# Patient Record
Sex: Male | Born: 1955 | Race: White | Hispanic: No | Marital: Married | Smoking: Former smoker
Health system: Southern US, Community
[De-identification: ages and names within clinical notes are randomized; demographics above are authoritative.]

## PROBLEM LIST (undated history)

## (undated) DIAGNOSIS — Z8739 Personal history of other diseases of the musculoskeletal system and connective tissue: Secondary | ICD-10-CM

## (undated) DIAGNOSIS — E7800 Pure hypercholesterolemia, unspecified: Secondary | ICD-10-CM

## (undated) DIAGNOSIS — I10 Essential (primary) hypertension: Secondary | ICD-10-CM

## (undated) DIAGNOSIS — G4730 Sleep apnea, unspecified: Secondary | ICD-10-CM

## (undated) DIAGNOSIS — D751 Secondary polycythemia: Secondary | ICD-10-CM

## (undated) DIAGNOSIS — I499 Cardiac arrhythmia, unspecified: Secondary | ICD-10-CM

## (undated) DIAGNOSIS — T7840XA Allergy, unspecified, initial encounter: Secondary | ICD-10-CM

## (undated) HISTORY — DX: Sleep apnea, unspecified: G47.30

## (undated) HISTORY — DX: Secondary polycythemia: D75.1

## (undated) HISTORY — DX: Personal history of other diseases of the musculoskeletal system and connective tissue: Z87.39

## (undated) HISTORY — DX: Essential (primary) hypertension: I10

## (undated) HISTORY — DX: Allergy, unspecified, initial encounter: T78.40XA

## (undated) HISTORY — DX: Pure hypercholesterolemia, unspecified: E78.00

---
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
## 2003-11-30 HISTORY — PX: MANDIBLE SURGERY: SHX707

## 2004-11-29 HISTORY — PX: COLONOSCOPY: SHX174

## 2006-05-19 DIAGNOSIS — E7800 Pure hypercholesterolemia, unspecified: Secondary | ICD-10-CM | POA: Insufficient documentation

## 2007-04-19 DIAGNOSIS — D751 Secondary polycythemia: Secondary | ICD-10-CM | POA: Insufficient documentation

## 2007-04-19 DIAGNOSIS — I10 Essential (primary) hypertension: Secondary | ICD-10-CM | POA: Insufficient documentation

## 2007-04-21 DIAGNOSIS — N529 Male erectile dysfunction, unspecified: Secondary | ICD-10-CM | POA: Insufficient documentation

## 2007-04-21 DIAGNOSIS — F17200 Nicotine dependence, unspecified, uncomplicated: Secondary | ICD-10-CM | POA: Insufficient documentation

## 2007-04-30 ENCOUNTER — Ambulatory Visit: Payer: Self-pay | Admitting: Internal Medicine

## 2007-05-02 ENCOUNTER — Ambulatory Visit: Payer: Self-pay | Admitting: Internal Medicine

## 2007-05-30 ENCOUNTER — Ambulatory Visit: Payer: Self-pay | Admitting: Internal Medicine

## 2007-06-30 ENCOUNTER — Ambulatory Visit: Payer: Self-pay | Admitting: Internal Medicine

## 2007-07-31 ENCOUNTER — Ambulatory Visit: Payer: Self-pay | Admitting: Internal Medicine

## 2007-08-30 ENCOUNTER — Ambulatory Visit: Payer: Self-pay | Admitting: Internal Medicine

## 2007-09-30 ENCOUNTER — Ambulatory Visit: Payer: Self-pay | Admitting: Internal Medicine

## 2007-10-30 ENCOUNTER — Ambulatory Visit: Payer: Self-pay | Admitting: Internal Medicine

## 2007-11-30 ENCOUNTER — Ambulatory Visit: Payer: Self-pay | Admitting: Internal Medicine

## 2007-12-31 ENCOUNTER — Ambulatory Visit: Payer: Self-pay | Admitting: Internal Medicine

## 2008-01-28 ENCOUNTER — Ambulatory Visit: Payer: Self-pay | Admitting: Internal Medicine

## 2008-02-28 ENCOUNTER — Ambulatory Visit: Payer: Self-pay | Admitting: Internal Medicine

## 2008-03-29 ENCOUNTER — Ambulatory Visit: Payer: Self-pay | Admitting: Internal Medicine

## 2008-04-29 ENCOUNTER — Ambulatory Visit: Payer: Self-pay | Admitting: Internal Medicine

## 2008-05-20 ENCOUNTER — Ambulatory Visit: Payer: Self-pay | Admitting: Internal Medicine

## 2008-05-29 ENCOUNTER — Ambulatory Visit: Payer: Self-pay | Admitting: Internal Medicine

## 2008-06-29 ENCOUNTER — Ambulatory Visit: Payer: Self-pay | Admitting: Internal Medicine

## 2008-07-01 ENCOUNTER — Ambulatory Visit: Payer: Self-pay | Admitting: Internal Medicine

## 2008-07-30 ENCOUNTER — Ambulatory Visit: Payer: Self-pay | Admitting: Internal Medicine

## 2008-08-27 ENCOUNTER — Ambulatory Visit: Payer: Self-pay | Admitting: Internal Medicine

## 2008-08-29 ENCOUNTER — Ambulatory Visit: Payer: Self-pay | Admitting: Nurse Practitioner

## 2008-08-29 ENCOUNTER — Ambulatory Visit: Payer: Self-pay | Admitting: Internal Medicine

## 2008-09-03 IMAGING — US ABDOMEN ULTRASOUND
1 series · 17 of 25 positions shown · non-contrast
Comparison: none

REASON FOR EXAM: polycythemia   cyst of spleen
COMMENTS:

[Series 1: abdomen ultrasound · 17 of 67 slices shown]
[im 1/67]
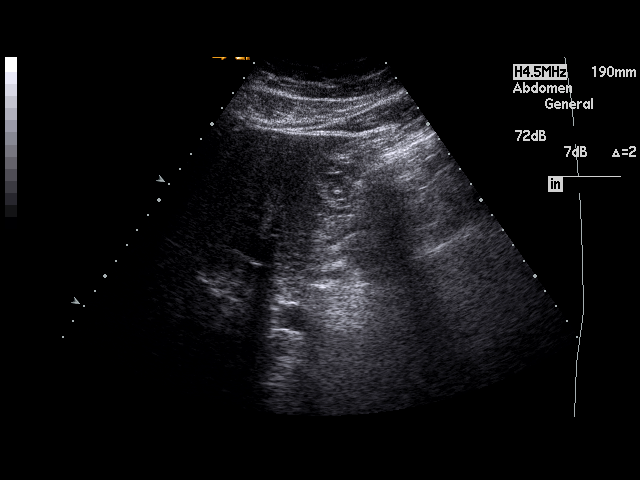
[im 6/67]
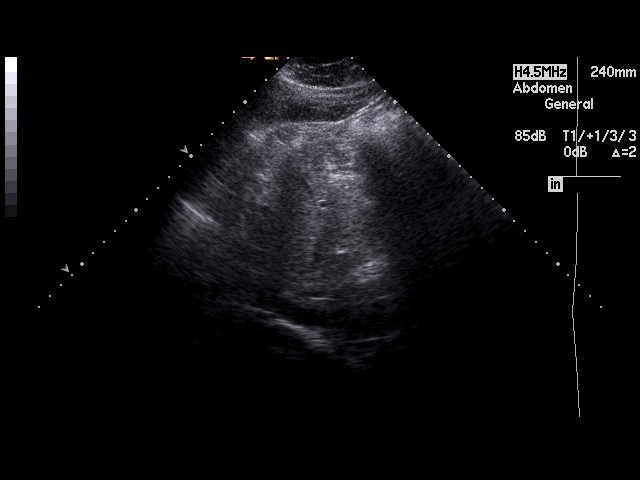
[im 9/67]
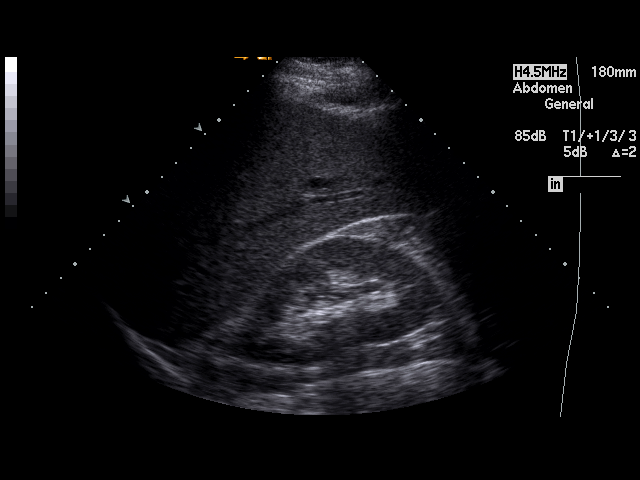
[im 14/67]
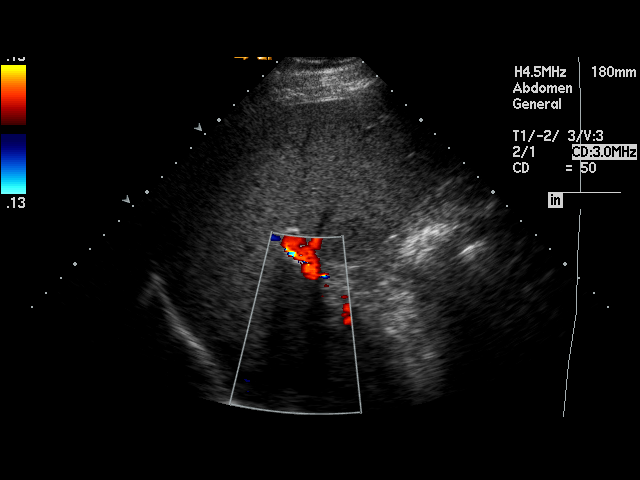
[im 17/67]
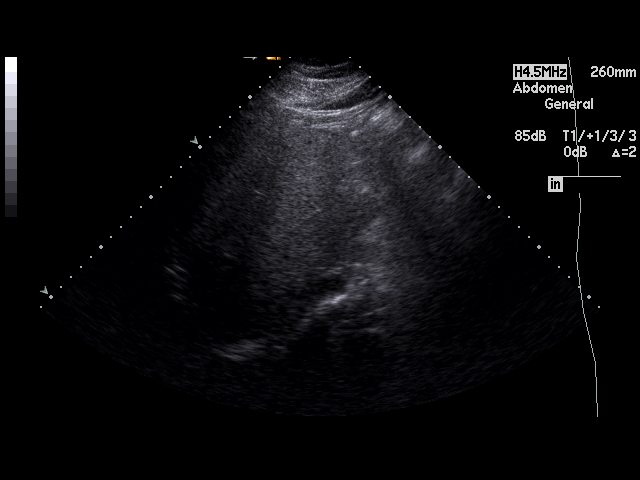
[im 23/67]
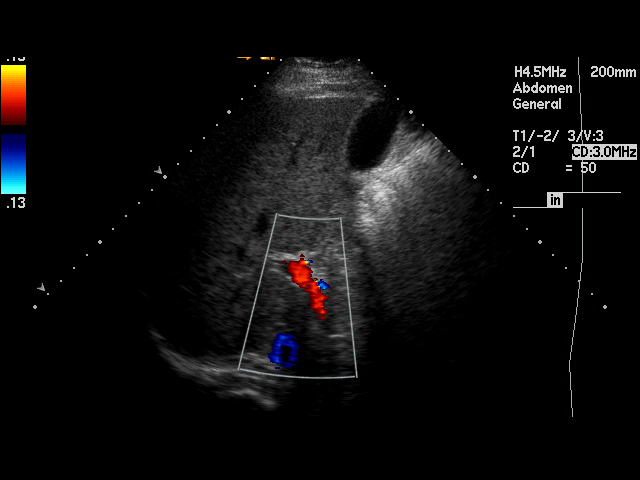
[im 25/67]
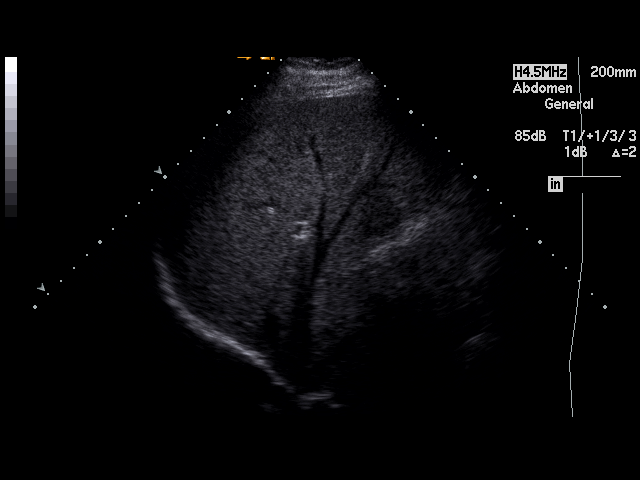
[im 31/67]
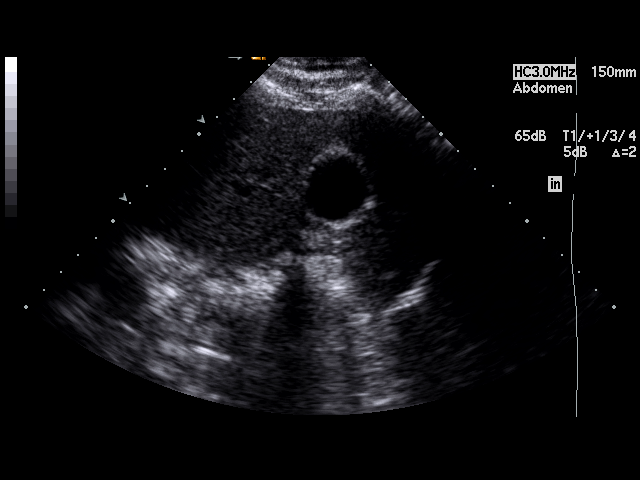
[im 34/67]
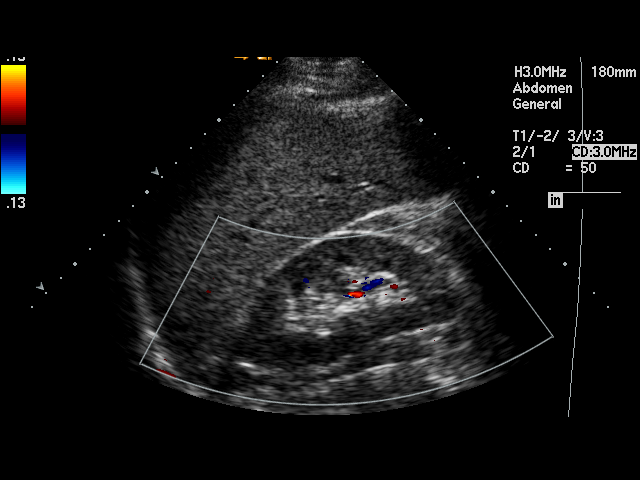
[im 36/67]
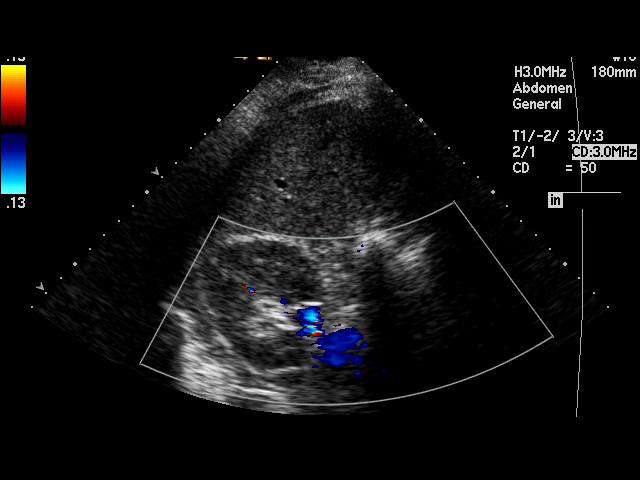
[im 42/67]
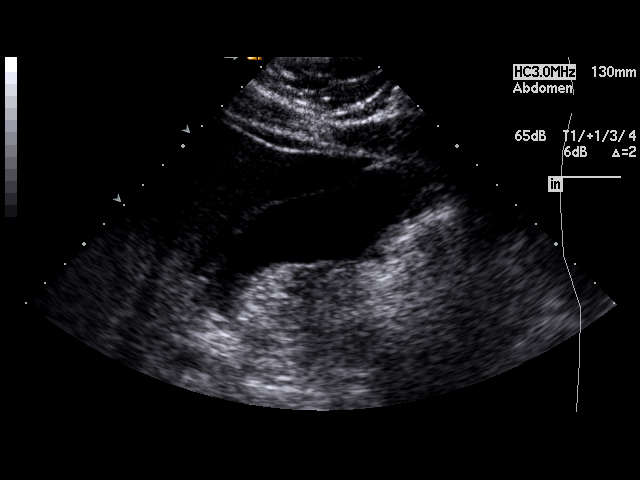
[im 45/67]
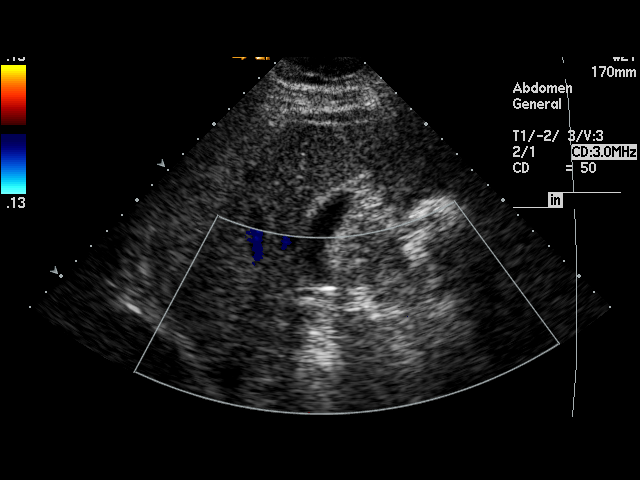
[im 50/67]
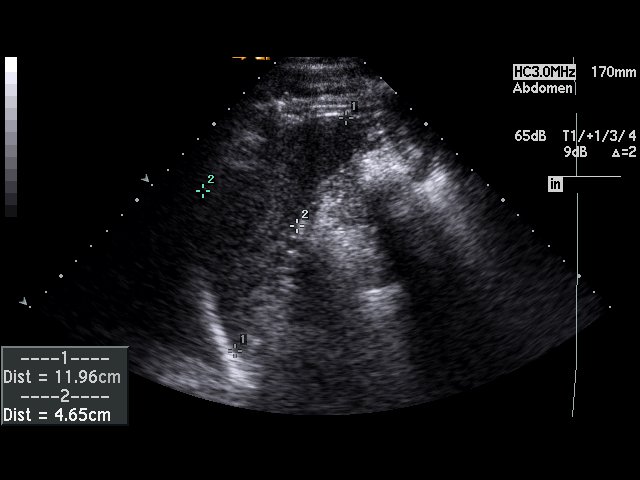
[im 53/67]
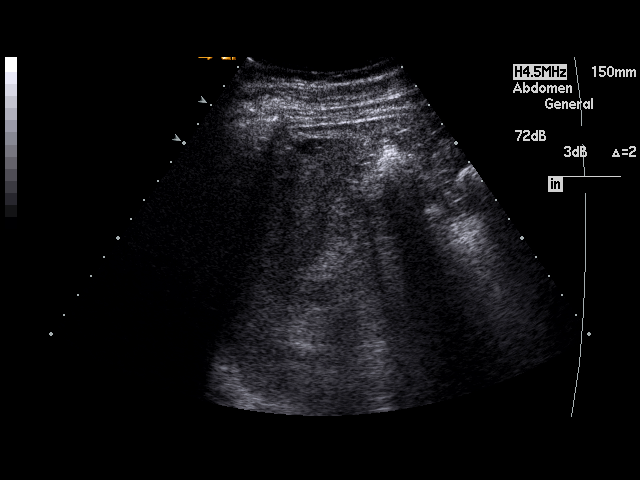
[im 58/67]
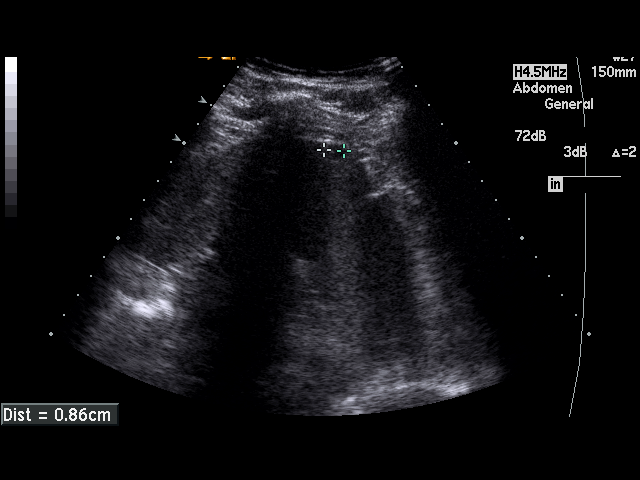
[im 61/67]
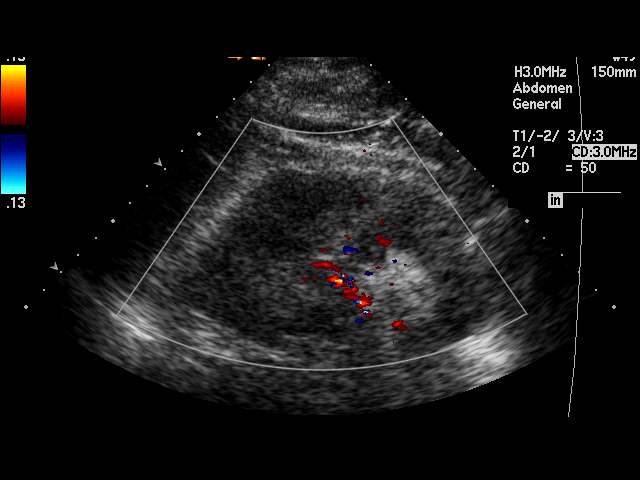
[im 67/67]
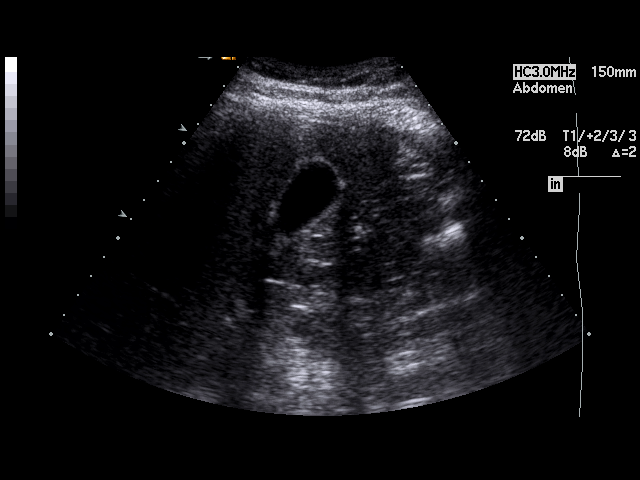

[17 of 25 positions shown; findings below may reference images not displayed]

PROCEDURE:     US  - US ABDOMEN GENERAL SURVEY  - December 21, 2007  [DATE]

RESULT:     The liver, inferior vena cava and abdominal aorta show no
significant abnormalities. The spleen is within normal limits for size. On
this exam the spleen measures 11.96 cm at maximum AP diameter. A 1 cm
splenic cyst is again seen. No gallstones are noted. There is no thickening
of the gallbladder wall. The common bile duct measures 4.2 mm in diameter
which is within normal limits.  The kidneys show no hydronephrosis. There is
no ascites.
IMPRESSION: 1. Normal study except for an incidentally noted stable, 1 cm splenic cyst.
2. The pancreas is not visualized adequately for evaluation on this exam.
3. Spleen size is normal.
4. No gallstones are noted.

## 2008-09-29 ENCOUNTER — Ambulatory Visit: Payer: Self-pay | Admitting: Nurse Practitioner

## 2008-09-29 ENCOUNTER — Ambulatory Visit: Payer: Self-pay | Admitting: Internal Medicine

## 2008-10-29 ENCOUNTER — Ambulatory Visit: Payer: Self-pay | Admitting: Internal Medicine

## 2008-11-29 ENCOUNTER — Ambulatory Visit: Payer: Self-pay | Admitting: Internal Medicine

## 2008-12-30 ENCOUNTER — Ambulatory Visit: Payer: Self-pay | Admitting: Internal Medicine

## 2009-01-27 ENCOUNTER — Ambulatory Visit: Payer: Self-pay | Admitting: Internal Medicine

## 2009-02-17 DIAGNOSIS — N400 Enlarged prostate without lower urinary tract symptoms: Secondary | ICD-10-CM | POA: Insufficient documentation

## 2009-02-17 DIAGNOSIS — J301 Allergic rhinitis due to pollen: Secondary | ICD-10-CM | POA: Insufficient documentation

## 2009-02-27 ENCOUNTER — Ambulatory Visit: Payer: Self-pay | Admitting: Internal Medicine

## 2009-03-29 ENCOUNTER — Ambulatory Visit: Payer: Self-pay | Admitting: Internal Medicine

## 2009-05-29 ENCOUNTER — Ambulatory Visit: Payer: Self-pay | Admitting: Internal Medicine

## 2009-06-12 ENCOUNTER — Ambulatory Visit: Payer: Self-pay | Admitting: Internal Medicine

## 2009-06-29 ENCOUNTER — Ambulatory Visit: Payer: Self-pay | Admitting: Internal Medicine

## 2009-07-30 ENCOUNTER — Ambulatory Visit: Payer: Self-pay | Admitting: Internal Medicine

## 2009-08-29 ENCOUNTER — Ambulatory Visit: Payer: Self-pay | Admitting: Internal Medicine

## 2009-09-04 ENCOUNTER — Ambulatory Visit: Payer: Self-pay | Admitting: Internal Medicine

## 2009-09-29 ENCOUNTER — Ambulatory Visit: Payer: Self-pay | Admitting: Internal Medicine

## 2009-10-29 ENCOUNTER — Ambulatory Visit: Payer: Self-pay | Admitting: Internal Medicine

## 2009-11-29 ENCOUNTER — Ambulatory Visit: Payer: Self-pay | Admitting: Internal Medicine

## 2009-12-15 ENCOUNTER — Ambulatory Visit: Payer: Self-pay | Admitting: Internal Medicine

## 2009-12-30 ENCOUNTER — Ambulatory Visit: Payer: Self-pay | Admitting: Internal Medicine

## 2010-01-30 ENCOUNTER — Ambulatory Visit: Payer: Self-pay | Admitting: Internal Medicine

## 2010-02-27 ENCOUNTER — Ambulatory Visit: Payer: Self-pay | Admitting: Internal Medicine

## 2010-03-29 ENCOUNTER — Ambulatory Visit: Payer: Self-pay | Admitting: Internal Medicine

## 2010-05-19 ENCOUNTER — Ambulatory Visit: Payer: Self-pay | Admitting: Internal Medicine

## 2010-05-29 ENCOUNTER — Ambulatory Visit: Payer: Self-pay | Admitting: Internal Medicine

## 2010-07-14 ENCOUNTER — Ambulatory Visit: Payer: Self-pay | Admitting: Internal Medicine

## 2010-07-30 ENCOUNTER — Ambulatory Visit: Payer: Self-pay | Admitting: Internal Medicine

## 2010-08-29 ENCOUNTER — Ambulatory Visit: Payer: Self-pay | Admitting: Internal Medicine

## 2010-09-29 ENCOUNTER — Ambulatory Visit: Payer: Self-pay | Admitting: Internal Medicine

## 2010-10-29 ENCOUNTER — Ambulatory Visit: Payer: Self-pay | Admitting: Internal Medicine

## 2010-11-29 ENCOUNTER — Ambulatory Visit: Payer: Self-pay | Admitting: Internal Medicine

## 2010-12-30 ENCOUNTER — Ambulatory Visit: Payer: Self-pay | Admitting: Internal Medicine

## 2011-01-28 ENCOUNTER — Ambulatory Visit: Payer: Self-pay | Admitting: Internal Medicine

## 2011-02-28 ENCOUNTER — Ambulatory Visit: Payer: Self-pay | Admitting: Internal Medicine

## 2011-03-29 LAB — PSA: PSA: 0.8

## 2011-03-30 ENCOUNTER — Ambulatory Visit: Payer: Self-pay | Admitting: Internal Medicine

## 2011-04-30 ENCOUNTER — Ambulatory Visit: Payer: Self-pay | Admitting: Internal Medicine

## 2011-05-30 ENCOUNTER — Ambulatory Visit: Payer: Self-pay | Admitting: Internal Medicine

## 2011-06-30 ENCOUNTER — Ambulatory Visit: Payer: Self-pay | Admitting: Internal Medicine

## 2011-07-31 ENCOUNTER — Ambulatory Visit: Payer: Self-pay | Admitting: Internal Medicine

## 2011-08-30 ENCOUNTER — Ambulatory Visit: Payer: Self-pay | Admitting: Internal Medicine

## 2011-09-30 ENCOUNTER — Ambulatory Visit: Payer: Self-pay | Admitting: Internal Medicine

## 2011-10-30 ENCOUNTER — Ambulatory Visit: Payer: Self-pay | Admitting: Internal Medicine

## 2011-11-30 ENCOUNTER — Ambulatory Visit: Payer: Self-pay | Admitting: Internal Medicine

## 2011-12-10 LAB — CANCER CENTER HEMATOCRIT: HCT: 48.9 % (ref 40.0–52.0)

## 2011-12-31 ENCOUNTER — Ambulatory Visit: Payer: Self-pay | Admitting: Internal Medicine

## 2011-12-31 LAB — CBC CANCER CENTER
Eosinophil #: 0.2 x10 3/mm (ref 0.0–0.7)
Eosinophil %: 1.8 %
HGB: 16.5 g/dL (ref 13.0–18.0)
Lymphocyte #: 2.3 x10 3/mm (ref 1.0–3.6)
MCH: 28.6 pg (ref 26.0–34.0)
MCV: 87 fL (ref 80–100)
Monocyte #: 0.6 x10 3/mm (ref 0.0–0.7)
Neutrophil #: 5.9 x10 3/mm (ref 1.4–6.5)
Neutrophil %: 65 %
Platelet: 162 x10 3/mm (ref 150–440)
RBC: 5.78 10*6/uL (ref 4.40–5.90)
RDW: 14.4 % (ref 11.5–14.5)

## 2012-01-28 ENCOUNTER — Ambulatory Visit: Payer: Self-pay | Admitting: Internal Medicine

## 2012-02-28 ENCOUNTER — Ambulatory Visit: Payer: Self-pay | Admitting: Internal Medicine

## 2012-03-17 LAB — CBC CANCER CENTER
Eosinophil #: 0.1 x10 3/mm (ref 0.0–0.7)
Lymphocyte %: 23.1 %
MCH: 26.9 pg (ref 26.0–34.0)
MCHC: 31.4 g/dL — ABNORMAL LOW (ref 32.0–36.0)
MCV: 86 fL (ref 80–100)
Monocyte #: 0.7 x10 3/mm (ref 0.2–1.0)
Neutrophil #: 5.3 x10 3/mm (ref 1.4–6.5)
Platelet: 145 x10 3/mm — ABNORMAL LOW (ref 150–440)
RBC: 5.88 10*6/uL (ref 4.40–5.90)
RDW: 15.6 % — ABNORMAL HIGH (ref 11.5–14.5)
WBC: 8.1 x10 3/mm (ref 3.8–10.6)

## 2012-03-29 ENCOUNTER — Ambulatory Visit: Payer: Self-pay | Admitting: Internal Medicine

## 2012-04-05 LAB — CANCER CENTER HEMATOCRIT: HCT: 47.3 % (ref 40.0–52.0)

## 2012-04-29 ENCOUNTER — Ambulatory Visit: Payer: Self-pay | Admitting: Internal Medicine

## 2012-05-03 LAB — CBC CANCER CENTER
Basophil #: 0.1 x10 3/mm (ref 0.0–0.1)
HGB: 15.7 g/dL (ref 13.0–18.0)
MCH: 27 pg (ref 26.0–34.0)
MCHC: 32 g/dL (ref 32.0–36.0)
MCV: 84 fL (ref 80–100)
Monocyte #: 0.8 x10 3/mm (ref 0.2–1.0)
Neutrophil %: 67.6 %
Platelet: 158 x10 3/mm (ref 150–440)
RBC: 5.83 10*6/uL (ref 4.40–5.90)

## 2012-05-29 ENCOUNTER — Ambulatory Visit: Payer: Self-pay | Admitting: Internal Medicine

## 2012-06-29 ENCOUNTER — Ambulatory Visit: Payer: Self-pay | Admitting: Internal Medicine

## 2012-07-30 ENCOUNTER — Ambulatory Visit: Payer: Self-pay | Admitting: Internal Medicine

## 2012-08-18 ENCOUNTER — Ambulatory Visit: Payer: Self-pay | Admitting: Family Medicine

## 2012-08-18 LAB — COMPREHENSIVE METABOLIC PANEL
Anion Gap: 7 (ref 7–16)
BUN: 11 mg/dL (ref 7–18)
Bilirubin,Total: 0.5 mg/dL (ref 0.2–1.0)
Chloride: 101 mmol/L (ref 98–107)
Co2: 30 mmol/L (ref 21–32)
Creatinine: 0.83 mg/dL (ref 0.60–1.30)
EGFR (African American): >60
EGFR (Non-African Amer.): >60
Osmolality: 275 (ref 275–301)
Potassium: 4.3 mmol/L (ref 3.5–5.1)
SGPT (ALT): 19 U/L (ref 12–78)
Sodium: 138 mmol/L (ref 136–145)

## 2012-08-18 LAB — CBC WITH DIFFERENTIAL/PLATELET
Basophil #: 0 10*3/uL (ref 0.0–0.1)
HCT: 47.1 % (ref 40.0–52.0)
HGB: 15.4 g/dL (ref 13.0–18.0)
Lymphocyte #: 1.7 10*3/uL (ref 1.0–3.6)
Lymphocyte %: 11.5 %
MCV: 84 fL (ref 80–100)
Monocyte %: 7.3 %
Neutrophil %: 79.8 %
Platelet: 180 10*3/uL (ref 150–440)
RDW: 15.8 % — ABNORMAL HIGH (ref 11.5–14.5)
WBC: 14.9 10*3/uL — ABNORMAL HIGH (ref 3.8–10.6)

## 2012-09-01 ENCOUNTER — Ambulatory Visit: Payer: Self-pay | Admitting: Internal Medicine

## 2012-09-01 LAB — CANCER CENTER HEMATOCRIT: HCT: 48.6 % (ref 40.0–52.0)

## 2012-09-20 LAB — CANCER CENTER HEMATOCRIT: HCT: 46.7 % (ref 40.0–52.0)

## 2012-09-29 ENCOUNTER — Ambulatory Visit: Payer: Self-pay

## 2012-09-29 ENCOUNTER — Ambulatory Visit: Payer: Self-pay | Admitting: Internal Medicine

## 2012-10-18 LAB — CBC CANCER CENTER
Eosinophil #: 0.2 x10 3/mm (ref 0.0–0.7)
Eosinophil %: 1.8 %
HCT: 48.1 % (ref 40.0–52.0)
Lymphocyte #: 2 x10 3/mm (ref 1.0–3.6)
Lymphocyte %: 22.1 %
MCH: 26.6 pg (ref 26.0–34.0)
MCHC: 31.1 g/dL — ABNORMAL LOW (ref 32.0–36.0)
MCV: 86 fL (ref 80–100)
Monocyte #: 0.6 x10 3/mm (ref 0.2–1.0)
RBC: 5.62 10*6/uL (ref 4.40–5.90)
RDW: 15.8 % — ABNORMAL HIGH (ref 11.5–14.5)
WBC: 8.8 x10 3/mm (ref 3.8–10.6)

## 2012-10-29 ENCOUNTER — Ambulatory Visit: Payer: Self-pay | Admitting: Internal Medicine

## 2012-11-20 LAB — CBC CANCER CENTER
Basophil #: 0.1 x10 3/mm (ref 0.0–0.1)
HCT: 45.5 % (ref 40.0–52.0)
Lymphocyte %: 30.9 %
Monocyte %: 15 %
Platelet: 133 x10 3/mm — ABNORMAL LOW (ref 150–440)
RBC: 5.51 10*6/uL (ref 4.40–5.90)
RDW: 15.7 % — ABNORMAL HIGH (ref 11.5–14.5)

## 2012-11-29 ENCOUNTER — Ambulatory Visit: Payer: Self-pay | Admitting: Internal Medicine

## 2012-12-20 LAB — CBC CANCER CENTER
Eosinophil #: 0.2 x10 3/mm (ref 0.0–0.7)
HCT: 49.8 % (ref 40.0–52.0)
HGB: 16.3 g/dL (ref 13.0–18.0)
Lymphocyte %: 19.2 %
MCH: 26.8 pg (ref 26.0–34.0)
MCHC: 32.6 g/dL (ref 32.0–36.0)
MCV: 82 fL (ref 80–100)
Neutrophil %: 67.4 %
Platelet: 146 x10 3/mm — ABNORMAL LOW (ref 150–440)
RBC: 6.06 10*6/uL — ABNORMAL HIGH (ref 4.40–5.90)
WBC: 10.7 x10 3/mm — ABNORMAL HIGH (ref 3.8–10.6)

## 2012-12-30 ENCOUNTER — Ambulatory Visit: Payer: Self-pay | Admitting: Internal Medicine

## 2013-01-15 LAB — CBC CANCER CENTER
Basophil #: 0.1 x10 3/mm (ref 0.0–0.1)
Basophil %: 1.1 %
HCT: 48.3 % (ref 40.0–52.0)
HGB: 15.8 g/dL (ref 13.0–18.0)
Lymphocyte #: 1.9 x10 3/mm (ref 1.0–3.6)
Lymphocyte %: 20.5 %
MCHC: 32.7 g/dL (ref 32.0–36.0)
MCV: 83 fL (ref 80–100)
Monocyte #: 0.7 x10 3/mm (ref 0.2–1.0)
Neutrophil #: 6.4 x10 3/mm (ref 1.4–6.5)
Neutrophil %: 69.4 %
RDW: 16.3 % — ABNORMAL HIGH (ref 11.5–14.5)
WBC: 9.1 x10 3/mm (ref 3.8–10.6)

## 2013-01-27 ENCOUNTER — Ambulatory Visit: Payer: Self-pay | Admitting: Internal Medicine

## 2013-02-26 LAB — CANCER CENTER HEMATOCRIT: HCT: 51.4 % (ref 40.0–52.0)

## 2013-02-27 ENCOUNTER — Ambulatory Visit: Payer: Self-pay | Admitting: Internal Medicine

## 2013-02-27 LAB — CANCER CTR PLATELET CT: Platelet: 177 x10 3/mm (ref 150–440)

## 2013-03-29 ENCOUNTER — Ambulatory Visit: Payer: Self-pay | Admitting: Internal Medicine

## 2013-04-26 LAB — CANCER CENTER HEMATOCRIT: HCT: 48.6 % (ref 40.0–52.0)

## 2013-04-29 ENCOUNTER — Ambulatory Visit: Payer: Self-pay | Admitting: Internal Medicine

## 2013-05-29 ENCOUNTER — Ambulatory Visit: Payer: Self-pay | Admitting: Internal Medicine

## 2013-06-11 LAB — CBC CANCER CENTER
Basophil %: 1.1 %
Eosinophil #: 0.2 x10 3/mm (ref 0.0–0.7)
Eosinophil %: 1.6 %
HCT: 48.2 % (ref 40.0–52.0)
HGB: 15.9 g/dL (ref 13.0–18.0)
Lymphocyte #: 2.1 x10 3/mm (ref 1.0–3.6)
MCH: 27.3 pg (ref 26.0–34.0)
MCHC: 33 g/dL (ref 32.0–36.0)
Monocyte %: 9.3 %
Neutrophil %: 66.4 %
Platelet: 180 x10 3/mm (ref 150–440)
RBC: 5.83 10*6/uL (ref 4.40–5.90)

## 2013-06-29 ENCOUNTER — Ambulatory Visit: Payer: Self-pay | Admitting: Internal Medicine

## 2013-07-11 LAB — CANCER CENTER HEMATOCRIT: HCT: 47.4 % (ref 40.0–52.0)

## 2013-07-30 ENCOUNTER — Ambulatory Visit: Payer: Self-pay | Admitting: Internal Medicine

## 2013-08-22 LAB — CANCER CENTER HEMATOCRIT: HCT: 46.5 % (ref 40.0–52.0)

## 2013-08-29 ENCOUNTER — Ambulatory Visit: Payer: Self-pay | Admitting: Internal Medicine

## 2013-09-19 LAB — CANCER CENTER HEMATOCRIT: HCT: 48.7 % (ref 40.0–52.0)

## 2013-09-29 ENCOUNTER — Ambulatory Visit: Payer: Self-pay | Admitting: Internal Medicine

## 2013-10-31 ENCOUNTER — Ambulatory Visit: Payer: Self-pay | Admitting: Internal Medicine

## 2013-10-31 LAB — CANCER CENTER HEMATOCRIT: HCT: 48.3 % (ref 40.0–52.0)

## 2013-11-29 ENCOUNTER — Ambulatory Visit: Payer: Self-pay | Admitting: Internal Medicine

## 2013-11-29 HISTORY — PX: CATARACT EXTRACTION: SUR2

## 2013-12-05 LAB — CBC CANCER CENTER
BASOS PCT: 1.2 %
Basophil #: 0.1 x10 3/mm (ref 0.0–0.1)
Eosinophil #: 0.2 x10 3/mm (ref 0.0–0.7)
Eosinophil %: 2.2 %
HCT: 48 % (ref 40.0–52.0)
HGB: 15.3 g/dL (ref 13.0–18.0)
Lymphocyte #: 2 x10 3/mm (ref 1.0–3.6)
Lymphocyte %: 23.7 %
MCH: 26.4 pg (ref 26.0–34.0)
MCHC: 31.9 g/dL — AB (ref 32.0–36.0)
MCV: 83 fL (ref 80–100)
MONOS PCT: 10 %
Monocyte #: 0.8 x10 3/mm (ref 0.2–1.0)
NEUTROS PCT: 62.9 %
Neutrophil #: 5.2 x10 3/mm (ref 1.4–6.5)
PLATELETS: 153 x10 3/mm (ref 150–440)
RBC: 5.8 10*6/uL (ref 4.40–5.90)
RDW: 16.1 % — ABNORMAL HIGH (ref 11.5–14.5)
WBC: 8.3 x10 3/mm (ref 3.8–10.6)

## 2013-12-30 ENCOUNTER — Ambulatory Visit: Payer: Self-pay | Admitting: Internal Medicine

## 2014-01-03 LAB — CANCER CENTER HEMATOCRIT: HCT: 50.8 % (ref 40.0–52.0)

## 2014-01-27 ENCOUNTER — Ambulatory Visit: Payer: Self-pay | Admitting: Internal Medicine

## 2014-02-08 LAB — CBC CANCER CENTER
BASOS ABS: 0.1 x10 3/mm (ref 0.0–0.1)
BASOS PCT: 0.9 %
EOS ABS: 0.2 x10 3/mm (ref 0.0–0.7)
Eosinophil %: 2.3 %
HCT: 48 % (ref 40.0–52.0)
HGB: 15.2 g/dL (ref 13.0–18.0)
LYMPHS ABS: 1.9 x10 3/mm (ref 1.0–3.6)
Lymphocyte %: 20.8 %
MCH: 26.5 pg (ref 26.0–34.0)
MCHC: 31.6 g/dL — ABNORMAL LOW (ref 32.0–36.0)
MCV: 84 fL (ref 80–100)
MONO ABS: 0.6 x10 3/mm (ref 0.2–1.0)
MONOS PCT: 6.8 %
NEUTROS PCT: 69.2 %
Neutrophil #: 6.2 x10 3/mm (ref 1.4–6.5)
PLATELETS: 141 x10 3/mm — AB (ref 150–440)
RBC: 5.72 10*6/uL (ref 4.40–5.90)
RDW: 16 % — ABNORMAL HIGH (ref 11.5–14.5)
WBC: 8.9 x10 3/mm (ref 3.8–10.6)

## 2014-02-27 ENCOUNTER — Ambulatory Visit: Payer: Self-pay | Admitting: Internal Medicine

## 2014-03-08 LAB — CANCER CTR PLATELET CT: Platelet: 130 x10 3/mm — ABNORMAL LOW (ref 150–440)

## 2014-03-08 LAB — CANCER CENTER HEMATOCRIT: HCT: 46.8 % (ref 40.0–52.0)

## 2014-03-29 ENCOUNTER — Ambulatory Visit: Payer: Self-pay | Admitting: Internal Medicine

## 2014-04-18 LAB — CANCER CENTER HEMATOCRIT: HCT: 46.9 % (ref 40.0–52.0)

## 2014-04-29 ENCOUNTER — Ambulatory Visit: Payer: Self-pay | Admitting: Internal Medicine

## 2014-06-03 ENCOUNTER — Ambulatory Visit: Payer: Self-pay | Admitting: Internal Medicine

## 2014-06-04 LAB — CBC CANCER CENTER
Basophil #: 0.1 x10 3/mm (ref 0.0–0.1)
Basophil %: 1 %
EOS PCT: 1.9 %
Eosinophil #: 0.2 x10 3/mm (ref 0.0–0.7)
HCT: 50.4 % (ref 40.0–52.0)
HGB: 16.3 g/dL (ref 13.0–18.0)
Lymphocyte #: 2.2 x10 3/mm (ref 1.0–3.6)
Lymphocyte %: 25.2 %
MCH: 27.9 pg (ref 26.0–34.0)
MCHC: 32.4 g/dL (ref 32.0–36.0)
MCV: 86 fL (ref 80–100)
Monocyte #: 0.8 x10 3/mm (ref 0.2–1.0)
Monocyte %: 8.8 %
NEUTROS ABS: 5.6 x10 3/mm (ref 1.4–6.5)
NEUTROS PCT: 63.1 %
Platelet: 136 x10 3/mm — ABNORMAL LOW (ref 150–440)
RBC: 5.86 10*6/uL (ref 4.40–5.90)
RDW: 18 % — AB (ref 11.5–14.5)
WBC: 8.9 x10 3/mm (ref 3.8–10.6)

## 2014-06-29 ENCOUNTER — Ambulatory Visit: Payer: Self-pay | Admitting: Internal Medicine

## 2014-07-04 LAB — CANCER CENTER HEMATOCRIT: HCT: 49.9 % (ref 40.0–52.0)

## 2014-07-18 ENCOUNTER — Encounter: Payer: Self-pay | Admitting: General Surgery

## 2014-07-22 ENCOUNTER — Ambulatory Visit: Payer: Self-pay | Admitting: General Surgery

## 2014-07-26 ENCOUNTER — Encounter: Payer: Self-pay | Admitting: General Surgery

## 2014-07-30 ENCOUNTER — Ambulatory Visit: Payer: Self-pay | Admitting: Internal Medicine

## 2014-07-30 LAB — CANCER CENTER HEMATOCRIT: HCT: 50.8 % (ref 40.0–52.0)

## 2014-08-22 LAB — CBC CANCER CENTER
BASOS ABS: 0.1 x10 3/mm (ref 0.0–0.1)
Basophil %: 1 %
EOS PCT: 2.2 %
Eosinophil #: 0.2 x10 3/mm (ref 0.0–0.7)
HCT: 49.9 % (ref 40.0–52.0)
HGB: 15.9 g/dL (ref 13.0–18.0)
LYMPHS ABS: 1.9 x10 3/mm (ref 1.0–3.6)
LYMPHS PCT: 22.7 %
MCH: 27.8 pg (ref 26.0–34.0)
MCHC: 31.9 g/dL — ABNORMAL LOW (ref 32.0–36.0)
MCV: 87 fL (ref 80–100)
Monocyte #: 0.8 x10 3/mm (ref 0.2–1.0)
Monocyte %: 9.4 %
NEUTROS PCT: 64.7 %
Neutrophil #: 5.4 x10 3/mm (ref 1.4–6.5)
PLATELETS: 149 x10 3/mm — AB (ref 150–440)
RBC: 5.73 10*6/uL (ref 4.40–5.90)
RDW: 14.9 % — ABNORMAL HIGH (ref 11.5–14.5)
WBC: 8.4 x10 3/mm (ref 3.8–10.6)

## 2014-08-29 ENCOUNTER — Ambulatory Visit: Payer: Self-pay | Admitting: Internal Medicine

## 2014-09-25 ENCOUNTER — Encounter: Payer: Self-pay | Admitting: *Deleted

## 2014-10-04 ENCOUNTER — Ambulatory Visit: Payer: Self-pay | Admitting: Internal Medicine

## 2014-10-04 LAB — CANCER CTR PLATELET CT: Platelet: 164 x10 3/mm (ref 150–440)

## 2014-10-04 LAB — CANCER CENTER HEMATOCRIT: HCT: 49.5 % (ref 40.0–52.0)

## 2014-10-29 ENCOUNTER — Ambulatory Visit: Payer: Self-pay | Admitting: Internal Medicine

## 2014-11-07 LAB — CANCER CENTER HEMATOCRIT: HCT: 47.8 % (ref 40.0–52.0)

## 2014-11-29 ENCOUNTER — Ambulatory Visit: Payer: Self-pay | Admitting: Internal Medicine

## 2014-12-09 LAB — CBC CANCER CENTER
BASOS ABS: 0.4 x10 3/mm — AB (ref 0.0–0.1)
BASOS PCT: 3.6 %
EOS PCT: 1.4 %
Eosinophil #: 0.2 x10 3/mm (ref 0.0–0.7)
HCT: 47.3 % (ref 40.0–52.0)
HGB: 15 g/dL (ref 13.0–18.0)
LYMPHS PCT: 9.9 %
Lymphocyte #: 1.2 x10 3/mm (ref 1.0–3.6)
MCH: 25.8 pg — ABNORMAL LOW (ref 26.0–34.0)
MCHC: 31.8 g/dL — AB (ref 32.0–36.0)
MCV: 81 fL (ref 80–100)
MONOS PCT: 7.8 %
Monocyte #: 0.9 x10 3/mm (ref 0.2–1.0)
NEUTROS ABS: 9.3 x10 3/mm — AB (ref 1.4–6.5)
Neutrophil %: 77.3 %
Platelet: 156 x10 3/mm (ref 150–440)
RBC: 5.84 10*6/uL (ref 4.40–5.90)
RDW: 16.2 % — ABNORMAL HIGH (ref 11.5–14.5)
WBC: 12 x10 3/mm — ABNORMAL HIGH (ref 3.8–10.6)

## 2014-12-30 ENCOUNTER — Ambulatory Visit: Payer: Self-pay | Admitting: Internal Medicine

## 2015-01-15 LAB — BASIC METABOLIC PANEL
BUN: 15 mg/dL (ref 4–21)
CREATININE: 1 mg/dL (ref 0.6–1.3)
Glucose: 98 mg/dL
Potassium: 4.8 mmol/L (ref 3.4–5.3)
Sodium: 140 mmol/L (ref 137–147)

## 2015-01-15 LAB — LIPID PANEL
CHOLESTEROL: 164 mg/dL (ref 0–200)
HDL: 46 mg/dL (ref 35–70)
LDL Cholesterol: 101 mg/dL
TRIGLYCERIDES: 87 mg/dL (ref 40–160)

## 2015-01-15 LAB — TSH: TSH: 1.7 u[IU]/mL (ref 0.41–5.90)

## 2015-01-15 LAB — HEPATIC FUNCTION PANEL
ALT: 20 U/L (ref 10–40)
AST: 20 U/L (ref 14–40)

## 2015-01-15 LAB — CBC AND DIFFERENTIAL: WBC: 8.8 10*3/mL

## 2015-01-28 ENCOUNTER — Ambulatory Visit
Admit: 2015-01-28 | Disposition: A | Discharge status: Home / Self Care | Payer: Self-pay | Attending: Internal Medicine | Admitting: Internal Medicine

## 2015-02-28 ENCOUNTER — Ambulatory Visit
Admit: 2015-02-28 | Disposition: A | Discharge status: Home / Self Care | Payer: Self-pay | Attending: Counselor | Admitting: Counselor

## 2015-02-28 ENCOUNTER — Ambulatory Visit
Admit: 2015-02-28 | Disposition: A | Discharge status: Home / Self Care | Payer: Self-pay | Attending: Internal Medicine | Admitting: Internal Medicine

## 2015-03-13 LAB — CBC CANCER CENTER
Basophil #: 0.1 x10 3/mm (ref 0.0–0.1)
Basophil %: 0.8 %
EOS PCT: 2.6 %
Eosinophil #: 0.2 x10 3/mm (ref 0.0–0.7)
HCT: 46.9 % (ref 40.0–52.0)
HGB: 15 g/dL (ref 13.0–18.0)
LYMPHS PCT: 17.4 %
Lymphocyte #: 1.6 x10 3/mm (ref 1.0–3.6)
MCH: 26.5 pg (ref 26.0–34.0)
MCHC: 32 g/dL (ref 32.0–36.0)
MCV: 83 fL (ref 80–100)
MONO ABS: 0.8 x10 3/mm (ref 0.2–1.0)
Monocyte %: 8.4 %
NEUTROS PCT: 70.8 %
Neutrophil #: 6.5 x10 3/mm (ref 1.4–6.5)
PLATELETS: 135 x10 3/mm — AB (ref 150–440)
RBC: 5.66 10*6/uL (ref 4.40–5.90)
RDW: 16.4 % — ABNORMAL HIGH (ref 11.5–14.5)
WBC: 9.2 x10 3/mm (ref 3.8–10.6)

## 2015-03-26 NOTE — Progress Notes (Signed)
Data collected 02/05/15 at 1543

## 2015-04-09 ENCOUNTER — Inpatient Hospital Stay: Payer: BLUE CROSS/BLUE SHIELD | Attending: Internal Medicine

## 2015-04-09 ENCOUNTER — Inpatient Hospital Stay: Payer: BLUE CROSS/BLUE SHIELD | Admitting: Internal Medicine

## 2015-04-09 ENCOUNTER — Inpatient Hospital Stay: Payer: BLUE CROSS/BLUE SHIELD

## 2015-04-09 VITALS — BP 165/90 | HR 72

## 2015-04-09 DIAGNOSIS — D751 Secondary polycythemia: Secondary | ICD-10-CM

## 2015-04-09 DIAGNOSIS — D72829 Elevated white blood cell count, unspecified: Secondary | ICD-10-CM | POA: Diagnosis not present

## 2015-04-09 DIAGNOSIS — D45 Polycythemia vera: Secondary | ICD-10-CM | POA: Diagnosis not present

## 2015-04-09 DIAGNOSIS — D696 Thrombocytopenia, unspecified: Secondary | ICD-10-CM | POA: Diagnosis not present

## 2015-04-09 LAB — CBC WITH DIFFERENTIAL/PLATELET
Basophils Absolute: 0.1 10*3/uL (ref 0–0.1)
Basophils Relative: 1 %
EOS ABS: 0.2 10*3/uL (ref 0–0.7)
Eosinophils Relative: 3 %
HCT: 49.6 % (ref 40.0–52.0)
HEMOGLOBIN: 15.9 g/dL (ref 13.0–18.0)
LYMPHS ABS: 1.5 10*3/uL (ref 1.0–3.6)
Lymphocytes Relative: 19 %
MCH: 26.7 pg (ref 26.0–34.0)
MCHC: 32 g/dL (ref 32.0–36.0)
MCV: 83.3 fL (ref 80.0–100.0)
MONO ABS: 0.7 10*3/uL (ref 0.2–1.0)
MONOS PCT: 9 %
NEUTROS PCT: 68 %
Neutro Abs: 5.7 10*3/uL (ref 1.4–6.5)
Platelets: 140 10*3/uL — ABNORMAL LOW (ref 150–440)
RBC: 5.95 MIL/uL — ABNORMAL HIGH (ref 4.40–5.90)
RDW: 15.8 % — AB (ref 11.5–14.5)
WBC: 8.2 10*3/uL (ref 3.8–10.6)

## 2015-04-29 DIAGNOSIS — R195 Other fecal abnormalities: Secondary | ICD-10-CM | POA: Insufficient documentation

## 2015-04-29 DIAGNOSIS — M7120 Synovial cyst of popliteal space [Baker], unspecified knee: Secondary | ICD-10-CM | POA: Insufficient documentation

## 2015-04-29 DIAGNOSIS — G4730 Sleep apnea, unspecified: Secondary | ICD-10-CM | POA: Insufficient documentation

## 2015-05-07 ENCOUNTER — Ambulatory Visit: Payer: BLUE CROSS/BLUE SHIELD | Admitting: Internal Medicine

## 2015-05-07 ENCOUNTER — Other Ambulatory Visit: Payer: BLUE CROSS/BLUE SHIELD

## 2015-05-14 ENCOUNTER — Inpatient Hospital Stay: Payer: BLUE CROSS/BLUE SHIELD

## 2015-05-14 ENCOUNTER — Ambulatory Visit: Payer: BLUE CROSS/BLUE SHIELD

## 2015-05-14 ENCOUNTER — Inpatient Hospital Stay: Payer: BLUE CROSS/BLUE SHIELD | Attending: Family Medicine | Admitting: Family Medicine

## 2015-05-14 VITALS — BP 190/124 | HR 51 | Temp 98.0°F | Wt 270.9 lb

## 2015-05-14 VITALS — BP 177/95 | HR 51

## 2015-05-14 DIAGNOSIS — I10 Essential (primary) hypertension: Secondary | ICD-10-CM

## 2015-05-14 DIAGNOSIS — Z87891 Personal history of nicotine dependence: Secondary | ICD-10-CM

## 2015-05-14 DIAGNOSIS — Z79899 Other long term (current) drug therapy: Secondary | ICD-10-CM | POA: Diagnosis not present

## 2015-05-14 DIAGNOSIS — D751 Secondary polycythemia: Secondary | ICD-10-CM

## 2015-05-14 DIAGNOSIS — Z7982 Long term (current) use of aspirin: Secondary | ICD-10-CM | POA: Diagnosis not present

## 2015-05-14 DIAGNOSIS — E780 Pure hypercholesterolemia: Secondary | ICD-10-CM | POA: Diagnosis not present

## 2015-05-14 DIAGNOSIS — G4730 Sleep apnea, unspecified: Secondary | ICD-10-CM | POA: Diagnosis not present

## 2015-05-14 DIAGNOSIS — Z8739 Personal history of other diseases of the musculoskeletal system and connective tissue: Secondary | ICD-10-CM | POA: Diagnosis not present

## 2015-05-14 LAB — CBC WITH DIFFERENTIAL/PLATELET
Basophils Absolute: 0.1 10*3/uL (ref 0–0.1)
Basophils Relative: 1 %
EOS PCT: 2 %
Eosinophils Absolute: 0.2 10*3/uL (ref 0–0.7)
HEMATOCRIT: 49.7 % (ref 40.0–52.0)
Hemoglobin: 15.7 g/dL (ref 13.0–18.0)
LYMPHS PCT: 20 %
Lymphs Abs: 1.7 10*3/uL (ref 1.0–3.6)
MCH: 26.1 pg (ref 26.0–34.0)
MCHC: 31.7 g/dL — ABNORMAL LOW (ref 32.0–36.0)
MCV: 82.3 fL (ref 80.0–100.0)
MONO ABS: 0.8 10*3/uL (ref 0.2–1.0)
Monocytes Relative: 10 %
Neutro Abs: 5.8 10*3/uL (ref 1.4–6.5)
Neutrophils Relative %: 67 %
Platelets: 149 10*3/uL — ABNORMAL LOW (ref 150–440)
RBC: 6.04 MIL/uL — AB (ref 4.40–5.90)
RDW: 15.6 % — ABNORMAL HIGH (ref 11.5–14.5)
WBC: 8.6 10*3/uL (ref 3.8–10.6)

## 2015-05-14 MED ORDER — METOPROLOL SUCCINATE ER 50 MG PO TB24
50.0000 mg | ORAL_TABLET | Freq: Once | ORAL | Status: DC
  Filled 2015-05-14: qty 1

## 2015-05-14 NOTE — Progress Notes (Signed)
Patient's BP elevated today 190/124. Patient forgot to take his BP meds this morning.

## 2015-05-14 NOTE — Progress Notes (Signed)
[facility]  Telephone:(336) [phone]  Fax:(336) [phone]     Jason Bolton DOB: May 07, 1956  MR#: [id number]  KGU#:[id number]  Patient Care Team: Margarita Rana, MD as PCP - General (Family Medicine)  CHIEF COMPLAINT:  Chief Complaint  Patient presents with  . Follow-up   Secondary polycythemia  INTERVAL HISTORY:  Patient is here for continued follow-up in regards to polycythemia. Current target hematocrit as set by Dr. Inez Pilgrim is 47.8 he overall feels well and denies any complaints today. He does state that he ran out of his blood pressure medications 1 day ago and has not had any medication and 24 hours.  REVIEW OF SYSTEMS:   Review of Systems  Constitutional: Negative for fever, chills, weight loss, malaise/fatigue and diaphoresis.  HENT: Negative for congestion, ear discharge, ear pain, hearing loss, nosebleeds, sore throat and tinnitus.   Eyes: Negative for blurred vision, double vision, photophobia, pain, discharge and redness.  Respiratory: Negative for cough, hemoptysis, sputum production, shortness of breath, wheezing and stridor.   Cardiovascular: Negative for chest pain, palpitations, orthopnea, claudication, leg swelling and PND.  Gastrointestinal: Negative for heartburn, nausea, vomiting, abdominal pain, diarrhea, constipation, blood in stool and melena.  Genitourinary: Negative.   Musculoskeletal: Negative.   Skin: Negative.   Neurological: Negative for dizziness, tingling, focal weakness, seizures, weakness and headaches.  Endo/Heme/Allergies: Does not bruise/bleed easily.  Psychiatric/Behavioral: Negative for depression. The patient is not nervous/anxious and does not have insomnia.     As per HPI. Otherwise, a complete review of systems is negatve.  ONCOLOGY HISTORY:  No history exists.    PAST MEDICAL HISTORY: Past Medical History  Diagnosis Date  . Polycythemia   . Hypertension   . Sleep apnea   . Hypercholesterolemia   . 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 Allergy     PAST SURGICAL HISTORY: Past Surgical History  Procedure Laterality Date  . Mandible surgery  2005  . Colonoscopy  2006  . Cataract extraction Right 11/2013  . Inguinal hernia repair Right 1972    FAMILY HISTORY Family History  Problem Relation Age of Onset  . Diabetes Mother   . Congestive Heart Failure Mother     GYNECOLOGIC HISTORY:  No LMP for male patient.     ADVANCED DIRECTIVES:    HEALTH MAINTENANCE: History  Substance Use Topics  . Smoking status: Former Smoker -- 1.50 packs/day for 20 years    Types: Cigarettes  . Smokeless tobacco: Not on file     Comment: quit June of 2008 after Chantix. he was exposed to secondary smoke  . Alcohol Use: 0.0 oz/week    0 Standard drinks or equivalent per week     Comment: occasional     Colonoscopy:  PAP:  Bone density:  Lipid panel:  Allergies  Allergen Reactions  . Penicillins Other (See Comments)    unknown    Current Outpatient Prescriptions  Medication Sig Dispense Refill  . aspirin 81 MG tablet Take 81 mg by mouth daily.    . Coenzyme Q10 50 MG CAPS CO ENZYME Q-10, 50MG  (Oral Capsule)  1 po qd for 0 days  Quantity: 30.00;  Refills: 0   Ordered :31-Aug-2010  Margarita Rana MD;  Started 31-Mar-2009 Active Comments: DX: 272.0    . lisinopril (PRINIVIL,ZESTRIL) 5 MG tablet Take 5 mg by mouth daily.    Marland Kitchen loratadine (CLARITIN) 10 MG tablet LORATADINE, 10MG  (Oral Tablet)  1 po qd for  0 days  Quantity: 30.00;  Refills: 5   Ordered :31-Aug-2010  Margarita Rana MD;  Started 17-Feb-2009 Active Comments: DX: 477.0    . metoprolol succinate (TOPROL-XL) 50 MG 24 hr tablet Take 50 mg by mouth daily. Take with or immediately following a meal.    . naproxen (NAPROSYN) 500 MG tablet   0  . OMEGA-3 FATTY ACIDS PO FISH-EPA, 1000MG  (Oral Capsule)  2 po bid for 0 days  Quantity: 0.00;  Refills: 0   Ordered :31-Aug-2010  Margarita Rana MD;  Started  19-June-2007 Active Comments: DX: 272.0    . sildenafil (VIAGRA) 50 MG tablet Take by mouth.    . simvastatin (ZOCOR) 20 MG tablet Take 20 mg by mouth daily.    . tamsulosin (FLOMAX) 0.4 MG CAPS capsule Take 0.4 mg by mouth daily.    . varenicline (CHANTIX) 1 MG tablet Take by mouth.    . meloxicam (MOBIC) 15 MG tablet   0   No current facility-administered medications for this visit.    OBJECTIVE: BP 190/124 mmHg  Pulse 51  Temp(Src) 98 F (36.7 C)  Wt 270 lb 15.1 oz (122.9 kg)   Body mass index is 36.22 kg/(m^2).    ECOG FS:0 - Asymptomatic  General: Well-developed, well-nourished, no acute distress. Eyes: Pink conjunctiva, anicteric sclera. HEENT: Normocephalic, moist mucous membranes, clear oropharnyx. Lungs: Clear to auscultation bilaterally. Heart: Regular rate and rhythm. No rubs, murmurs, or gallops. Abdomen: Soft, nontender, nondistended. No organomegaly noted, normoactive bowel sounds. Musculoskeletal: No edema, cyanosis, or clubbing. Neuro: Alert, answering all questions appropriately. Cranial nerves grossly intact. Skin: No rashes or petechiae noted. Psych: Normal affect.    LAB RESULTS:     Component Value Date/Time   NA 140 01/15/2015   NA 138 08/18/2012 1343   K 4.8 01/15/2015   K 4.3 08/18/2012 1343   CL 101 08/18/2012 1343   CO2 30 08/18/2012 1343   GLUCOSE 101* 08/18/2012 1343   BUN 15 01/15/2015   BUN 11 08/18/2012 1343   CREATININE 1.0 01/15/2015   CREATININE 0.83 08/18/2012 1343   CALCIUM 9.4 08/18/2012 1343   PROT 7.9 08/18/2012 1343   ALBUMIN 3.9 08/18/2012 1343   AST 20 01/15/2015   AST 14* 08/18/2012 1343   ALT 20 01/15/2015   ALT 19 08/18/2012 1343   ALKPHOS 69 08/18/2012 1343   GFRNONAA >60 08/18/2012 1343   GFRAA >60 08/18/2012 1343    No results found for: SPEP, UPEP  Lab Results  Component Value Date   WBC 8.6 05/14/2015   NEUTROABS 5.8 05/14/2015   HGB 15.7 05/14/2015   HCT 49.7 05/14/2015   MCV 82.3 05/14/2015   PLT  149* 05/14/2015      Chemistry      Component Value Date/Time   NA 140 01/15/2015   NA 138 08/18/2012 1343   K 4.8 01/15/2015   K 4.3 08/18/2012 1343   CL 101 08/18/2012 1343   CO2 30 08/18/2012 1343   BUN 15 01/15/2015   BUN 11 08/18/2012 1343   CREATININE 1.0 01/15/2015   CREATININE 0.83 08/18/2012 1343   GLU 98 01/15/2015      Component Value Date/Time   CALCIUM 9.4 08/18/2012 1343   ALKPHOS 69 08/18/2012 1343   AST 20 01/15/2015   AST 14* 08/18/2012 1343   ALT 20 01/15/2015   ALT 19 08/18/2012 1343       No results found for: LABCA2  No components found for: [id number]  No results for  input(s): INR in the last 168 hours.  No results found for: COLORURINE, APPEARANCEUR, LABSPEC, PHURINE, GLUCOSEU, HGBUR, BILIRUBINUR, KETONESUR, PROTEINUR, UROBILINOGEN, NITRITE, LEUKOCYTESUR  STUDIES: No results found.  ASSESSMENT:  Secondary polycythemia Tobacco abuse  PLAN:   1. Polycythemia. Patient has a previously established target hematocrit of 47.8 per Dr. Inez Pilgrim. Patient will require phlebotomy of 400 in males today. Patient has been instructed to call if he has any persistent headaches or any other issues. 2. Hypertension. Patient has not been on daily blood pressure medications for at least 24 hours, today blood pressure 190/124. Metoprolol was given 50 mg while in infusion. Blood pressure improved following administration.  Patient will continue with monthly lab work and possible phlebotomies, we will see provider again in 6 months.  Patient expressed understanding and was in agreement with this plan. He also understands that He can call clinic at any time with any questions, concerns, or complaints.    Dr. Grayland Ormond was available for consultation and review of plan of care for this patient.  Evlyn Kanner, NP   05/14/2015 2:33 PM

## 2015-06-13 ENCOUNTER — Inpatient Hospital Stay: Payer: BLUE CROSS/BLUE SHIELD

## 2015-06-13 ENCOUNTER — Inpatient Hospital Stay: Payer: BLUE CROSS/BLUE SHIELD | Attending: Family Medicine

## 2015-06-13 DIAGNOSIS — D751 Secondary polycythemia: Secondary | ICD-10-CM

## 2015-06-13 LAB — HEMATOCRIT: HEMATOCRIT: 51.2 % (ref 40.0–52.0)

## 2015-06-16 ENCOUNTER — Other Ambulatory Visit: Payer: Self-pay | Admitting: Family Medicine

## 2015-07-14 ENCOUNTER — Inpatient Hospital Stay: Payer: BLUE CROSS/BLUE SHIELD | Attending: Family Medicine

## 2015-07-14 ENCOUNTER — Inpatient Hospital Stay: Payer: BLUE CROSS/BLUE SHIELD

## 2015-07-14 VITALS — BP 162/103 | HR 58 | Temp 97.5°F | Resp 18

## 2015-07-14 DIAGNOSIS — D751 Secondary polycythemia: Secondary | ICD-10-CM | POA: Insufficient documentation

## 2015-07-14 LAB — HEMATOCRIT: HEMATOCRIT: 49.8 % (ref 40.0–52.0)

## 2015-07-15 ENCOUNTER — Other Ambulatory Visit: Payer: Self-pay | Admitting: Family Medicine

## 2015-07-25 ENCOUNTER — Ambulatory Visit: Payer: Self-pay | Admitting: Family Medicine

## 2015-08-14 ENCOUNTER — Inpatient Hospital Stay: Payer: BLUE CROSS/BLUE SHIELD

## 2015-08-14 ENCOUNTER — Inpatient Hospital Stay: Payer: BLUE CROSS/BLUE SHIELD | Attending: Family Medicine

## 2015-09-05 ENCOUNTER — Inpatient Hospital Stay: Payer: BLUE CROSS/BLUE SHIELD | Attending: Family Medicine

## 2015-09-05 ENCOUNTER — Inpatient Hospital Stay: Payer: BLUE CROSS/BLUE SHIELD

## 2015-09-05 VITALS — BP 169/90 | HR 62 | Resp 18

## 2015-09-05 DIAGNOSIS — D751 Secondary polycythemia: Secondary | ICD-10-CM

## 2015-09-05 LAB — HEMATOCRIT: HCT: 48.2 % (ref 40.0–52.0)

## 2015-09-12 ENCOUNTER — Other Ambulatory Visit: Payer: BLUE CROSS/BLUE SHIELD

## 2015-10-14 ENCOUNTER — Inpatient Hospital Stay: Payer: BLUE CROSS/BLUE SHIELD

## 2015-10-14 ENCOUNTER — Inpatient Hospital Stay: Payer: BLUE CROSS/BLUE SHIELD | Attending: Family Medicine

## 2015-10-14 VITALS — BP 174/100 | HR 70 | Resp 20

## 2015-10-14 DIAGNOSIS — D751 Secondary polycythemia: Secondary | ICD-10-CM | POA: Insufficient documentation

## 2015-10-14 LAB — HEMATOCRIT: HCT: 49.9 % (ref 40.0–52.0)

## 2015-11-13 ENCOUNTER — Inpatient Hospital Stay: Payer: BLUE CROSS/BLUE SHIELD

## 2015-11-13 ENCOUNTER — Inpatient Hospital Stay: Payer: BLUE CROSS/BLUE SHIELD | Attending: Internal Medicine

## 2015-11-13 ENCOUNTER — Ambulatory Visit: Payer: BLUE CROSS/BLUE SHIELD

## 2015-12-02 ENCOUNTER — Encounter: Payer: Self-pay | Admitting: Internal Medicine

## 2015-12-02 ENCOUNTER — Other Ambulatory Visit: Payer: Self-pay | Admitting: Family Medicine

## 2015-12-02 ENCOUNTER — Inpatient Hospital Stay (HOSPITAL_BASED_OUTPATIENT_CLINIC_OR_DEPARTMENT_OTHER): Payer: BLUE CROSS/BLUE SHIELD | Admitting: Internal Medicine

## 2015-12-02 ENCOUNTER — Inpatient Hospital Stay: Payer: Self-pay | Attending: Internal Medicine

## 2015-12-02 ENCOUNTER — Inpatient Hospital Stay: Payer: BLUE CROSS/BLUE SHIELD

## 2015-12-02 VITALS — BP 183/107 | HR 55 | Temp 97.0°F | Resp 22 | Ht 72.5 in | Wt 284.4 lb

## 2015-12-02 DIAGNOSIS — Z79899 Other long term (current) drug therapy: Secondary | ICD-10-CM | POA: Diagnosis not present

## 2015-12-02 DIAGNOSIS — D751 Secondary polycythemia: Secondary | ICD-10-CM

## 2015-12-02 DIAGNOSIS — E7800 Pure hypercholesterolemia, unspecified: Secondary | ICD-10-CM

## 2015-12-02 DIAGNOSIS — Z8739 Personal history of other diseases of the musculoskeletal system and connective tissue: Secondary | ICD-10-CM

## 2015-12-02 DIAGNOSIS — Z7982 Long term (current) use of aspirin: Secondary | ICD-10-CM | POA: Insufficient documentation

## 2015-12-02 DIAGNOSIS — N400 Enlarged prostate without lower urinary tract symptoms: Secondary | ICD-10-CM

## 2015-12-02 DIAGNOSIS — Z87891 Personal history of nicotine dependence: Secondary | ICD-10-CM

## 2015-12-02 DIAGNOSIS — I10 Essential (primary) hypertension: Secondary | ICD-10-CM | POA: Insufficient documentation

## 2015-12-02 DIAGNOSIS — G4730 Sleep apnea, unspecified: Secondary | ICD-10-CM

## 2015-12-02 DIAGNOSIS — E669 Obesity, unspecified: Secondary | ICD-10-CM | POA: Insufficient documentation

## 2015-12-02 LAB — HEMATOCRIT: HCT: 47.6 % (ref 40.0–52.0)

## 2015-12-02 NOTE — Progress Notes (Signed)
[facility]  Telephone:(336) [phone]  Fax:(336) [phone]     [name] DOB: Apr 04, 1956  MR#: [id number]  [id number]  Patient Care Team: Margarita Rana, MD as PCP - General (Family Medicine)  CHIEF COMPLAINT:  No chief complaint on file.  Secondary polycythemia  History of present illness:  Mr. Jason Bolton is a 60 year old gentleman with secondary polycythemia due to morbid obesity and obstructive sleep apnea. He was previously seen on multiple occasions by Dr. Inez Pilgrim, who reliably ruled out polycythemia vera, with negative [id number] and exon 12 mutation, and normal erythropoietin level. Patient received multiple phlebotomies.   Current status: Patient returns for follow-up visit. He denies any significant health-related events, or complaints. He specifically denies headaches, blurred vision, chest pain, shortness of breath. He is compliant with CPAP, which he is able to tolerate well, at which provides him with a good night sleep. He has had an episode of upper respiratory infection, which has lingered for few weeks.  REVIEW OF SYSTEMS:   Review of Systems  Constitutional: Negative for fever, chills, weight loss, malaise/fatigue and diaphoresis.  HENT: Negative for congestion, ear discharge, ear pain, hearing loss, nosebleeds, sore throat and tinnitus.   Eyes: Negative for blurred vision, double vision, photophobia, pain, discharge and redness.  Respiratory: Negative for cough, hemoptysis, sputum production, shortness of breath, wheezing and stridor.   Cardiovascular: Negative for chest pain, palpitations, orthopnea, claudication, leg swelling and PND.  Gastrointestinal: Negative for heartburn, nausea, vomiting, abdominal pain, diarrhea, constipation, blood in stool and melena.  Genitourinary: Negative.   Musculoskeletal: Negative.   Skin: Negative.   Neurological: Negative for dizziness, tingling, focal weakness, seizures, weakness and headaches.    Endo/Heme/Allergies: Does not bruise/bleed easily.  Psychiatric/Behavioral: Negative for depression. The patient is not nervous/anxious and does not have insomnia.     As per HPI. Otherwise, a complete review of systems is negatve.  ONCOLOGY HISTORY:  No history exists.    PAST MEDICAL HISTORY: Past Medical History  Diagnosis Date  . Polycythemia   . Hypertension   . Sleep apnea   . Hypercholesterolemia   . Personal history of osteomyelitis     of the jaw- 2005-mandibular surgery  . Allergy     PAST SURGICAL HISTORY: Past Surgical History  Procedure Laterality Date  . Mandible surgery  2005  . Colonoscopy  2006  . Cataract extraction Right 11/2013  . Inguinal hernia repair Right 1972    FAMILY HISTORY Family History  Problem Relation Age of Onset  . Diabetes Mother   . Congestive Heart Failure Mother     GYNECOLOGIC HISTORY:  No LMP for male patient.     ADVANCED DIRECTIVES:    HEALTH MAINTENANCE: Social History  Substance Use Topics  . Smoking status: Former Smoker -- 1.50 packs/day for 20 years    Types: Cigarettes  . Smokeless tobacco: Not on file     Comment: quit June of 2008 after Chantix. he was exposed to secondary smoke  . Alcohol Use: 0.0 oz/week    0 Standard drinks or equivalent per week     Comment: occasional     Colonoscopy:  PAP:  Bone density:  Lipid panel:  Allergies  Allergen Reactions  . Penicillins Other (See Comments)    unknown    Current Outpatient Prescriptions  Medication Sig Dispense Refill  . aspirin 81 MG tablet Take 81 mg by mouth daily.    . Coenzyme Q10 50 MG CAPS CO ENZYME Q-10, 50MG  (Oral Capsule)  1 po  qd for 0 days  Quantity: 30.00;  Refills: 0   Ordered :31-Aug-2010  Margarita Rana MD;  Started 31-Mar-2009 Active Comments: DX: 272.0    . lisinopril (PRINIVIL,ZESTRIL) 5 MG tablet Take 5 mg by mouth daily.    Marland Kitchen loratadine (CLARITIN) 10 MG tablet LORATADINE, 10MG  (Oral Tablet)  1 po qd for 0 days  Quantity:  30.00;  Refills: 5   Ordered :31-Aug-2010  Margarita Rana MD;  Started 17-Feb-2009 Active Comments: DX: 477.0    . meloxicam (MOBIC) 15 MG tablet   0  . metoprolol succinate (TOPROL-XL) 50 MG 24 hr tablet Take 50 mg by mouth daily. Take with or immediately following a meal.    . naproxen (NAPROSYN) 500 MG tablet   0  . OMEGA-3 FATTY ACIDS PO FISH-EPA, 1000MG  (Oral Capsule)  2 po bid for 0 days  Quantity: 0.00;  Refills: 0   Ordered :31-Aug-2010  Margarita Rana MD;  Started 19-June-2007 Active Comments: DX: 272.0    . sildenafil (VIAGRA) 50 MG tablet Take by mouth.    . simvastatin (ZOCOR) 20 MG tablet Take 20 mg by mouth daily.    . tamsulosin (FLOMAX) 0.4 MG CAPS capsule Take 0.4 mg by mouth daily.    . varenicline (CHANTIX) 1 MG tablet Take by mouth.     No current facility-administered medications for this visit.    OBJECTIVE: BP 183/107 mmHg  Pulse 55  Temp(Src) 97 F (36.1 C) (Tympanic)  Resp 22  Ht 6' 0.5" (1.842 m)  Wt 284 lb 6.3 oz (129 kg)  BMI 38.02 kg/m2   Body mass index is 38.02 kg/(m^2).    ECOG FS:0 - Asymptomatic  General: Well-developed, morbidly obese, no acute distress. Eyes: Pink conjunctiva, anicteric sclera. HEENT: Normocephalic, moist mucous membranes, clear oropharnyx. Lungs: Clear to auscultation bilaterally. Heart: Regular rate and rhythm. No rubs, murmurs, or gallops. Abdomen: Soft, nontender, nondistended. No organomegaly noted, normoactive bowel sounds. Musculoskeletal: No edema, cyanosis, or clubbing. Neuro: Alert, answering all questions appropriately. Cranial nerves grossly intact. Skin: No rashes or petechiae noted. Psych: Normal affect.    LAB RESULTS:     Component Value Date/Time   NA 140 01/15/2015   NA 138 08/18/2012 1343   K 4.8 01/15/2015   K 4.3 08/18/2012 1343   CL 101 08/18/2012 1343   CO2 30 08/18/2012 1343   GLUCOSE 101* 08/18/2012 1343   BUN 15 01/15/2015   BUN 11 08/18/2012 1343   CREATININE 1.0 01/15/2015   CREATININE  0.83 08/18/2012 1343   CALCIUM 9.4 08/18/2012 1343   PROT 7.9 08/18/2012 1343   ALBUMIN 3.9 08/18/2012 1343   AST 20 01/15/2015   AST 14* 08/18/2012 1343   ALT 20 01/15/2015   ALT 19 08/18/2012 1343   ALKPHOS 69 08/18/2012 1343   BILITOT 0.5 08/18/2012 1343   GFRNONAA >60 08/18/2012 1343   GFRAA >60 08/18/2012 1343    No results found for: SPEP, UPEP  Lab Results  Component Value Date   WBC 8.6 05/14/2015   NEUTROABS 5.8 05/14/2015   HGB 15.7 05/14/2015   HCT 49.9 10/14/2015   MCV 82.3 05/14/2015   PLT 149* 05/14/2015      Chemistry      Component Value Date/Time   NA 140 01/15/2015   NA 138 08/18/2012 1343   K 4.8 01/15/2015   K 4.3 08/18/2012 1343   CL 101 08/18/2012 1343   CO2 30 08/18/2012 1343   BUN 15 01/15/2015   BUN 11 08/18/2012 1343  CREATININE 1.0 01/15/2015   CREATININE 0.83 08/18/2012 1343   GLU 98 01/15/2015      Component Value Date/Time   CALCIUM 9.4 08/18/2012 1343   ALKPHOS 69 08/18/2012 1343   AST 20 01/15/2015   AST 14* 08/18/2012 1343   ALT 20 01/15/2015   ALT 19 08/18/2012 1343   BILITOT 0.5 08/18/2012 1343       No results found for: LABCA2  No components found for: LABCA125  No results for input(s): INR in the last 168 hours.  No results found for: COLORURINE, APPEARANCEUR, LABSPEC, PHURINE, GLUCOSEU, HGBUR, BILIRUBINUR, KETONESUR, PROTEINUR, UROBILINOGEN, NITRITE, LEUKOCYTESUR  STUDIES: No results found.  ASSESSMENT:  Secondary erythrocytosis Tobacco abuse  PLAN:   1. Secondary erythrocytosis-had an extensive discussion with the patient, explaining to him that in the absence of polycythemia vera there is no established benefit for continues phlebotomies. It also appears that the patient does not derive any perceived subjective benefit from continues phlebotomies, as such, there is no reason to continue such a practice in this case. There is no target hemoglobin or hematocrit level, which would be associated with improved  outcomes in patients with secondary erythrocytosis. Patient should continue to use CPAP regularly. He will contact us if he feels that his headaches recur, and we will consider performing phlebotomies, if his blood pressure is well controlled, and hematocrit is in the 60-65% range. 2. Hypertension. Patient claims that his blood pressure is only elevated during his visits to [facility]. He is currently asymptomatic and reportedly compliant with her blood pressure medications.  Patient will contact us if needed, but no follow-up appointment was scheduled.  Patient expressed understanding and was in agreement with this plan. He also understands that He can call clinic at any time with any questions, concerns, or complaints.      Roxana Hires, MD   12/02/2015 9:52 AM

## 2015-12-02 NOTE — Telephone Encounter (Signed)
Last OV 03/2015  Thanks,   -Laura  

## 2016-03-10 DIAGNOSIS — G4733 Obstructive sleep apnea (adult) (pediatric): Secondary | ICD-10-CM | POA: Diagnosis not present

## 2016-03-15 ENCOUNTER — Encounter: Payer: Self-pay | Admitting: Family Medicine

## 2016-03-15 ENCOUNTER — Ambulatory Visit (INDEPENDENT_AMBULATORY_CARE_PROVIDER_SITE_OTHER): Payer: BLUE CROSS/BLUE SHIELD | Admitting: Family Medicine

## 2016-03-15 VITALS — BP 190/100 | HR 60 | Temp 98.3°F | Resp 20 | Ht 73.0 in | Wt 276.0 lb

## 2016-03-15 DIAGNOSIS — E7800 Pure hypercholesterolemia, unspecified: Secondary | ICD-10-CM | POA: Diagnosis not present

## 2016-03-15 DIAGNOSIS — N528 Other male erectile dysfunction: Secondary | ICD-10-CM

## 2016-03-15 DIAGNOSIS — E669 Obesity, unspecified: Secondary | ICD-10-CM | POA: Diagnosis not present

## 2016-03-15 DIAGNOSIS — F17200 Nicotine dependence, unspecified, uncomplicated: Secondary | ICD-10-CM

## 2016-03-15 DIAGNOSIS — N400 Enlarged prostate without lower urinary tract symptoms: Secondary | ICD-10-CM | POA: Diagnosis not present

## 2016-03-15 DIAGNOSIS — Z6836 Body mass index (BMI) 36.0-36.9, adult: Secondary | ICD-10-CM | POA: Insufficient documentation

## 2016-03-15 DIAGNOSIS — N529 Male erectile dysfunction, unspecified: Secondary | ICD-10-CM

## 2016-03-15 DIAGNOSIS — I10 Essential (primary) hypertension: Secondary | ICD-10-CM

## 2016-03-15 MED ORDER — VARENICLINE TARTRATE 1 MG PO TABS: 1.0000 mg | ORAL_TABLET | Freq: Two times a day (BID) | ORAL | Status: DC

## 2016-03-15 MED ORDER — METOPROLOL TARTRATE 50 MG PO TABS: 50.0000 mg | ORAL_TABLET | Freq: Two times a day (BID) | ORAL | Status: DC

## 2016-03-15 MED ORDER — SILDENAFIL CITRATE 50 MG PO TABS: 50.0000 mg | ORAL_TABLET | ORAL | Status: DC | PRN

## 2016-03-15 MED ORDER — SIMVASTATIN 20 MG PO TABS: 20.0000 mg | ORAL_TABLET | Freq: Every day | ORAL | Status: DC

## 2016-03-15 MED ORDER — LISINOPRIL 10 MG PO TABS: 10.0000 mg | ORAL_TABLET | Freq: Every day | ORAL | Status: DC

## 2016-03-15 MED ORDER — TAMSULOSIN HCL 0.4 MG PO CAPS: 0.4000 mg | ORAL_CAPSULE | Freq: Every day | ORAL | Status: DC

## 2016-03-15 NOTE — Progress Notes (Signed)
Subjective:    Patient ID: Jason Bolton, male    DOB: 1956/06/23, 60 y.o.   MRN: [id number]  Hypertension This is a chronic problem. The problem is uncontrolled (pt ran out of medications about 3 weeks ago). Pertinent negatives include no anxiety, blurred vision, chest pain, headaches, malaise/fatigue, neck pain, orthopnea, palpitations, peripheral edema, shortness of breath or sweats. Risk factors for coronary artery disease include dyslipidemia, obesity, male gender and stress (pt reports stress is manageable. Refuses medication and psych referral for this problem at this time.). Treatments tried: currently taking Lisinopril 10 mg, Metoprolol Tartrate 50 mg. Compliance problems: medications had no refillls.   Hyperlipidemia This is a chronic problem. Lipid results: 01/15/2015 Total- 164, Trig- 87, HDL- 46, LDL- 101. Pertinent negatives include no chest pain or shortness of breath. Current antihyperlipidemic treatment includes statins (Simvastatin 20 mg).   Still not smoking. No longer needs to see Hematology for  Polycythemia.  Has improved. Presumed secondary to not smoking. Is using his CPAP machine.     Review of Systems  Constitutional: Negative for malaise/fatigue.  Eyes: Negative for blurred vision.  Respiratory: Negative for shortness of breath.   Cardiovascular: Negative for chest pain, palpitations and orthopnea.  Musculoskeletal: Negative for neck pain.  Neurological: Negative for headaches.   BP 190/100 mmHg  Pulse 60  Temp(Src) 98.3 F (36.8 C) (Oral)  Resp 20  Ht 6\' 1"  (1.854 m)  Wt 276 lb (125.193 kg)  BMI 36.42 kg/m2   Patient Active Problem List   Diagnosis Date Noted  . Baker cyst 04/29/2015  . Fecal occult blood test positive 04/29/2015  . Apnea, sleep 04/29/2015  . Hay fever 02/17/2009  . Benign prostatic hypertrophy without urinary obstruction 02/17/2009  . ED (erectile dysfunction) of organic origin 04/21/2007  . Compulsive tobacco user syndrome  04/21/2007  . Essential (primary) hypertension 04/19/2007  . Acquired polycythemia 04/19/2007  . Hypercholesterolemia without hypertriglyceridemia 05/19/2006   Past Medical History  Diagnosis Date  . Polycythemia   . Hypertension   . Sleep apnea   . Hypercholesterolemia   . Personal history of osteomyelitis     of the jaw- 2005-mandibular surgery  . Allergy    Current Outpatient Prescriptions on File Prior to Visit  Medication Sig  . aspirin 81 MG tablet Take 81 mg by mouth daily.  . Coenzyme Q10 50 MG CAPS CO ENZYME Q-10, 50MG  (Oral Capsule)  1 po qd for 0 days  Quantity: 30.00;  Refills: 0   Ordered :31-Aug-2010  Margarita Rana MD;  Started 31-Mar-2009 Active Comments: DX: 272.0  . OMEGA-3 FATTY ACIDS PO FISH-EPA, 1000MG  (Oral Capsule)  2 po bid for 0 days  Quantity: 0.00;  Refills: 0   Ordered :31-Aug-2010  Margarita Rana MD;  Started 19-June-2007 Active Comments: DX: 272.0  . lisinopril (PRINIVIL,ZESTRIL) 10 MG tablet TAKE 1 BY MOUTH DAILY (Patient not taking: Reported on 03/15/2016)  . metoprolol (LOPRESSOR) 50 MG tablet TAKE 1 BY MOUTH TWICE DAILY (Patient not taking: Reported on 03/15/2016)  . sildenafil (VIAGRA) 50 MG tablet Take by mouth. Reported on 03/15/2016  . simvastatin (ZOCOR) 20 MG tablet TAKE 1 BY MOUTH DAILY AT BEDTIME (Patient not taking: Reported on 03/15/2016)  . tamsulosin (FLOMAX) 0.4 MG CAPS capsule TAKE 1 BY MOUTH AT BEDTIME (Patient not taking: Reported on 03/15/2016)   No current facility-administered medications on file prior to visit.   Allergies  Allergen Reactions  . Penicillins Other (See Comments)    unknown   Past Surgical  History  Procedure Laterality Date  . Mandible surgery  2005  . Colonoscopy  2006  . Cataract extraction Right 11/2013  . Inguinal hernia repair Right 1972   Social History   Social History  . Marital Status: Married    Spouse Name: N/A  . Number of Children: N/A  . Years of Education: N/A   Occupational History  . Not on  file.   Social History Main Topics  . Smoking status: Former Smoker -- 1.50 packs/day for 20 years    Types: Cigarettes    Quit date: 11/29/2015  . Smokeless tobacco: Never Used     Comment: quit June of 2008 after Chantix. he was exposed to secondary smoke  . Alcohol Use: 0.0 oz/week    0 Standard drinks or equivalent per week     Comment: occasional  . Drug Use: Not on file  . Sexual Activity: Not on file   Other Topics Concern  . Not on file   Social History Narrative   Family History  Problem Relation Age of Onset  . Diabetes Mother   . Congestive Heart Failure Mother        Objective:   Physical Exam  Constitutional: He appears well-developed and well-nourished.  Cardiovascular: Normal rate, regular rhythm and normal heart sounds.   Pulmonary/Chest: Effort normal and breath sounds normal. No respiratory distress.  Psychiatric: He has a normal mood and affect. His behavior is normal.  BP 190/100 mmHg  Pulse 60  Temp(Src) 98.3 F (36.8 C) (Oral)  Resp 20  Ht 6\' 1"  (1.854 m)  Wt 276 lb (125.193 kg)  BMI 36.42 kg/m2     Assessment & Plan:  1. Essential (primary) hypertension Uncontrolled off of medication. E-scribe to mail order pharmacy. 30 day prescription written for pt to take to local pharmacy. Recheck 6 weeks. Will check labs at FU. Continue to use CPAP machine also.   - metoprolol (LOPRESSOR) 50 MG tablet; Take 1 tablet (50 mg total) by mouth 2 (two) times daily.  Dispense: 180 tablet; Refill: 3 - lisinopril (PRINIVIL,ZESTRIL) 10 MG tablet; Take 1 tablet (10 mg total) by mouth daily.  Dispense: 90 tablet; Refill: 3  2. Benign prostatic hypertrophy without urinary obstruction Refill labs as below. Will check PSA at 6 week FU. - tamsulosin (FLOMAX) 0.4 MG CAPS capsule; Take 1 capsule (0.4 mg total) by mouth daily.  Dispense: 90 capsule; Refill: 3  3. ED (erectile dysfunction) of organic origin Refill medications as below. - sildenafil (VIAGRA) 50 MG tablet;  Take 1 tablet (50 mg total) by mouth as needed for erectile dysfunction. Reported on 03/15/2016  Dispense: 8 tablet; Refill: 3  4. Hypercholesterolemia without hypertriglyceridemia Refill medications and check labs at 6 week FU. - simvastatin (ZOCOR) 20 MG tablet; Take 1 tablet (20 mg total) by mouth at bedtime.  Dispense: 90 tablet; Refill: 3  5. BMI 36.0-36.9,adult Is a concern due to HTN. Work on diet and exercise.  6. Obesity Pt working on lifestyle changes. Does meet criteria for morbid obesity but is very unhappy with diagnosis and is going to change it.   Encouraged pt to continue diet and exercise.  7. Compulsive tobacco user syndrome Stable. Pt not smoking at this time. Will refill medications for pt to fill PRN.  Encouraged continued no smoking. Emphasized that this is reason he does not need to see Hematology any more.  - varenicline (CHANTIX) 1 MG tablet; Take 1 tablet (1 mg total) by mouth 2 (two) times  daily.  Dispense: 60 tablet; Refill: 3   Patient seen and examined by Jerrell Belfast, MD, and note scribed by Renaldo Fiddler, CMA.  I have reviewed the document for accuracy and completeness and I agree with above. Jerrell Belfast, MD   Margarita Rana, MD

## 2016-04-28 ENCOUNTER — Encounter: Payer: Self-pay | Admitting: Family Medicine

## 2016-04-28 ENCOUNTER — Ambulatory Visit (INDEPENDENT_AMBULATORY_CARE_PROVIDER_SITE_OTHER): Payer: BLUE CROSS/BLUE SHIELD | Admitting: Family Medicine

## 2016-04-28 VITALS — BP 172/100 | HR 60 | Temp 97.7°F | Resp 20 | Ht 73.0 in | Wt 275.0 lb

## 2016-04-28 DIAGNOSIS — I10 Essential (primary) hypertension: Secondary | ICD-10-CM

## 2016-04-28 DIAGNOSIS — E7800 Pure hypercholesterolemia, unspecified: Secondary | ICD-10-CM

## 2016-04-28 DIAGNOSIS — N400 Enlarged prostate without lower urinary tract symptoms: Secondary | ICD-10-CM | POA: Diagnosis not present

## 2016-04-28 MED ORDER — LISINOPRIL-HYDROCHLOROTHIAZIDE 20-12.5 MG PO TABS: 1.0000 | ORAL_TABLET | Freq: Every day | ORAL | Status: DC

## 2016-04-28 NOTE — Progress Notes (Signed)
Subjective:    Patient ID: Jason Bolton, male    DOB: Aug 17, 1956, 60 y.o.   MRN: [id number]  HPI   Hypertension, follow-up:  BP Readings from Last 3 Encounters:  04/28/16 172/100  03/15/16 190/100  12/02/15 183/107    He was last seen for hypertension 6 weeks ago.  BP at that visit was 190/100. Management since that visit includes restarting Metoprolol and Lisinopril. He reports excellent compliance with treatment. He is not having side effects. He is not exercising due to not having the opportunity secondary to work and father's health. He is adherent to low salt diet.   Outside blood pressures have been elevated. He is experiencing none.  Patient denies chest pain, dyspnea, exertional chest pressure/discomfort, fatigue, irregular heart beat, lower extremity edema, near-syncope, orthopnea, palpitations and syncope.   Cardiovascular risk factors include advanced age (older than 28 for men, 52 for women), dyslipidemia, hypertension, male gender and obesity (BMI >= 30 kg/m2).    Weight trend: stable Wt Readings from Last 3 Encounters:  04/28/16 275 lb (124.739 kg)  03/15/16 276 lb (125.193 kg)  12/02/15 284 lb 6.3 oz (129 kg)    Current diet: in general, a "healthy" diet    ------------------------------------------------------------------------    Lipid/Cholesterol, Follow-up:   Last seen for this 6 weeks ago.  Management changes since that visit include restarting Simvastatin. . Last Lipid Panel:    Component Value Date/Time   CHOL 164 01/15/2015   TRIG 87 01/15/2015   HDL 46 01/15/2015   LDLCALC 101 01/15/2015     He reports excellent compliance with treatment. He is not having side effects.    Wt Readings from Last 3 Encounters:  04/28/16 275 lb (124.739 kg)  03/15/16 276 lb (125.193 kg)  12/02/15 284 lb 6.3 oz (129 kg)    -------------------------------------------------------------------    Review of Systems  Constitutional: Negative  for fever, chills, diaphoresis, activity change, appetite change, fatigue and unexpected weight change.  Respiratory: Negative for cough, shortness of breath and wheezing.   Cardiovascular: Negative for chest pain, palpitations and leg swelling.  Endocrine: Negative for polydipsia.   BP 172/100 mmHg  Pulse 60  Temp(Src) 97.7 F (36.5 C) (Oral)  Resp 20  Ht _0  (1.854 m)  Wt 275 lb (124.739 kg)  BMI 36.29 kg/m2   Patient Active Problem List   Diagnosis Date Noted  . BMI 36.0-36.9,adult 03/15/2016  . Obesity 03/15/2016  . Baker cyst 04/29/2015  . Fecal occult blood test positive 04/29/2015  . Apnea, sleep 04/29/2015  . Hay fever 02/17/2009  . Benign prostatic hypertrophy without urinary obstruction 02/17/2009  . ED (erectile dysfunction) of organic origin 04/21/2007  . Compulsive tobacco user syndrome 04/21/2007  . Essential (primary) hypertension 04/19/2007  . Acquired polycythemia 04/19/2007  . Hypercholesterolemia without hypertriglyceridemia 05/19/2006   Past Medical History  Diagnosis Date  . Polycythemia   . Hypertension   . Sleep apnea   . Hypercholesterolemia   . Personal history of osteomyelitis     of the jaw- 2005-mandibular surgery  . Allergy    Current Outpatient Prescriptions on File Prior to Visit  Medication Sig  . aspirin 81 MG tablet Take 81 mg by mouth daily.  . Coenzyme Q10 50 MG CAPS CO ENZYME Q-10, 50MG (Oral Capsule)  1 po qd for 0 days  Quantity: 30.00;  Refills: 0   Ordered :31-Aug-2010  Margarita Rana MD;  Started 31-Mar-2009 Active Comments: DX: 272.0  . lisinopril (PRINIVIL,ZESTRIL) 10 MG tablet Take  1 tablet (10 mg total) by mouth daily.  . metoprolol (LOPRESSOR) 50 MG tablet Take 1 tablet (50 mg total) by mouth 2 (two) times daily.  . OMEGA-3 FATTY ACIDS PO FISH-EPA, 1000MG (Oral Capsule)  2 po bid for 0 days  Quantity: 0.00;  Refills: 0   Ordered :31-Aug-2010  Margarita Rana MD;  Started 19-June-2007 Active Comments: DX: 272.0  . sildenafil  (VIAGRA) 50 MG tablet Take 1 tablet (50 mg total) by mouth as needed for erectile dysfunction. Reported on 03/15/2016  . simvastatin (ZOCOR) 20 MG tablet Take 1 tablet (20 mg total) by mouth at bedtime.  . tamsulosin (FLOMAX) 0.4 MG CAPS capsule Take 1 capsule (0.4 mg total) by mouth daily.  . varenicline (CHANTIX) 1 MG tablet Take 1 tablet (1 mg total) by mouth 2 (two) times daily.   No current facility-administered medications on file prior to visit.   Allergies  Allergen Reactions  . Penicillins Other (See Comments)    unknown   Past Surgical History  Procedure Laterality Date  . Mandible surgery  2005  . Colonoscopy  2006  . Cataract extraction Right 11/2013  . Inguinal hernia repair Right 1972   Social History   Social History  . Marital Status: Married    Spouse Name: N/A  . Number of Children: N/A  . Years of Education: N/A   Occupational History  . Not on file.   Social History Main Topics  . Smoking status: Former Smoker -- 1.50 packs/day for 20 years    Types: Cigarettes    Quit date: 11/29/2015  . Smokeless tobacco: Never Used     Comment: quit June of 2008 after Chantix. he was exposed to secondary smoke  . Alcohol Use: 0.0 oz/week    0 Standard drinks or equivalent per week     Comment: occasional  . Drug Use: Not on file  . Sexual Activity: Not on file   Other Topics Concern  . Not on file   Social History Narrative   Family History  Problem Relation Age of Onset  . Diabetes Mother   . Congestive Heart Failure Mother         Objective:   Physical Exam  Constitutional: He appears well-developed and well-nourished.  Cardiovascular: Normal rate, regular rhythm and normal heart sounds.   Pulmonary/Chest: Effort normal and breath sounds normal. No respiratory distress.  Psychiatric: He has a normal mood and affect. His behavior is normal.   BP 172/100 mmHg  Pulse 60  Temp(Src) 97.7 F (36.5 C) (Oral)  Resp 20  Ht _0  (1.854 m)  Wt 275 lb  (124.739 kg)  BMI 36.29 kg/m2     Assessment & Plan:  1. Essential (primary) hypertension Not to goal. Change Lisinopril 10 mg to Lisinopril-HCTZ 20-12.5 mg po qd as below. Will FU with Dr. Caryn Section in 6 weeks. Will need to recheck Met. C at that time. - lisinopril-hydrochlorothiazide (ZESTORETIC) 20-12.5 MG tablet; Take 1 tablet by mouth daily.  Dispense: 90 tablet; Refill: 3 - CBC with Differential/Platelet - Comprehensive metabolic panel  2. Hypercholesterolemia without hypertriglyceridemia Compliant with medications. Will check labs as below. FU pending results. - Lipid panel - TSH  3. Benign prostatic hypertrophy without urinary obstruction Check PSA as below. - PSA   Patient seen and examined by Jerrell Belfast, MD, and note scribed by Renaldo Fiddler, CMA.   I have reviewed the document for accuracy and completeness and I agree with above. Jerrell Belfast, MD  Margarita Rana, MD

## 2016-04-29 LAB — CBC WITH DIFFERENTIAL/PLATELET
BASOS: 1 %
Basophils Absolute: 0.1 10*3/uL (ref 0.0–0.2)
EOS (ABSOLUTE): 0.2 10*3/uL (ref 0.0–0.4)
EOS: 2 %
HEMATOCRIT: 54.3 % — AB (ref 37.5–51.0)
HEMOGLOBIN: 18.1 g/dL — AB (ref 12.6–17.7)
IMMATURE GRANS (ABS): 0.1 10*3/uL (ref 0.0–0.1)
IMMATURE GRANULOCYTES: 1 %
LYMPHS: 20 %
Lymphocytes Absolute: 1.7 10*3/uL (ref 0.7–3.1)
MCH: 27.8 pg (ref 26.6–33.0)
MCHC: 33.3 g/dL (ref 31.5–35.7)
MCV: 83 fL (ref 79–97)
MONOCYTES: 8 %
Monocytes Absolute: 0.7 10*3/uL (ref 0.1–0.9)
NEUTROS ABS: 5.9 10*3/uL (ref 1.4–7.0)
NEUTROS PCT: 68 %
PLATELETS: 138 10*3/uL — AB (ref 150–379)
RBC: 6.52 x10E6/uL — ABNORMAL HIGH (ref 4.14–5.80)
RDW: 17.4 % — ABNORMAL HIGH (ref 12.3–15.4)
WBC: 8.6 10*3/uL (ref 3.4–10.8)

## 2016-04-29 LAB — COMPREHENSIVE METABOLIC PANEL
A/G RATIO: 1.7 (ref 1.2–2.2)
ALBUMIN: 4.5 g/dL (ref 3.6–4.8)
ALT: 20 IU/L (ref 0–44)
AST: 18 IU/L (ref 0–40)
Alkaline Phosphatase: 59 IU/L (ref 39–117)
BUN/Creatinine Ratio: 16 (ref 10–24)
BUN: 14 mg/dL (ref 8–27)
Bilirubin Total: 0.5 mg/dL (ref 0.0–1.2)
CALCIUM: 9.8 mg/dL (ref 8.6–10.2)
CO2: 26 mmol/L (ref 18–29)
Chloride: 96 mmol/L (ref 96–106)
Creatinine, Ser: 0.88 mg/dL (ref 0.76–1.27)
GFR calc Af Amer: 106 mL/min/{1.73_m2} (ref >59)
GFR, EST NON AFRICAN AMERICAN: 92 mL/min/{1.73_m2} (ref >59)
GLOBULIN, TOTAL: 2.6 g/dL (ref 1.5–4.5)
Glucose: 96 mg/dL (ref 65–99)
POTASSIUM: 5.1 mmol/L (ref 3.5–5.2)
Sodium: 141 mmol/L (ref 134–144)
TOTAL PROTEIN: 7.1 g/dL (ref 6.0–8.5)

## 2016-04-29 LAB — LIPID PANEL
CHOL/HDL RATIO: 3.2 ratio (ref 0.0–5.0)
CHOLESTEROL TOTAL: 131 mg/dL (ref 100–199)
HDL: 41 mg/dL (ref >39)
LDL Calculated: 72 mg/dL (ref 0–99)
TRIGLYCERIDES: 89 mg/dL (ref 0–149)
VLDL Cholesterol Cal: 18 mg/dL (ref 5–40)

## 2016-04-29 LAB — PSA: Prostate Specific Ag, Serum: 1.2 ng/mL (ref 0.0–4.0)

## 2016-04-29 LAB — TSH: TSH: 2 u[IU]/mL (ref 0.450–4.500)

## 2016-05-03 ENCOUNTER — Telehealth: Payer: Self-pay | Admitting: *Deleted

## 2016-05-03 NOTE — Telephone Encounter (Signed)
hct drawn on 5/31 at pcp's office is 54.3. Pt would like to be scheduled for a therapeutic phlebotomy this week.  Dr. Rogue Bussing, please advise.   RN spoke with Dr. Marius Ditch to proceed with therapeutic phlebotomy if patient is symptomatic.  Msg sent to [facility]. To proceed with scheduling the phlebotomy.

## 2016-05-03 NOTE — Telephone Encounter (Signed)
-----   Message from [place] sent at 05/03/2016  9:35 AM EDT ----- pts hct was high needs to have a phlebotomy can he just come and get one.

## 2016-05-04 ENCOUNTER — Inpatient Hospital Stay: Payer: BLUE CROSS/BLUE SHIELD | Attending: Family Medicine

## 2016-05-04 VITALS — BP 191/108 | HR 58 | Temp 97.1°F | Resp 20

## 2016-05-04 DIAGNOSIS — D751 Secondary polycythemia: Secondary | ICD-10-CM | POA: Insufficient documentation

## 2016-05-11 ENCOUNTER — Other Ambulatory Visit: Payer: Self-pay | Admitting: Family Medicine

## 2016-06-03 ENCOUNTER — Ambulatory Visit: Payer: BLUE CROSS/BLUE SHIELD | Admitting: Family Medicine

## 2016-09-17 ENCOUNTER — Other Ambulatory Visit: Payer: Self-pay | Admitting: Family Medicine

## 2016-10-01 ENCOUNTER — Encounter: Payer: Self-pay | Admitting: Hematology and Oncology

## 2016-10-01 ENCOUNTER — Inpatient Hospital Stay (HOSPITAL_BASED_OUTPATIENT_CLINIC_OR_DEPARTMENT_OTHER): Payer: BLUE CROSS/BLUE SHIELD | Admitting: Hematology and Oncology

## 2016-10-01 ENCOUNTER — Inpatient Hospital Stay: Payer: BLUE CROSS/BLUE SHIELD

## 2016-10-01 ENCOUNTER — Inpatient Hospital Stay: Payer: BLUE CROSS/BLUE SHIELD | Attending: Hematology and Oncology

## 2016-10-01 VITALS — BP 171/96 | HR 56 | Temp 97.4°F | Resp 18 | Wt 278.9 lb

## 2016-10-01 VITALS — BP 144/89 | HR 55

## 2016-10-01 DIAGNOSIS — E669 Obesity, unspecified: Secondary | ICD-10-CM | POA: Insufficient documentation

## 2016-10-01 DIAGNOSIS — Z79899 Other long term (current) drug therapy: Secondary | ICD-10-CM

## 2016-10-01 DIAGNOSIS — I10 Essential (primary) hypertension: Secondary | ICD-10-CM | POA: Insufficient documentation

## 2016-10-01 DIAGNOSIS — Z7982 Long term (current) use of aspirin: Secondary | ICD-10-CM

## 2016-10-01 DIAGNOSIS — G4730 Sleep apnea, unspecified: Secondary | ICD-10-CM | POA: Insufficient documentation

## 2016-10-01 DIAGNOSIS — D751 Secondary polycythemia: Secondary | ICD-10-CM

## 2016-10-01 DIAGNOSIS — Z87891 Personal history of nicotine dependence: Secondary | ICD-10-CM | POA: Insufficient documentation

## 2016-10-01 DIAGNOSIS — G4739 Other sleep apnea: Secondary | ICD-10-CM

## 2016-10-01 DIAGNOSIS — E7800 Pure hypercholesterolemia, unspecified: Secondary | ICD-10-CM | POA: Insufficient documentation

## 2016-10-01 DIAGNOSIS — R51 Headache: Secondary | ICD-10-CM | POA: Insufficient documentation

## 2016-10-01 DIAGNOSIS — G4762 Sleep related leg cramps: Secondary | ICD-10-CM

## 2016-10-01 DIAGNOSIS — R300 Dysuria: Secondary | ICD-10-CM | POA: Insufficient documentation

## 2016-10-01 DIAGNOSIS — Z23 Encounter for immunization: Secondary | ICD-10-CM

## 2016-10-01 LAB — CBC WITH DIFFERENTIAL/PLATELET
Basophils Absolute: 0.1 10*3/uL (ref 0–0.1)
Basophils Relative: 1 %
EOS ABS: 0.2 10*3/uL (ref 0–0.7)
EOS PCT: 3 %
HCT: 58.9 % — ABNORMAL HIGH (ref 40.0–52.0)
Hemoglobin: 19.2 g/dL — ABNORMAL HIGH (ref 13.0–18.0)
LYMPHS ABS: 2 10*3/uL (ref 1.0–3.6)
LYMPHS PCT: 20 %
MCH: 29.3 pg (ref 26.0–34.0)
MCHC: 32.7 g/dL (ref 32.0–36.0)
MCV: 89.7 fL (ref 80.0–100.0)
MONO ABS: 0.8 10*3/uL (ref 0.2–1.0)
MONOS PCT: 8 %
Neutro Abs: 6.6 10*3/uL — ABNORMAL HIGH (ref 1.4–6.5)
Neutrophils Relative %: 68 %
PLATELETS: 129 10*3/uL — AB (ref 150–440)
RBC: 6.57 MIL/uL — AB (ref 4.40–5.90)
RDW: 15.8 % — AB (ref 11.5–14.5)
WBC: 9.7 10*3/uL (ref 3.8–10.6)

## 2016-10-01 MED ORDER — INFLUENZA VAC SPLIT QUAD 0.5 ML IM SUSY
0.5000 mL | PREFILLED_SYRINGE | Freq: Once | INTRAMUSCULAR | Status: AC
  Administered 2016-10-01: 0.5 mL via INTRAMUSCULAR
  Filled 2016-10-01: qty 0.5

## 2016-10-01 MED ORDER — RIVAROXABAN (XARELTO) VTE STARTER PACK (15 & 20 MG): ORAL_TABLET | ORAL | 0 refills | Status: DC

## 2016-10-01 MED ORDER — OXYCODONE HCL 15 MG PO TABS: 15.0000 mg | ORAL_TABLET | ORAL | 0 refills | Status: DC | PRN

## 2016-10-01 NOTE — Assessment & Plan Note (Signed)
The patient has acquired polycythemia secondary to poorly controlled obstructive sleep apnea I recommend phlebotomy every 4 weeks to keep hematocrit under 48 I will go ahead and have 1 unit of blood removed, another one before Thanksgiving and then every 4 weeks indefinitely The patient is instructed to take aspirin therapy

## 2016-10-01 NOTE — Assessment & Plan Note (Signed)
The most likely cause of the polycythemia is due to sleep apnea I recommend he consult with his pulmonologist for possible titration/adjustment of his C Pap device

## 2016-10-01 NOTE — Progress Notes (Signed)
[facility] progress notes  Patient Care Team: Margarita Rana, MD as PCP - General (Family Medicine)  CHIEF COMPLAINTS/PURPOSE OF VISIT:  Polycythemia  HISTORY OF PRESENTING ILLNESS:  Jason Bolton 60 y.o. male was transferred to my care after his prior physician has left.  I reviewed the patient's records extensive and collaborated the history with the patient. Summary of his history is as follows: Jason Bolton is a gentleman with secondary polycythemia due to morbid obesity and obstructive sleep apnea. He was previously seen on multiple occasions by Dr. Inez Pilgrim, who reliably ruled out polycythemia vera, with negative [id number] and exon 12 mutation, and normal erythropoietin level. Patient received multiple phlebotomies. The patient started to experience headache and leg cramps recently He has no problems with phlebotomy. In fact, phlebotomy helps with his symptoms He uses his C Pap machine on a regular basis but it is an old machine and he has no adjustment for the last 6 years He denies recent chest pain or shortness of breath  MEDICAL HISTORY:  Past Medical History:  Diagnosis Date  . Allergy   . Hypercholesterolemia   . Hypertension   . 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 Polycythemia   . Sleep apnea     SURGICAL HISTORY: Past Surgical History:  Procedure Laterality Date  . CATARACT EXTRACTION Right 11/2013  . COLONOSCOPY  2006  . INGUINAL HERNIA REPAIR Right 1972  . MANDIBLE SURGERY  2005    SOCIAL HISTORY: Social History   Social History  . Marital status: Married    Spouse name: N/A  . Number of children: 2  . Years of education: N/A   Occupational History  . supervisor    Social History Main Topics  . Smoking status: Former Smoker    Packs/day: 1.50    Years: 20.00    Types: Cigarettes    Quit date: 11/29/2015  . Smokeless tobacco: Never Used     Comment: quit June of 2008 after Chantix.  he was exposed to secondary smoke  . Alcohol use 0.0 oz/week     Comment: occasional  . Drug use: Unknown  . Sexual activity: Not on file   Other Topics Concern  . Not on file   Social History Narrative  . No narrative on file    FAMILY HISTORY: Family History  Problem Relation Age of Onset  . Diabetes Mother   . Congestive Heart Failure Mother     ALLERGIES:  is allergic to penicillins.  MEDICATIONS:  Current Outpatient Prescriptions  Medication Sig Dispense Refill  . aspirin 81 MG tablet Take 81 mg by mouth daily.    . Coenzyme Q10 50 MG CAPS CO ENZYME Q-10, 50MG  (Oral Capsule)  1 po qd for 0 days  Quantity: 30.00;  Refills: 0   Ordered :31-Aug-2010  Margarita Rana MD;  Started 31-Mar-2009 Active Comments: DX: 272.0    . lisinopril-hydrochlorothiazide (ZESTORETIC) 20-12.5 MG tablet Take 1 tablet by mouth daily. 90 tablet 3  . metoprolol (LOPRESSOR) 50 MG tablet TAKE 1 TABLET BY MOUTH TWICE A DAY 60 tablet 5  . OMEGA-3 FATTY ACIDS PO FISH-EPA, 1000MG  (Oral Capsule)  2 po bid for 0 days  Quantity: 0.00;  Refills: 0   Ordered :31-Aug-2010  Margarita Rana MD;  Started 19-June-2007 Active Comments: DX: 272.0    . sildenafil (VIAGRA) 50 MG tablet Take 1 tablet (50 mg total) by mouth as needed for erectile dysfunction. Reported on 03/15/2016  8 tablet 3  . simvastatin (ZOCOR) 20 MG tablet TAKE 1 TABLET BY MOUTH AT BEDTIME 30 tablet 5  . tamsulosin (FLOMAX) 0.4 MG CAPS capsule TAKE ONE CAPSULE BY MOUTH AT BEDTIME 30 capsule 5   No current facility-administered medications for this visit.     REVIEW OF SYSTEMS:   Constitutional: Denies fevers, chills or abnormal night sweats Eyes: Denies blurriness of vision, double vision or watery eyes Ears, nose, mouth, throat, and face: Denies mucositis or sore throat Respiratory: Denies cough, dyspnea or wheezes Cardiovascular: Denies palpitation, chest discomfort or lower extremity swelling Gastrointestinal:  Denies nausea, heartburn or change in  bowel habits Skin: Denies abnormal skin rashes Lymphatics: Denies new lymphadenopathy or easy bruising Neurological:Denies numbness, tingling or new weaknesses Behavioral/Psych: Mood is stable, no new changes  All other systems were reviewed with the patient and are negative.  PHYSICAL EXAMINATION: ECOG PERFORMANCE STATUS: 0 - Asymptomatic  Vitals:   10/01/16 1349  BP: (!) 171/96  Pulse: (!) 56  Resp: 18  Temp: 97.4 F (36.3 C)   Filed Weights   10/01/16 1349  Weight: 278 lb 14.1 oz (126.5 kg)    GENERAL:alert, no distress and comfortable. He is moderately obese SKIN: skin color, texture, turgor are normal, no rashes or significant lesions EYES: normal, conjunctiva are pink and non-injected, sclera clear OROPHARYNX:no exudate, normal lips, buccal mucosa, and tongue  NECK: supple, thyroid normal size, non-tender, without nodularity LYMPH:  no palpable lymphadenopathy in the cervical, axillary or inguinal LUNGS: clear to auscultation and percussion with normal breathing effort HEART: regular rate & rhythm and no murmurs without lower extremity edema ABDOMEN:abdomen soft, non-tender and normal bowel sounds Musculoskeletal:no cyanosis of digits and no clubbing  PSYCH: alert & oriented x 3 with fluent speech NEURO: no focal motor/sensory deficits  LABORATORY DATA:  I have reviewed the data as listed Lab Results  Component Value Date   WBC 9.7 10/01/2016   HGB 19.2 (H) 10/01/2016   HCT 58.9 (H) 10/01/2016   MCV 89.7 10/01/2016   PLT 129 (L) 10/01/2016    Recent Labs  04/28/16 1013  NA 141  K 5.1  CL 96  CO2 26  GLUCOSE 96  BUN 14  CREATININE 0.88  CALCIUM 9.8  GFRNONAA 92  GFRAA 106  PROT 7.1  ALBUMIN 4.5  AST 18  ALT 20  ALKPHOS 59  BILITOT 0.5    ASSESSMENT & PLAN:  Acquired polycythemia The patient has acquired polycythemia secondary to poorly controlled obstructive sleep apnea I recommend phlebotomy every 4 weeks to keep hematocrit under 48 I will  go ahead and have 1 unit of blood removed, another one before Thanksgiving and then every 4 weeks indefinitely The patient is instructed to take aspirin therapy  Apnea, sleep The most likely cause of the polycythemia is due to sleep apnea I recommend he consult with his pulmonologist for possible titration/adjustment of his C Pap device  Essential hypertension His significant hypertension is likely exacerbated by severe polycythemia Hopefully phlebotomy might help with that. he will continue current medical management. I recommend close follow-up with primary care doctor for medication adjustment.    Orders Placed This Encounter  Procedures  . CBC with Differential    Standing Status:   Future    Number of Occurrences:   1    Standing Expiration Date:   10/01/2017  . CBC with Differential/Platelet    Standing Status:   Standing    Number of Occurrences:   9  Standing Expiration Date:   10/01/2017    All questions were answered. The patient knows to call the clinic with any problems, questions or concerns. I spent 15 minutes counseling the patient face to face. The total time spent in the appointment was 20 minutes and more than 50% was on counseling.     Heath Lark, MD 10/01/2016 2:22 PM

## 2016-10-01 NOTE — Assessment & Plan Note (Signed)
His significant hypertension is likely exacerbated by severe polycythemia Hopefully phlebotomy might help with that. he will continue current medical management. I recommend close follow-up with primary care doctor for medication adjustment.

## 2016-10-20 ENCOUNTER — Inpatient Hospital Stay: Payer: BLUE CROSS/BLUE SHIELD

## 2016-10-20 DIAGNOSIS — E669 Obesity, unspecified: Secondary | ICD-10-CM | POA: Diagnosis not present

## 2016-10-20 DIAGNOSIS — D751 Secondary polycythemia: Secondary | ICD-10-CM | POA: Diagnosis not present

## 2016-10-20 DIAGNOSIS — E7800 Pure hypercholesterolemia, unspecified: Secondary | ICD-10-CM | POA: Diagnosis not present

## 2016-10-20 DIAGNOSIS — R51 Headache: Secondary | ICD-10-CM | POA: Diagnosis not present

## 2016-10-20 DIAGNOSIS — Z79899 Other long term (current) drug therapy: Secondary | ICD-10-CM | POA: Diagnosis not present

## 2016-10-20 DIAGNOSIS — Z7982 Long term (current) use of aspirin: Secondary | ICD-10-CM | POA: Diagnosis not present

## 2016-10-20 DIAGNOSIS — I10 Essential (primary) hypertension: Secondary | ICD-10-CM | POA: Diagnosis not present

## 2016-10-20 DIAGNOSIS — G4730 Sleep apnea, unspecified: Secondary | ICD-10-CM | POA: Diagnosis not present

## 2016-10-20 DIAGNOSIS — Z23 Encounter for immunization: Secondary | ICD-10-CM | POA: Diagnosis not present

## 2016-10-20 DIAGNOSIS — Z87891 Personal history of nicotine dependence: Secondary | ICD-10-CM | POA: Diagnosis not present

## 2016-10-20 DIAGNOSIS — G4762 Sleep related leg cramps: Secondary | ICD-10-CM | POA: Diagnosis not present

## 2016-10-20 LAB — CBC WITH DIFFERENTIAL/PLATELET
BASOS ABS: 0.1 10*3/uL (ref 0–0.1)
Basophils Relative: 1 %
EOS ABS: 0.3 10*3/uL (ref 0–0.7)
EOS PCT: 3 %
HCT: 52.4 % — ABNORMAL HIGH (ref 40.0–52.0)
Hemoglobin: 17.7 g/dL (ref 13.0–18.0)
Lymphocytes Relative: 21 %
Lymphs Abs: 1.8 10*3/uL (ref 1.0–3.6)
MCH: 29.8 pg (ref 26.0–34.0)
MCHC: 33.8 g/dL (ref 32.0–36.0)
MCV: 88.3 fL (ref 80.0–100.0)
Monocytes Absolute: 0.7 10*3/uL (ref 0.2–1.0)
Monocytes Relative: 8 %
Neutro Abs: 5.8 10*3/uL (ref 1.4–6.5)
Neutrophils Relative %: 67 %
PLATELETS: 133 10*3/uL — AB (ref 150–440)
RBC: 5.94 MIL/uL — AB (ref 4.40–5.90)
RDW: 15.1 % — ABNORMAL HIGH (ref 11.5–14.5)
WBC: 8.7 10*3/uL (ref 3.8–10.6)

## 2016-11-17 ENCOUNTER — Inpatient Hospital Stay: Payer: BLUE CROSS/BLUE SHIELD | Attending: Hematology and Oncology

## 2016-11-17 ENCOUNTER — Inpatient Hospital Stay: Payer: BLUE CROSS/BLUE SHIELD

## 2016-11-17 DIAGNOSIS — D751 Secondary polycythemia: Secondary | ICD-10-CM

## 2016-11-17 LAB — CBC WITH DIFFERENTIAL/PLATELET
BASOS ABS: 0.1 10*3/uL (ref 0–0.1)
BASOS PCT: 1 %
Eosinophils Absolute: 0.3 10*3/uL (ref 0–0.7)
Eosinophils Relative: 3 %
HEMATOCRIT: 52.3 % — AB (ref 40.0–52.0)
HEMOGLOBIN: 17.4 g/dL (ref 13.0–18.0)
Lymphocytes Relative: 19 %
Lymphs Abs: 1.8 10*3/uL (ref 1.0–3.6)
MCH: 29 pg (ref 26.0–34.0)
MCHC: 33.2 g/dL (ref 32.0–36.0)
MCV: 87.5 fL (ref 80.0–100.0)
Monocytes Absolute: 0.9 10*3/uL (ref 0.2–1.0)
Monocytes Relative: 10 %
NEUTROS ABS: 6.5 10*3/uL (ref 1.4–6.5)
NEUTROS PCT: 67 %
Platelets: 143 10*3/uL — ABNORMAL LOW (ref 150–440)
RBC: 5.98 MIL/uL — ABNORMAL HIGH (ref 4.40–5.90)
RDW: 15.1 % — AB (ref 11.5–14.5)
WBC: 9.6 10*3/uL (ref 3.8–10.6)

## 2016-12-15 ENCOUNTER — Inpatient Hospital Stay: Payer: BLUE CROSS/BLUE SHIELD

## 2016-12-21 ENCOUNTER — Inpatient Hospital Stay: Payer: BLUE CROSS/BLUE SHIELD

## 2016-12-24 ENCOUNTER — Inpatient Hospital Stay: Payer: BLUE CROSS/BLUE SHIELD

## 2016-12-24 ENCOUNTER — Inpatient Hospital Stay: Payer: BLUE CROSS/BLUE SHIELD | Attending: Hematology and Oncology

## 2016-12-24 DIAGNOSIS — D751 Secondary polycythemia: Secondary | ICD-10-CM

## 2016-12-24 LAB — CBC WITH DIFFERENTIAL/PLATELET
BASOS PCT: 2 %
Basophils Absolute: 0.2 10*3/uL — ABNORMAL HIGH (ref 0–0.1)
EOS ABS: 0.3 10*3/uL (ref 0–0.7)
EOS PCT: 3 %
HCT: 52.8 % — ABNORMAL HIGH (ref 40.0–52.0)
HEMOGLOBIN: 17.5 g/dL (ref 13.0–18.0)
Lymphocytes Relative: 18 %
Lymphs Abs: 1.9 10*3/uL (ref 1.0–3.6)
MCH: 28.3 pg (ref 26.0–34.0)
MCHC: 33.1 g/dL (ref 32.0–36.0)
MCV: 85.4 fL (ref 80.0–100.0)
MONO ABS: 0.8 10*3/uL (ref 0.2–1.0)
MONOS PCT: 8 %
Neutro Abs: 7.6 10*3/uL — ABNORMAL HIGH (ref 1.4–6.5)
Neutrophils Relative %: 69 %
Platelets: 153 10*3/uL (ref 150–440)
RBC: 6.18 MIL/uL — ABNORMAL HIGH (ref 4.40–5.90)
RDW: 15 % — AB (ref 11.5–14.5)
WBC: 10.8 10*3/uL — ABNORMAL HIGH (ref 3.8–10.6)

## 2016-12-25 ENCOUNTER — Other Ambulatory Visit: Payer: Self-pay | Admitting: Hematology and Oncology

## 2017-01-03 ENCOUNTER — Other Ambulatory Visit: Payer: Self-pay

## 2017-01-03 MED ORDER — SIMVASTATIN 20 MG PO TABS: 20.0000 mg | ORAL_TABLET | Freq: Every day | ORAL | 4 refills | Status: DC

## 2017-01-03 MED ORDER — METOPROLOL TARTRATE 50 MG PO TABS
50.0000 mg | ORAL_TABLET | Freq: Two times a day (BID) | ORAL | 4 refills | Status: DC
Start: 2017-01-03 — End: 2017-03-08

## 2017-01-03 NOTE — Telephone Encounter (Signed)
Mail order pharmacy is requesting refills. Thanks!  

## 2017-01-04 ENCOUNTER — Other Ambulatory Visit: Payer: Self-pay | Admitting: Family Medicine

## 2017-01-04 MED ORDER — TAMSULOSIN HCL 0.4 MG PO CAPS: 0.4000 mg | ORAL_CAPSULE | Freq: Every day | ORAL | 4 refills | Status: DC

## 2017-01-12 ENCOUNTER — Inpatient Hospital Stay: Payer: BLUE CROSS/BLUE SHIELD

## 2017-01-24 ENCOUNTER — Inpatient Hospital Stay: Payer: BLUE CROSS/BLUE SHIELD

## 2017-01-24 ENCOUNTER — Inpatient Hospital Stay: Payer: BLUE CROSS/BLUE SHIELD | Attending: Hematology and Oncology | Admitting: *Deleted

## 2017-01-24 VITALS — BP 166/90 | HR 50 | Temp 98.0°F | Resp 18

## 2017-01-24 DIAGNOSIS — D751 Secondary polycythemia: Secondary | ICD-10-CM

## 2017-01-24 LAB — CBC WITH DIFFERENTIAL/PLATELET
BASOS ABS: 0.1 10*3/uL (ref 0–0.1)
BASOS PCT: 1 %
EOS ABS: 0.2 10*3/uL (ref 0–0.7)
Eosinophils Relative: 2 %
HEMATOCRIT: 48.2 % (ref 40.0–52.0)
HEMOGLOBIN: 15.8 g/dL (ref 13.0–18.0)
Lymphocytes Relative: 20 %
Lymphs Abs: 2 10*3/uL (ref 1.0–3.6)
MCH: 27.5 pg (ref 26.0–34.0)
MCHC: 32.7 g/dL (ref 32.0–36.0)
MCV: 83.9 fL (ref 80.0–100.0)
MONOS PCT: 8 %
Monocytes Absolute: 0.8 10*3/uL (ref 0.2–1.0)
NEUTROS ABS: 6.8 10*3/uL — AB (ref 1.4–6.5)
NEUTROS PCT: 69 %
Platelets: 144 10*3/uL — ABNORMAL LOW (ref 150–440)
RBC: 5.74 MIL/uL (ref 4.40–5.90)
RDW: 15 % — ABNORMAL HIGH (ref 11.5–14.5)
WBC: 9.9 10*3/uL (ref 3.8–10.6)

## 2017-01-24 NOTE — Progress Notes (Signed)
Two attempts to draw off 592ml.  Only drew 175 ml off.  Patient did not want to try again.  Said he would keep next appointment and if he has any problems he will call [facility].

## 2017-02-09 ENCOUNTER — Inpatient Hospital Stay: Payer: BLUE CROSS/BLUE SHIELD

## 2017-02-16 ENCOUNTER — Other Ambulatory Visit: Payer: Self-pay | Admitting: *Deleted

## 2017-02-16 ENCOUNTER — Inpatient Hospital Stay: Payer: BLUE CROSS/BLUE SHIELD

## 2017-02-16 ENCOUNTER — Inpatient Hospital Stay: Payer: BLUE CROSS/BLUE SHIELD | Attending: Hematology and Oncology

## 2017-02-16 VITALS — BP 163/93 | HR 56 | Temp 97.5°F | Resp 20

## 2017-02-16 DIAGNOSIS — D751 Secondary polycythemia: Secondary | ICD-10-CM | POA: Insufficient documentation

## 2017-02-16 LAB — HEMATOCRIT: HCT: 48.5 % (ref 40.0–52.0)

## 2017-02-25 DIAGNOSIS — G4733 Obstructive sleep apnea (adult) (pediatric): Secondary | ICD-10-CM | POA: Diagnosis not present

## 2017-03-08 ENCOUNTER — Encounter: Payer: Self-pay | Admitting: Physician Assistant

## 2017-03-08 ENCOUNTER — Ambulatory Visit (INDEPENDENT_AMBULATORY_CARE_PROVIDER_SITE_OTHER): Payer: BLUE CROSS/BLUE SHIELD | Admitting: Physician Assistant

## 2017-03-08 VITALS — BP 139/75 | HR 56 | Temp 98.8°F | Resp 16 | Wt 281.0 lb

## 2017-03-08 DIAGNOSIS — E7800 Pure hypercholesterolemia, unspecified: Secondary | ICD-10-CM

## 2017-03-08 DIAGNOSIS — I10 Essential (primary) hypertension: Secondary | ICD-10-CM

## 2017-03-08 DIAGNOSIS — D751 Secondary polycythemia: Secondary | ICD-10-CM | POA: Diagnosis not present

## 2017-03-08 DIAGNOSIS — N400 Enlarged prostate without lower urinary tract symptoms: Secondary | ICD-10-CM | POA: Diagnosis not present

## 2017-03-08 MED ORDER — METOPROLOL TARTRATE 50 MG PO TABS: 50.0000 mg | ORAL_TABLET | Freq: Two times a day (BID) | ORAL | 3 refills | Status: DC

## 2017-03-08 MED ORDER — TAMSULOSIN HCL 0.4 MG PO CAPS: 0.8000 mg | ORAL_CAPSULE | Freq: Every day | ORAL | 1 refills | Status: AC

## 2017-03-08 MED ORDER — SIMVASTATIN 20 MG PO TABS
20.0000 mg | ORAL_TABLET | Freq: Every day | ORAL | 3 refills | Status: DC
Start: 2017-03-08 — End: 2017-12-20

## 2017-03-08 MED ORDER — LISINOPRIL-HYDROCHLOROTHIAZIDE 20-12.5 MG PO TABS: 1.0000 | ORAL_TABLET | Freq: Every day | ORAL | 3 refills | Status: DC

## 2017-03-08 NOTE — Progress Notes (Signed)
Patient: Jason Bolton Male    DOB: 1956/05/17   61 y.o.   MRN: [id number] Visit Date: 03/08/2017  Today's Provider: Trinna Post, PA-C   Chief Complaint  Patient presents with  . Hypertension  . Hyperlipidemia  . Allergic Rhinitis    Subjective:    Hypertension  This is a chronic problem. The problem has been gradually improving since onset. The problem is controlled (Pt says is varies throughout the day.  He reports for the most part it is controlled. ). Associated symptoms include headaches (Rarely). Pertinent negatives include no anxiety, blurred vision, chest pain, malaise/fatigue, neck pain, orthopnea, palpitations, peripheral edema, PND, shortness of breath or sweats. There are no compliance problems.   Hyperlipidemia  This is a chronic problem. The problem is controlled. Recent lipid tests were reviewed and are normal. Pertinent negatives include no chest pain or shortness of breath. Current antihyperlipidemic treatment includes statins. There are no compliance problems.    BPH  Patient has been treating benign prostatic hypertrophy with Flomax 0.4mg . He takes this at night with moderate success but with urinary hesitancy and difficulty starting stream. Would like to increase this dose.   Acquired Polycythemia  Sees oncology for this, they draw CBC and will take blood if HCT >48. Per note review, 2/2 smoking and sleep apnea. Patient wearing CPAP with good success. Has upcoming appointment with them.   Lab Results  Component Value Date   CHOL 131 04/28/2016   CHOL 164 01/15/2015   Lab Results  Component Value Date   HDL 41 04/28/2016   HDL 46 01/15/2015   Lab Results  Component Value Date   LDLCALC 72 04/28/2016   LDLCALC 101 01/15/2015   Lab Results  Component Value Date   TRIG 89 04/28/2016   TRIG 87 01/15/2015   Lab Results  Component Value Date   CHOLHDL 3.2 04/28/2016   No results found for: LDLDIRECT    Allergies  Allergen Reactions    . Penicillins Other (See Comments)    unknown     Current Outpatient Prescriptions:  .  aspirin 81 MG tablet, Take 81 mg by mouth daily., Disp: , Rfl:  .  Coenzyme Q10 50 MG CAPS, CO ENZYME Q-10, 50MG  (Oral Capsule)  1 po qd for 0 days  Quantity: 30.00;  Refills: 0   Ordered :31-Aug-2010  Margarita Rana MD;  Started 31-Mar-2009 Active Comments: DX: 272.0, Disp: , Rfl:  .  lisinopril-hydrochlorothiazide (ZESTORETIC) 20-12.5 MG tablet, Take 1 tablet by mouth daily., Disp: 90 tablet, Rfl: 3 .  metoprolol (LOPRESSOR) 50 MG tablet, Take 1 tablet (50 mg total) by mouth 2 (two) times daily., Disp: 180 tablet, Rfl: 3 .  OMEGA-3 FATTY ACIDS PO, FISH-EPA, 1000MG  (Oral Capsule)  2 po bid for 0 days  Quantity: 0.00;  Refills: 0   Ordered :31-Aug-2010  Margarita Rana MD;  Started 19-June-2007 Active Comments: DX: 272.0, Disp: , Rfl:  .  sildenafil (VIAGRA) 50 MG tablet, Take 1 tablet (50 mg total) by mouth as needed for erectile dysfunction. Reported on 03/15/2016, Disp: 8 tablet, Rfl: 3 .  simvastatin (ZOCOR) 20 MG tablet, Take 1 tablet (20 mg total) by mouth at bedtime., Disp: 90 tablet, Rfl: 3 .  tamsulosin (FLOMAX) 0.4 MG CAPS capsule, Take 2 capsules (0.8 mg total) by mouth at bedtime., Disp: 180 capsule, Rfl: 1  Review of Systems  Constitutional: Negative.  Negative for malaise/fatigue.  Eyes: Negative for blurred vision.  Respiratory: Negative.  Negative for shortness of breath.   Cardiovascular: Negative.  Negative for chest pain, palpitations, orthopnea and PND.  Gastrointestinal: Negative.   Musculoskeletal: Negative.  Negative for neck pain.  Allergic/Immunologic: Positive for environmental allergies.  Neurological: Positive for headaches (Rarely). Negative for dizziness, weakness, light-headedness and numbness.    Social History  Substance Use Topics  . Smoking status: Former Smoker    Packs/day: 1.50    Years: 20.00    Types: Cigarettes    Quit date: 11/29/2015  . Smokeless tobacco:  Never Used     Comment: quit June of 2008 after [employer]. he was exposed to secondary smoke  . Alcohol use 0.0 oz/week     Comment: occasional   Objective:   BP 139/75   Pulse (!) 56   Temp 98.8 F (37.1 C) (Oral)   Resp 16   Wt 281 lb (127.5 kg)   BMI 37.07 kg/m  Vitals:   03/08/17 1036 03/08/17 1231  BP: (!) 150/74 139/75  Pulse: (!) 56   Resp: 16   Temp: 98.8 F (37.1 C)   TempSrc: Oral   Weight: 281 lb (127.5 kg)     Depression screen [facility] 2/9 03/08/2017  Decreased Interest 0  Down, Depressed, Hopeless 0  PHQ - 2 Score 0  Altered sleeping 0  Tired, decreased energy 0  Change in appetite 0  Feeling bad or failure about yourself  0  Trouble concentrating 0  Moving slowly or fidgety/restless 0  Suicidal thoughts 0  PHQ-9 Score 0     Physical Exam  Constitutional: He appears well-developed and well-nourished.  Cardiovascular: Normal rate and regular rhythm.   Pulmonary/Chest: Effort normal and breath sounds normal.  Abdominal: Soft. Bowel sounds are normal.  Skin: Skin is warm and dry.  Psychiatric: He has a normal mood and affect. His behavior is normal.        Assessment & Plan:     1. Essential (primary) hypertension  Normotensive today. Refills and labs as below.  - lisinopril-hydrochlorothiazide (ZESTORETIC) 20-12.5 MG tablet; Take 1 tablet by mouth daily.  Dispense: 90 tablet; Refill: 3 - metoprolol (LOPRESSOR) 50 MG tablet; Take 1 tablet (50 mg total) by mouth 2 (two) times daily.  Dispense: 180 tablet; Refill: 3 - Comprehensive metabolic panel  2. Hypercholesterolemia without hypertriglyceridemia  Labs and refills as below.  - simvastatin (ZOCOR) 20 MG tablet; Take 1 tablet (20 mg total) by mouth at bedtime.  Dispense: 90 tablet; Refill: 3 - Lipid Profile  3. Benign prostatic hyperplasia, unspecified whether lower urinary tract symptoms present  Patient would like to increase. Can go up to 0.8mg  at night. Cautioned about orthostatic  hypotension, especially with BP medication. Advised to push fluids and call us back with any problems. Patient should be scheduling physical in the next 2 months or so and we can reassess this again at this point.  - tamsulosin (FLOMAX) 0.4 MG CAPS capsule; Take 2 capsules (0.8 mg total) by mouth at bedtime.  Dispense: 180 capsule; Refill: 1  4. Acquired polycythemia  Sees oncology, last HCT normal. Will not order CBC as he gets one drawn every month.  Return if symptoms worsen or fail to improve, for CPE .  The entirety of the information documented in the History of Present Illness, Review of Systems and Physical Exam were personally obtained by me. Portions of this information were initially documented by Ashley Royalty, CMA and reviewed by me for thoroughness and accuracy.  Trinna Post, PA-C  [facility]

## 2017-03-08 NOTE — Patient Instructions (Signed)
Hypertension °Hypertension is another name for high blood pressure. High blood pressure forces your heart to work harder to pump blood. This can cause problems over time. °There are two numbers in a blood pressure reading. There is a top number (systolic) over a bottom number (diastolic). It is best to have a blood pressure below 120/80. Healthy choices can help lower your blood pressure. You may need medicine to help lower your blood pressure if: °· Your blood pressure cannot be lowered with healthy choices. °· Your blood pressure is higher than 130/80. °Follow these instructions at home: °Eating and drinking  °· If directed, follow the DASH eating plan. This diet includes: °¨ Filling half of your plate at each meal with fruits and vegetables. °¨ Filling one quarter of your plate at each meal with whole grains. Whole grains include whole wheat pasta, brown rice, and whole grain bread. °¨ Eating or drinking low-fat dairy products, such as skim milk or low-fat yogurt. °¨ Filling one quarter of your plate at each meal with low-fat (lean) proteins. Low-fat proteins include fish, skinless chicken, eggs, beans, and tofu. °¨ Avoiding fatty meat, cured and processed meat, or chicken with skin. °¨ Avoiding premade or processed food. °· Eat less than 1,500 mg of salt (sodium) a day. °· Limit alcohol use to no more than 1 drink a day for nonpregnant women and 2 drinks a day for men. One drink equals 12 oz of beer, 5 oz of wine, or 1½ oz of hard liquor. °Lifestyle  °· Work with your doctor to stay at a healthy weight or to lose weight. Ask your doctor what the best weight is for you. °· Get at least 30 minutes of exercise that causes your heart to beat faster (aerobic exercise) most days of the week. This may include walking, swimming, or biking. °· Get at least 30 minutes of exercise that strengthens your muscles (resistance exercise) at least 3 days a week. This may include lifting weights or pilates. °· Do not use any  products that contain nicotine or tobacco. This includes cigarettes and e-cigarettes. If you need help quitting, ask your doctor. °· Check your blood pressure at home as told by your doctor. °· Keep all follow-up visits as told by your doctor. This is important. °Medicines  °· Take over-the-counter and prescription medicines only as told by your doctor. Follow directions carefully. °· Do not skip doses of blood pressure medicine. The medicine does not work as well if you skip doses. Skipping doses also puts you at risk for problems. °· Ask your doctor about side effects or reactions to medicines that you should watch for. °Contact a doctor if: °· You think you are having a reaction to the medicine you are taking. °· You have headaches that keep coming back (recurring). °· You feel dizzy. °· You have swelling in your ankles. °· You have trouble with your vision. °Get help right away if: °· You get a very bad headache. °· You start to feel confused. °· You feel weak or numb. °· You feel faint. °· You get very bad pain in your: °¨ Chest. °¨ Belly (abdomen). °· You throw up (vomit) more than once. °· You have trouble breathing. °Summary °· Hypertension is another name for high blood pressure. °· Making healthy choices can help lower blood pressure. If your blood pressure cannot be controlled with healthy choices, you may need to take medicine. °This information is not intended to replace advice given to you by your   health care provider. Make sure you discuss any questions you have with your health care provider. °Document Released: 05/03/2008 Document Revised: 10/13/2016 Document Reviewed: 10/13/2016 °Elsevier Interactive Patient Education © 2017 [employer] ° °

## 2017-03-09 ENCOUNTER — Inpatient Hospital Stay: Payer: BLUE CROSS/BLUE SHIELD

## 2017-03-15 ENCOUNTER — Inpatient Hospital Stay: Payer: BLUE CROSS/BLUE SHIELD | Attending: Hematology and Oncology | Admitting: *Deleted

## 2017-03-15 ENCOUNTER — Inpatient Hospital Stay: Payer: BLUE CROSS/BLUE SHIELD

## 2017-03-15 DIAGNOSIS — D751 Secondary polycythemia: Secondary | ICD-10-CM | POA: Diagnosis not present

## 2017-03-15 LAB — HEMATOCRIT: HCT: 47 % (ref 40.0–52.0)

## 2017-03-22 DIAGNOSIS — E7800 Pure hypercholesterolemia, unspecified: Secondary | ICD-10-CM | POA: Diagnosis not present

## 2017-03-22 DIAGNOSIS — I10 Essential (primary) hypertension: Secondary | ICD-10-CM | POA: Diagnosis not present

## 2017-03-23 ENCOUNTER — Telehealth: Payer: Self-pay

## 2017-03-23 LAB — COMPREHENSIVE METABOLIC PANEL
ALT: 24 IU/L (ref 0–44)
AST: 22 IU/L (ref 0–40)
Albumin/Globulin Ratio: 1.6 (ref 1.2–2.2)
Albumin: 4.2 g/dL (ref 3.6–4.8)
Alkaline Phosphatase: 47 IU/L (ref 39–117)
BUN/Creatinine Ratio: 19 (ref 10–24)
BUN: 18 mg/dL (ref 8–27)
Bilirubin Total: 0.3 mg/dL (ref 0.0–1.2)
CO2: 30 mmol/L — ABNORMAL HIGH (ref 18–29)
Calcium: 9.4 mg/dL (ref 8.6–10.2)
Chloride: 96 mmol/L (ref 96–106)
Creatinine, Ser: 0.94 mg/dL (ref 0.76–1.27)
GFR calc Af Amer: 99 mL/min/{1.73_m2} (ref >59)
GFR calc non Af Amer: 86 mL/min/{1.73_m2} (ref >59)
Globulin, Total: 2.6 g/dL (ref 1.5–4.5)
Glucose: 110 mg/dL — ABNORMAL HIGH (ref 65–99)
Potassium: 4.7 mmol/L (ref 3.5–5.2)
Sodium: 140 mmol/L (ref 134–144)
Total Protein: 6.8 g/dL (ref 6.0–8.5)

## 2017-03-23 LAB — LIPID PANEL
Chol/HDL Ratio: 4.5 ratio (ref 0.0–5.0)
Cholesterol, Total: 167 mg/dL (ref 100–199)
HDL: 37 mg/dL — ABNORMAL LOW (ref >39)
LDL Calculated: 99 mg/dL (ref 0–99)
Triglycerides: 155 mg/dL — ABNORMAL HIGH (ref 0–149)
VLDL Cholesterol Cal: 31 mg/dL (ref 5–40)

## 2017-03-23 NOTE — Telephone Encounter (Signed)
Pt advised.   Thanks,   -Yahia Bottger  

## 2017-03-23 NOTE — Telephone Encounter (Signed)
-----   Message from Trinna Post, [place] sent at 03/23/2017  8:44 AM EDT ----- Fasting glucose is slightly elevated. Cholesterol and triglycerides have gone up. We can get POCT A1C at his next visit. Please work on health diet and increasing exercise.

## 2017-04-08 ENCOUNTER — Other Ambulatory Visit: Payer: Self-pay | Admitting: *Deleted

## 2017-04-08 ENCOUNTER — Ambulatory Visit: Payer: BLUE CROSS/BLUE SHIELD | Admitting: Hematology and Oncology

## 2017-04-08 ENCOUNTER — Other Ambulatory Visit: Payer: BLUE CROSS/BLUE SHIELD

## 2017-04-08 DIAGNOSIS — D45 Polycythemia vera: Secondary | ICD-10-CM

## 2017-04-14 ENCOUNTER — Inpatient Hospital Stay: Payer: BLUE CROSS/BLUE SHIELD | Attending: Hematology and Oncology

## 2017-04-14 ENCOUNTER — Inpatient Hospital Stay: Payer: BLUE CROSS/BLUE SHIELD

## 2017-04-14 ENCOUNTER — Inpatient Hospital Stay (HOSPITAL_BASED_OUTPATIENT_CLINIC_OR_DEPARTMENT_OTHER): Payer: BLUE CROSS/BLUE SHIELD | Admitting: Hematology and Oncology

## 2017-04-14 ENCOUNTER — Encounter: Payer: Self-pay | Admitting: Hematology and Oncology

## 2017-04-14 VITALS — BP 153/86 | HR 55 | Temp 97.8°F | Wt 282.4 lb

## 2017-04-14 DIAGNOSIS — D751 Secondary polycythemia: Secondary | ICD-10-CM | POA: Diagnosis not present

## 2017-04-14 DIAGNOSIS — E7800 Pure hypercholesterolemia, unspecified: Secondary | ICD-10-CM | POA: Insufficient documentation

## 2017-04-14 DIAGNOSIS — I10 Essential (primary) hypertension: Secondary | ICD-10-CM | POA: Insufficient documentation

## 2017-04-14 DIAGNOSIS — Z87891 Personal history of nicotine dependence: Secondary | ICD-10-CM | POA: Insufficient documentation

## 2017-04-14 DIAGNOSIS — G4730 Sleep apnea, unspecified: Secondary | ICD-10-CM | POA: Insufficient documentation

## 2017-04-14 DIAGNOSIS — D45 Polycythemia vera: Secondary | ICD-10-CM

## 2017-04-14 DIAGNOSIS — Z7982 Long term (current) use of aspirin: Secondary | ICD-10-CM | POA: Diagnosis not present

## 2017-04-14 LAB — HEMATOCRIT: HCT: 48.1 % (ref 40.0–52.0)

## 2017-04-14 NOTE — Progress Notes (Signed)
[facility] day:  04/14/2017  Chief Complaint: MATAIO MELE is a 61 y.o. male with secondary polycythemia who is seen for reassessment.  HPI:  The patient was previously seen by Dr. Inez Pilgrim 4-5 years ago. He was felt to have secondary polycythemia due to morbid obesity and obstructive sleep apnea. Initial labs ruled out polycythemia rubra vera. JAK2 V617F and exon 12 were negative. Erythropoietin level was negative.  The patient initially underwent phlebotomy every 4-6 weeks.  Goal has been to keep his hematocrit < 48.  He recently underwent phlebotomy on 10/20/2016, 11/17/2016, 12/24/2016, 01/24/2017, and 02/16/2017.  Hematocrit was 47 on 03/15/2017.  He did not undergo phlebotomy.  He stopped smoking 2 years ago. He has sleep apnea and is on a CPAP. His CPAP machine works fine, but he plans to replace it. He were feels refreshed in the morning. He is not on testosterone.  He was last seen by Dr. Alvy Bimler on 10/01/2016.  CBC revealed a hematocrit of 58.9, hemoglobin 19.2, MCV 89.7, platelets 129,000, WBC 9700 with an ANC of 6600.  Symptomatically, he feels better after phlebotomy.  He has a dull headache prior to phlebotomy.  He denies any chest pain or shortness of breath.   Past Medical History:  Diagnosis Date  . Allergy   . Hypercholesterolemia   . Hypertension   . 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 Polycythemia   . Sleep apnea     Past Surgical History:  Procedure Laterality Date  . CATARACT EXTRACTION Right 11/2013  . COLONOSCOPY  2006  . INGUINAL HERNIA REPAIR Right 1972  . MANDIBLE SURGERY  2005    Family History  Problem Relation Age of Onset  . Diabetes Mother   . Congestive Heart Failure Mother     Social History:  reports that he quit smoking about 16 months ago. His smoking use included Cigarettes. He has a 30.00 pack-year smoking history. He has never used smokeless tobacco.  He reports that he drinks alcohol. His drug history is not on file.  He stopped smoking 2 years ago.  He has not had any exposure to formaldehyde in 20 years.  He runs 2 funeral homes.  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  Allergies:  Allergies  Allergen Reactions  . Penicillins Other (See Comments)    unknown    Current Medications: Current Outpatient Prescriptions  Medication Sig Dispense Refill  . aspirin 81 MG tablet Take 81 mg by mouth daily.    . Coenzyme Q10 50 MG CAPS CO ENZYME Q-10, 50MG  (Oral Capsule)  1 po qd for 0 days  Quantity: 30.00;  Refills: 0   Ordered :31-Aug-2010  Margarita Rana MD;  Started 31-Mar-2009 Active Comments: DX: 272.0    . lisinopril-hydrochlorothiazide (ZESTORETIC) 20-12.5 MG tablet Take 1 tablet by mouth daily. 90 tablet 3  . metoprolol (LOPRESSOR) 50 MG tablet Take 1 tablet (50 mg total) by mouth 2 (two) times daily. 180 tablet 3  . OMEGA-3 FATTY ACIDS PO FISH-EPA, 1000MG  (Oral Capsule)  2 po bid for 0 days  Quantity: 0.00;  Refills: 0   Ordered :31-Aug-2010  Margarita Rana MD;  Started 19-June-2007 Active Comments: DX: 272.0    . sildenafil (VIAGRA) 50 MG tablet Take 1 tablet (50 mg total) by mouth as needed for erectile dysfunction. Reported on 03/15/2016 8 tablet 3  . simvastatin (ZOCOR) 20 MG tablet Take 1 tablet (20 mg total) by mouth  at bedtime. 90 tablet 3  . tamsulosin (FLOMAX) 0.4 MG CAPS capsule Take 2 capsules (0.8 mg total) by mouth at bedtime. 180 capsule 1   No current facility-administered medications for this visit.     Review of Systems:  GENERAL:  Feels good.  No fevers, sweats or weight loss. PERFORMANCE STATUS (ECOG):  1 HEENT:  No visual changes, runny nose, sore throat, mouth sores or tenderness. Lungs: No shortness of breath or cough.  No hemoptysis. Cardiac:  No chest pain, palpitations, orthopnea, or PND. GI:  No nausea, vomiting, diarrhea, constipation, melena or hematochezia.  Last colonoscopy 10 years ago. GU:  No urgency, frequency,  dysuria, or hematuria. Musculoskeletal:  No back pain.  No joint pain.  No muscle tenderness. Extremities:  No pain or swelling. Skin:  No rashes or skin changes. Neuro:  Dull headache with increased hematocrit.  No numbness or weakness, balance or coordination issues. Endocrine:  No diabetes, thyroid issues, hot flashes or night sweats. Psych:  No mood changes, depression or anxiety. Pain:  No focal pain. Review of systems:  All other systems reviewed and found to be negative.  Physical Exam: Blood pressure (!) 153/86, pulse (!) 55, temperature 97.8 F (36.6 C), temperature source Tympanic, weight 282 lb 7 oz (128.1 kg). GENERAL:  Well developed, well nourished, heavyset sitting comfortably in the exam room in no acute distress. MENTAL STATUS:  Alert and oriented to person, place and time. HEAD:  Lilyan Punt and mustache.  Normocephalic, atraumatic, face symmetric, no Cushingoid features. EYES:  Blue eyes.  Pharynx clear without lesion.  Tongue normal. Mucous membranes moist.  RESPIRATORY:  Clear to auscultation without rales, wheezes or rhonchi. CARDIOVASCULAR:  Regular rate and rhythm without murmur, rub or gallop. ABDOMEN:  Soft, non-tender, with active bowel sounds, and no hepatosplenomegaly.  No masses. SKIN:  No rashes, ulcers or lesions. EXTREMITIES: No edema, no skin discoloration or tenderness.  No palpable cords. LYMPH NODES: No palpable cervical, supraclavicular, axillary or inguinal adenopathy  NEUROLOGICAL: Unremarkable. PSYCH:  Appropriate.   Appointment on 04/14/2017  Component Date Value Ref Range Status  . HCT 04/14/2017 48.1  40.0 - 52.0 % Final    Assessment:  ROCKLAND KOTARSKI is a 61 y.o. 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   Etiology is felt secondary to sleep apnea.  He has a CPAP machine.  He has a 30 pack year smoking history.  He stopped smoking in 2016.  He does not use testosterone.  Initial labs ruled out polycythemia rubra vera. JAK2 V617F and  exon 12 were negative.  Erythropoietin level was normal.  He is on a baby aspirin.  He undergoes phlebotomy to keep his hematocrit < 48.  Last phlebotomy was on 02/16/2017.    Symptomatically, he experiences a dull headache when his hematocrit is high.  Exam is unremarkable.  Plan: 1.  Discuss diagnosis and management of secondary polycythemia. 2.  Labs today: hematocrit. 3.  No phlebotomy today 4.  RTC monthly for HCT +/- phlebotomy. 5.  Next month add to labs (ferritin, iron studies). 6.  RTC in 6 months for MD assessment, labs (CBC with diff, ferritin) +/- phlebotomy.   Lequita Asal, MD  04/14/2017, 2:05 PM

## 2017-04-14 NOTE — Progress Notes (Signed)
Patient offers no complaints today.  Patient's BP slightly elevated today.  States PCP is working on adjusting medications.  Was seen recently and has a follow up next month.

## 2017-05-12 ENCOUNTER — Inpatient Hospital Stay: Payer: BLUE CROSS/BLUE SHIELD

## 2017-05-12 ENCOUNTER — Other Ambulatory Visit: Payer: Self-pay

## 2017-05-12 ENCOUNTER — Inpatient Hospital Stay: Payer: BLUE CROSS/BLUE SHIELD | Attending: Hematology and Oncology

## 2017-05-12 DIAGNOSIS — D751 Secondary polycythemia: Secondary | ICD-10-CM | POA: Insufficient documentation

## 2017-05-12 LAB — IRON AND TIBC
Iron: 44 ug/dL — ABNORMAL LOW (ref 45–182)
Saturation Ratios: 9 % — ABNORMAL LOW (ref 17.9–39.5)
TIBC: 472 ug/dL — ABNORMAL HIGH (ref 250–450)
UIBC: 428 ug/dL

## 2017-05-12 LAB — HEMATOCRIT: HCT: 50 % (ref 40.0–52.0)

## 2017-05-12 LAB — FERRITIN: Ferritin: 6 ng/mL — ABNORMAL LOW (ref 24–336)

## 2017-06-07 ENCOUNTER — Other Ambulatory Visit: Payer: Self-pay | Admitting: Physician Assistant

## 2017-06-07 ENCOUNTER — Encounter: Payer: Self-pay | Admitting: *Deleted

## 2017-06-07 ENCOUNTER — Encounter: Payer: Self-pay | Admitting: Physician Assistant

## 2017-06-07 ENCOUNTER — Ambulatory Visit (INDEPENDENT_AMBULATORY_CARE_PROVIDER_SITE_OTHER): Payer: BLUE CROSS/BLUE SHIELD | Admitting: Physician Assistant

## 2017-06-07 VITALS — BP 128/78 | HR 64 | Resp 16 | Ht 73.0 in | Wt 281.0 lb

## 2017-06-07 DIAGNOSIS — Z23 Encounter for immunization: Secondary | ICD-10-CM

## 2017-06-07 DIAGNOSIS — N529 Male erectile dysfunction, unspecified: Secondary | ICD-10-CM

## 2017-06-07 DIAGNOSIS — Z1211 Encounter for screening for malignant neoplasm of colon: Secondary | ICD-10-CM | POA: Diagnosis not present

## 2017-06-07 DIAGNOSIS — Z125 Encounter for screening for malignant neoplasm of prostate: Secondary | ICD-10-CM

## 2017-06-07 DIAGNOSIS — I10 Essential (primary) hypertension: Secondary | ICD-10-CM | POA: Diagnosis not present

## 2017-06-07 DIAGNOSIS — R739 Hyperglycemia, unspecified: Secondary | ICD-10-CM | POA: Diagnosis not present

## 2017-06-07 DIAGNOSIS — E7800 Pure hypercholesterolemia, unspecified: Secondary | ICD-10-CM | POA: Diagnosis not present

## 2017-06-07 DIAGNOSIS — N400 Enlarged prostate without lower urinary tract symptoms: Secondary | ICD-10-CM

## 2017-06-07 DIAGNOSIS — Z0000 Encounter for general adult medical examination without abnormal findings: Secondary | ICD-10-CM

## 2017-06-07 MED ORDER — SILDENAFIL CITRATE 50 MG PO TABS: 50.0000 mg | ORAL_TABLET | ORAL | 3 refills | Status: DC | PRN

## 2017-06-07 MED ORDER — LISINOPRIL-HYDROCHLOROTHIAZIDE 20-12.5 MG PO TABS: 1.0000 | ORAL_TABLET | Freq: Every day | ORAL | 0 refills | Status: DC

## 2017-06-07 MED ORDER — LISINOPRIL-HYDROCHLOROTHIAZIDE 20-12.5 MG PO TABS: 1.0000 | ORAL_TABLET | Freq: Every day | ORAL | 1 refills | Status: DC

## 2017-06-07 NOTE — Progress Notes (Signed)
Patient: Jason Bolton, Male    DOB: 1956/10/20, 61 y.o.   MRN: [id number] Visit Date: 06/07/2017  Today's Provider: Trinna Post, PA-C   Chief Complaint  Patient presents with  . Annual Exam   Subjective:    Annual physical exam Jason Bolton is a 61 y.o. male who presents today for health maintenance and complete physical. He feels well. He reports exercising some. He reports he is sleeping well.  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  Two children, Claiborne Billings age 80. Skipper age 83, Four grandchildren.   Not smoking now, quit one year ago, sneaks a cigarette during stress 18-20 years: 1 pack a day  Alcohol: 6 drinks per week, 2 beers at a time, some weeks won't drink for the whole week Social drinker, 2 drinks No drugs.  Good success with BPH with increased 0.8 mg Flomax  No history of prostate or colon cancer.   Cutting back on bread, pastas. Not drinking soda, drinking unsweet tea Rarely fried foods Grilled, baked, boiled most foods  Needs refill on Viagra. -----------------------------------------------------------------   Review of Systems  Constitutional: Negative.   HENT: Negative.   Eyes: Negative.   Respiratory: Positive for apnea.   Cardiovascular: Negative.   Gastrointestinal: Negative.   Endocrine: Negative.   Genitourinary: Negative.   Musculoskeletal: Negative.   Allergic/Immunologic: Positive for environmental allergies.  Neurological: Negative.   Hematological: Negative.   Psychiatric/Behavioral: Negative.     Social History      He  reports that he quit smoking about 18 months ago. His smoking use included Cigarettes. He has a 30.00 pack-year smoking history. He has never used smokeless tobacco. He reports that he drinks alcohol.       Social History   Social History  . Marital status: Married    Spouse name: N/A  . Number of children:  2  . Years of education: N/A   Occupational History  . supervisor    Social History Main Topics  . Smoking status: Former Smoker    Packs/day: 1.50    Years: 20.00    Types: Cigarettes    Quit date: 11/29/2015  . Smokeless tobacco: Never Used     Comment: quit June of 2008 after Chantix. he was exposed to secondary smoke  . Alcohol use 0.0 oz/week     Comment: occasional  . Drug use: Unknown  . Sexual activity: Not Asked   Other Topics Concern  . None   Social History Narrative  . None    Past Medical History:  Diagnosis Date  . Allergy   . Hypercholesterolemia   . Hypertension   . 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 Polycythemia   . Sleep apnea      Patient Active Problem List   Diagnosis Date Noted  . Dysuria 10/01/2016  . Essential hypertension 10/01/2016  . BMI 36.0-36.9,adult 03/15/2016  . Obesity 03/15/2016  . Baker cyst 04/29/2015  . Fecal occult blood test positive 04/29/2015  . Apnea, sleep 04/29/2015  . Hay fever 02/17/2009  . Benign prostatic hyperplasia 02/17/2009  . ED (erectile dysfunction) of organic origin 04/21/2007  . Compulsive tobacco user syndrome 04/21/2007  . Essential (primary) hypertension 04/19/2007  . Acquired polycythemia 04/19/2007  . Hypercholesterolemia without hypertriglyceridemia 05/19/2006    Past Surgical History:  Procedure Laterality Date  . CATARACT  EXTRACTION Right 11/2013  . COLONOSCOPY  2006  . INGUINAL HERNIA REPAIR Right 1972  . MANDIBLE SURGERY  2005    Family History        Family Status  Relation Status  . Mother Deceased  . Father Deceased  . Sister Alive  . Brother Alive  . Brother Alive  . Brother Alive        His family history includes Congestive Heart Failure in his mother; Diabetes in his mother; Healthy in his brother, brother, brother, and sister; Pancreatic cancer in his father.     Allergies  Allergen Reactions  . Penicillins Other (See  Comments)    unknown     Current Outpatient Prescriptions:  .  aspirin 81 MG tablet, Take 81 mg by mouth daily., Disp: , Rfl:  .  Coenzyme Q10 50 MG CAPS, CO ENZYME Q-10, 50MG  (Oral Capsule)  1 po qd for 0 days  Quantity: 30.00;  Refills: 0   Ordered :31-Aug-2010  Margarita Rana MD;  Started 31-Mar-2009 Active Comments: DX: 272.0, Disp: , Rfl:  .  lisinopril-hydrochlorothiazide (ZESTORETIC) 20-12.5 MG tablet, Take 1 tablet by mouth daily., Disp: 30 tablet, Rfl: 0 .  metoprolol (LOPRESSOR) 50 MG tablet, Take 1 tablet (50 mg total) by mouth 2 (two) times daily., Disp: 180 tablet, Rfl: 3 .  OMEGA-3 FATTY ACIDS PO, FISH-EPA, 1000MG  (Oral Capsule)  2 po bid for 0 days  Quantity: 0.00;  Refills: 0   Ordered :31-Aug-2010  Margarita Rana MD;  Started 19-June-2007 Active Comments: DX: 272.0, Disp: , Rfl:  .  sildenafil (VIAGRA) 50 MG tablet, Take 1 tablet (50 mg total) by mouth as needed for erectile dysfunction. Reported on 03/15/2016, Disp: 8 tablet, Rfl: 3 .  simvastatin (ZOCOR) 20 MG tablet, Take 1 tablet (20 mg total) by mouth at bedtime., Disp: 90 tablet, Rfl: 3 .  tamsulosin (FLOMAX) 0.4 MG CAPS capsule, Take 2 capsules (0.8 mg total) by mouth at bedtime., Disp: 180 capsule, Rfl: 1   Patient Care Team: Birdie Sons, MD as PCP - General (Family Medicine)      Objective:   Vitals: BP 128/78 (BP Location: Left Arm, Patient Position: Sitting, Cuff Size: Large)   Pulse 64   Resp 16   Ht 6\' 1"  (1.854 m)   Wt 281 lb (127.5 kg)   SpO2 96%   BMI 37.07 kg/m    Vitals:   06/07/17 0920  BP: 128/78  Pulse: 64  Resp: 16  SpO2: 96%  Weight: 281 lb (127.5 kg)  Height: 6\' 1"  (1.854 m)     Physical Exam  Constitutional: He is oriented to person, place, and time. He appears well-developed and well-nourished.  Neck: Neck supple.  Cardiovascular: Normal rate and regular rhythm.   Pulmonary/Chest: Effort normal and breath sounds normal.  Abdominal: Soft. Bowel sounds are normal.    Lymphadenopathy:    He has no cervical adenopathy.  Neurological: He is alert and oriented to person, place, and time.  Skin: Skin is warm and dry.  Psychiatric: He has a normal mood and affect. His behavior is normal.     Depression Screen PHQ 2/9 Scores 03/08/2017  PHQ - 2 Score 0  PHQ- 9 Score 0      Assessment & Plan:     Routine Health Maintenance and Physical Exam  Exercise Activities and Dietary recommendations Goals    None      Immunization History  Administered Date(s) Administered  . Influenza,inj,Quad PF,36+ Mos 10/01/2016  .  Influenza-Unspecified 09/22/2015  . Pneumococcal Conjugate-13 01/15/2015  . Tdap 04/19/2007    Health Maintenance  Topic Date Due  . Hepatitis C Screening  27-Apr-1956  . HIV Screening  12/17/1968  . COLONOSCOPY  07/23/2014  . TETANUS/TDAP  04/18/2017  . INFLUENZA VACCINE  06/29/2017     Discussed health benefits of physical activity, and encouraged him to engage in regular exercise appropriate for his age and condition.   1. Annual physical exam   2. Essential (primary) hypertension  Stable today. 30 days sent to [facility] because ran out of script from mail order.   - lisinopril-hydrochlorothiazide (ZESTORETIC) 20-12.5 MG tablet; Take 1 tablet by mouth daily.  Dispense: 90 tablet; Refill: 1  3. ED (erectile dysfunction) of organic origin  - sildenafil (VIAGRA) 50 MG tablet; Take 1 tablet (50 mg total) by mouth as needed for erectile dysfunction. Reported on 03/15/2016  Dispense: 8 tablet; Refill: 3  4. Benign prostatic hyperplasia, unspecified whether lower urinary tract symptoms present  Better success on 0.8 mg Flomax.  5. Hypercholesterolemia without hypertriglyceridemia  Lipids checked in April, increased but still stable. Continue cholesterol medication.  6. Colon cancer screening  Prefers Dr. Fleet Contras who did his last colonoscopy in 2005.   - Ambulatory referral to General Surgery  7. Need for Td  vaccine  Updated today.   - Td : Tetanus/diphtheria >7yo Preservative  free  8. Prostate cancer screening  PSA in 2017 normal. No hx prostate cancer, will get next year.   9. Hyperglycemia  Last fasting glucose 110.   - Hemoglobin A1c  Return in about 6 months (around 12/08/2017) for HTN, HLD.  The entirety of the information documented in the History of Present Illness, Review of Systems and Physical Exam were personally obtained by me. Portions of this information were initially documented by Ashley Royalty, CMA and reviewed by me for thoroughness and accuracy.    --------------------------------------------------------------------    Trinna Post, PA-C  [facility]

## 2017-06-07 NOTE — Patient Instructions (Signed)
[employer]   Health Maintenance, Male A healthy lifestyle and preventive care is important for your health and wellness. Ask your health care provider about what schedule of regular examinations is right for you. What should I know about weight and diet? Eat a Healthy Diet  Eat plenty of vegetables, fruits, whole grains, low-fat dairy products, and lean protein.  Do not eat a lot of foods high in solid fats, added sugars, or salt.  Maintain a Healthy Weight Regular exercise can help you achieve or maintain a healthy weight. You should:  Do at least 150 minutes of exercise each week. The exercise should increase your heart rate and make you sweat (moderate-intensity exercise).  Do strength-training exercises at least twice a week.  Watch Your Levels of Cholesterol and Blood Lipids  Have your blood tested for lipids and cholesterol every 5 years starting at 63 years of age. If you are at high risk for heart disease, you should start having your blood tested when you are 63 years old. You may need to have your cholesterol levels checked more often if: ? Your lipid or cholesterol levels are high. ? You are older than 63 years of age. ? You are at high risk for heart disease.  What should I know about cancer screening? Many types of cancers can be detected early and may often be prevented. Lung Cancer  You should be screened every year for lung cancer if: ? You are a current smoker who has smoked for at least 30 years. ? You are a former smoker who has quit within the past 15 years.  Talk to your health care provider about your screening options, when you should start screening, and how often you should be screened.  Colorectal Cancer  Routine colorectal cancer screening usually begins at 63 years of age and should be repeated every 5-10 years until you are 63 years old. You may need to be screened more often if early forms of precancerous polyps or small growths are found.  Your health care provider may recommend screening at an earlier age if you have risk factors for colon cancer.  Your health care provider may recommend using home test kits to check for hidden blood in the stool.  A small camera at the end of a tube can be used to examine your colon (sigmoidoscopy or colonoscopy). This checks for the earliest forms of colorectal cancer.  Prostate and Testicular Cancer  Depending on your age and overall health, your health care provider may do certain tests to screen for prostate and testicular cancer.  Talk to your health care provider about any symptoms or concerns you have about testicular or prostate cancer.  Skin Cancer  Check your skin from head to toe regularly.  Tell your health care provider about any new moles or changes in moles, especially if: ? There is a change in a mole's size, shape, or color. ? You have a mole that is larger than a pencil eraser.  Always use sunscreen. Apply sunscreen liberally and repeat throughout the day.  Protect yourself by wearing long sleeves, pants, a wide-brimmed hat, and sunglasses when outside.  What should I know about heart disease, diabetes, and high blood pressure?  If you are 18-55 years of age, have your blood pressure checked every 3-5 years. If you are 56 years of age or older, have your blood pressure checked every year. You should have your blood pressure measured twice-once when you are at a hospital or clinic,  and once when you are not at a hospital or clinic. Record the average of the two measurements. To check your blood pressure when you are not at a hospital or clinic, you can use: ? An automated blood pressure machine at a pharmacy. ? A home blood pressure monitor.  Talk to your health care provider about your target blood pressure.  If you are between 80-92 years old, ask your health care provider if you should take aspirin to prevent heart disease.  Have regular diabetes screenings by  checking your fasting blood sugar level. ? If you are at a normal weight and have a low risk for diabetes, have this test once every three years after the age of 76. ? If you are overweight and have a high risk for diabetes, consider being tested at a younger age or more often.  A one-time screening for abdominal aortic aneurysm (AAA) by ultrasound is recommended for men aged 40-75 years who are current or former smokers. What should I know about preventing infection? Hepatitis B If you have a higher risk for hepatitis B, you should be screened for this virus. Talk with your health care provider to find out if you are at risk for hepatitis B infection. Hepatitis C Blood testing is recommended for:  Everyone born from [birth year] through 1967.  Anyone with known risk factors for hepatitis C.  Sexually Transmitted Diseases (STDs)  You should be screened each year for STDs including gonorrhea and chlamydia if: ? You are sexually active and are younger than 63 years of age. ? You are older than 63 years of age and your health care provider tells you that you are at risk for this type of infection. ? Your sexual activity has changed since you were last screened and you are at an increased risk for chlamydia or gonorrhea. Ask your health care provider if you are at risk.  Talk with your health care provider about whether you are at high risk of being infected with HIV. Your health care provider may recommend a prescription medicine to help prevent HIV infection.  What else can I do?  Schedule regular health, dental, and eye exams.  Stay current with your vaccines (immunizations).  Do not use any tobacco products, such as cigarettes, chewing tobacco, and e-cigarettes. If you need help quitting, ask your health care provider.  Limit alcohol intake to no more than 2 drinks per day. One drink equals 12 ounces of beer, 5 ounces of wine, or 1 ounces of hard liquor.  Do not use street drugs.  Do not  share needles.  Ask your health care provider for help if you need support or information about quitting drugs.  Tell your health care provider if you often feel depressed.  Tell your health care provider if you have ever been abused or do not feel safe at home. This information is not intended to replace advice given to you by your health care provider. Make sure you discuss any questions you have with your health care provider. Document Released: 05/13/2008 Document Revised: 07/14/2016 Document Reviewed: 08/19/2015 Elsevier Interactive Patient Education  [employer].

## 2017-06-08 LAB — HEMOGLOBIN A1C
Est. average glucose Bld gHb Est-mCnc: 126 mg/dL
Hgb A1c MFr Bld: 6 % — ABNORMAL HIGH (ref 4.8–5.6)

## 2017-06-09 ENCOUNTER — Inpatient Hospital Stay: Payer: BLUE CROSS/BLUE SHIELD

## 2017-06-10 ENCOUNTER — Telehealth: Payer: Self-pay

## 2017-06-10 NOTE — Telephone Encounter (Signed)
-----   Message from Trinna Post, [place] sent at 06/09/2017  3:57 PM EDT ----- A1C shows prediabetes. Please continue with positive changes in diet and lifestyle. We can recheck this at appointment six months from now.

## 2017-06-10 NOTE — Telephone Encounter (Signed)
Advised patient of results.  

## 2017-06-10 NOTE — Telephone Encounter (Signed)
Left message to call back  

## 2017-06-27 ENCOUNTER — Ambulatory Visit (INDEPENDENT_AMBULATORY_CARE_PROVIDER_SITE_OTHER): Payer: BLUE CROSS/BLUE SHIELD | Admitting: General Surgery

## 2017-06-27 ENCOUNTER — Encounter: Payer: Self-pay | Admitting: General Surgery

## 2017-06-27 VITALS — BP 136/78 | HR 82 | Resp 16 | Ht 73.0 in | Wt 284.0 lb

## 2017-06-27 DIAGNOSIS — Z1211 Encounter for screening for malignant neoplasm of colon: Secondary | ICD-10-CM | POA: Diagnosis not present

## 2017-06-27 MED ORDER — POLYETHYLENE GLYCOL 3350 17 GM/SCOOP PO POWD: ORAL | 0 refills | Status: DC

## 2017-06-27 NOTE — Progress Notes (Signed)
Patient ID: Jason Bolton, male   DOB: 1956-02-19, 61 y.o.   MRN: [id number]  Chief Complaint  Patient presents with  . Colonoscopy    HPI Jason Bolton is a 61 y.o. male is here today for an evaluation for a colonoscopy. His last one was done in 2005. Patient states he is doing well, denies gastrointestinal issues. Moves bowels regularly. Denies family history of colon cancer. Denies heartburn or gastric reflux. 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  HPI  Past Medical History:  Diagnosis Date  . Allergy   . Hypercholesterolemia   . Hypertension   . 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 Polycythemia   . Sleep apnea     Past Surgical History:  Procedure Laterality Date  . CATARACT EXTRACTION Right 11/2013  . COLONOSCOPY  2006  . INGUINAL HERNIA REPAIR Right 1972  . MANDIBLE SURGERY  2005   [facility]    Family History  Problem Relation Age of Onset  . Diabetes Mother   . Congestive Heart Failure Mother   . Pancreatic cancer Father   . Healthy Sister   . Healthy Brother   . Healthy Brother   . Healthy Brother     Social History Social History  Substance Use Topics  . Smoking status: Former Smoker    Packs/day: 1.50    Years: 20.00    Types: Cigarettes    Quit date: 11/29/2015  . Smokeless tobacco: Never Used     Comment: quit June of 2008 after Chantix. he was exposed to secondary smoke  . Alcohol use 0.0 oz/week     Comment: occasional    Allergies  Allergen Reactions  . Penicillins Other (See Comments)    unknown    Current Outpatient Prescriptions  Medication Sig Dispense Refill  . aspirin 81 MG tablet Take 81 mg by mouth daily.    . Coenzyme Q10 50 MG CAPS CO ENZYME Q-10, 50MG  (Oral Capsule)  1 po qd for 0 days  Quantity: 30.00;  Refills: 0   Ordered :31-Aug-2010  Margarita Rana MD;  Started 31-Mar-2009 Active Comments: DX: 272.0    . lisinopril-hydrochlorothiazide (ZESTORETIC) 20-12.5 MG tablet Take 1  tablet by mouth daily. 90 tablet 1  . metoprolol (LOPRESSOR) 50 MG tablet Take 1 tablet (50 mg total) by mouth 2 (two) times daily. 180 tablet 3  . OMEGA-3 FATTY ACIDS PO FISH-EPA, 1000MG  (Oral Capsule)  2 po bid for 0 days  Quantity: 0.00;  Refills: 0   Ordered :31-Aug-2010  Margarita Rana MD;  Started 19-June-2007 Active Comments: DX: 272.0    . sildenafil (VIAGRA) 50 MG tablet Take 1 tablet (50 mg total) by mouth as needed for erectile dysfunction. Reported on 03/15/2016 8 tablet 3  . simvastatin (ZOCOR) 20 MG tablet Take 1 tablet (20 mg total) by mouth at bedtime. 90 tablet 3  . tamsulosin (FLOMAX) 0.4 MG CAPS capsule Take 2 capsules (0.8 mg total) by mouth at bedtime. 180 capsule 1  . polyethylene glycol powder (GLYCOLAX/MIRALAX) powder 255 grams one bottle for colonoscopy prep 255 g 0   No current facility-administered medications for this visit.     Review of Systems Review of Systems  Constitutional: Negative.   Respiratory: Negative.   Cardiovascular: Negative.   Gastrointestinal: Negative.     Blood pressure 136/78, pulse 82, resp. rate 16, height 6\' 1"  (1.854 m), weight 284 lb (128.8 kg).  Physical Exam Physical Exam  Constitutional:  He is oriented to person, place, and time. He appears well-developed and well-nourished.  Eyes: Conjunctivae are normal. No scleral icterus.  Neck: Neck supple. No thyromegaly present.  Cardiovascular: Normal rate, regular rhythm and normal heart sounds.   Pulmonary/Chest: Effort normal and breath sounds normal.  Abdominal: Soft. Bowel sounds are normal. There is no tenderness.  Neurological: He is alert and oriented to person, place, and time.  Skin: Skin is warm and dry.    Data Reviewed 07/23/2004 colonoscopy showed a 6 mm polyp in the sigmoid colon. Pathology showed this to be a hyperplastic polyp.  Assessment    Candidate for screening colonoscopy    Plan         Bring CPAP on day of procedure.  Colonoscopy with possible  biopsy/polypectomy prn: Information regarding the procedure, including its potential risks and complications (including but not limited to perforation of the bowel, which may require emergency surgery to repair, and bleeding) was verbally given to the patient. Educational information regarding lower intestinal endoscopy was given to the patient. Written instructions for how to complete the bowel prep using Miralax were provided. The importance of drinking ample fluids to avoid dehydration as a result of the prep emphasized.   HPI, Physical Exam, Assessment and Plan have been scribed under the direction and in the presence of Hervey Ard, MD.  Verlene Mayer, CMA  I have completed the exam and reviewed the above documentation for accuracy and completeness.  I agree with the above.  Haematologist has been used and any errors in dictation or transcription are unintentional.  Hervey Ard, M.D., F.A.C.S.    Jason Bolton 06/28/2017, 6:48 AM   Patient has been scheduled for a colonoscopy on 08-10-17 at [facility]. It is okay for patient to continue an 81 mg aspirin once daily. Patient will only take his blood pressure pill the morning of procedure with a small sip of water. Miralax prescription has been sent in to the patient's pharmacy today. Colonoscopy instructions have been reviewed with the patient. This patient is aware to call the office if they have further questions.   [place], CMA

## 2017-06-27 NOTE — Patient Instructions (Signed)
Bring CPAP on day of procedure   Colonoscopy, Adult A colonoscopy is an exam to look at the entire large intestine. During the exam, a lubricated, bendable tube is inserted into the anus and then passed into the rectum, colon, and other parts of the large intestine. A colonoscopy is often done as a part of normal colorectal screening or in response to certain symptoms, such as anemia, persistent diarrhea, abdominal pain, and blood in the stool. The exam can help screen for and diagnose medical problems, including:  Tumors.  Polyps.  Inflammation.  Areas of bleeding.  Tell a health care provider about:  Any allergies you have.  All medicines you are taking, including vitamins, herbs, eye drops, creams, and over-the-counter medicines.  Any problems you or family members have had with anesthetic medicines.  Any blood disorders you have.  Any surgeries you have had.  Any medical conditions you have.  Any problems you have had passing stool. What are the risks? Generally, this is a safe procedure. However, problems may occur, including:  Bleeding.  A tear in the intestine.  A reaction to medicines given during the exam.  Infection (rare).  What happens before the procedure? Eating and drinking restrictions Follow instructions from your health care provider about eating and drinking, which may include:  A few days before the procedure - follow a low-fiber diet. Avoid nuts, seeds, dried fruit, raw fruits, and vegetables.  1-3 days before the procedure - follow a clear liquid diet. Drink only clear liquids, such as clear broth or bouillon, black coffee or tea, clear juice, clear soft drinks or sports drinks, gelatin dessert, and popsicles. Avoid any liquids that contain red or purple dye.  On the day of the procedure - do not eat or drink anything during the 2 hours before the procedure, or within the time period that your health care provider recommends.  Bowel prep If  you were prescribed an oral bowel prep to clean out your colon:  Take it as told by your health care provider. Starting the day before your procedure, you will need to drink a large amount of medicated liquid. The liquid will cause you to have multiple loose stools until your stool is almost clear or light green.  If your skin or anus gets irritated from diarrhea, you may use these to relieve the irritation: ? Medicated wipes, such as adult wet wipes with aloe and vitamin E. ? A skin soothing-product like petroleum jelly.  If you vomit while drinking the bowel prep, take a break for up to 60 minutes and then begin the bowel prep again. If vomiting continues and you cannot take the bowel prep without vomiting, call your health care provider.  General instructions  Ask your health care provider about changing or stopping your regular medicines. This is especially important if you are taking diabetes medicines or blood thinners.  Plan to have someone take you home from the hospital or clinic. What happens during the procedure?  An IV tube may be inserted into one of your veins.  You will be given medicine to help you relax (sedative).  To reduce your risk of infection: ? Your health care team will wash or sanitize their hands. ? Your anal area will be washed with soap.  You will be asked to lie on your side with your knees bent.  Your health care provider will lubricate a long, thin, flexible tube. The tube will have a camera and a light on the end.  The tube will be inserted into your anus.  The tube will be gently eased through your rectum and colon.  Air will be delivered into your colon to keep it open. You may feel some pressure or cramping.  The camera will be used to take images during the procedure.  A small tissue sample may be removed from your body to be examined under a microscope (biopsy). If any potential problems are found, the tissue will be sent to a lab for  testing.  If small polyps are found, your health care provider may remove them and have them checked for cancer cells.  The tube that was inserted into your anus will be slowly removed. The procedure may vary among health care providers and hospitals. What happens after the procedure?  Your blood pressure, heart rate, breathing rate, and blood oxygen level will be monitored until the medicines you were given have worn off.  Do not drive for 24 hours after the exam.  You may have a small amount of blood in your stool.  You may pass gas and have mild abdominal cramping or bloating due to the air that was used to inflate your colon during the exam.  It is up to you to get the results of your procedure. Ask your health care provider, or the department performing the procedure, when your results will be ready. This information is not intended to replace advice given to you by your health care provider. Make sure you discuss any questions you have with your health care provider. Document Released: 11/12/2000 Document Revised: 09/15/2016 Document Reviewed: 01/27/2016 Elsevier Interactive Patient Education  2018 [employer].

## 2017-06-28 DIAGNOSIS — Z1211 Encounter for screening for malignant neoplasm of colon: Secondary | ICD-10-CM | POA: Insufficient documentation

## 2017-07-07 ENCOUNTER — Inpatient Hospital Stay: Payer: BLUE CROSS/BLUE SHIELD | Attending: Hematology and Oncology

## 2017-07-07 ENCOUNTER — Inpatient Hospital Stay: Payer: BLUE CROSS/BLUE SHIELD

## 2017-07-07 DIAGNOSIS — D751 Secondary polycythemia: Secondary | ICD-10-CM

## 2017-07-07 LAB — HEMATOCRIT: HCT: 49.6 % (ref 40.0–52.0)

## 2017-07-30 ENCOUNTER — Other Ambulatory Visit: Payer: Self-pay | Admitting: Physician Assistant

## 2017-07-30 DIAGNOSIS — I10 Essential (primary) hypertension: Secondary | ICD-10-CM

## 2017-08-03 ENCOUNTER — Encounter: Payer: Self-pay | Admitting: *Deleted

## 2017-08-04 ENCOUNTER — Inpatient Hospital Stay: Payer: BLUE CROSS/BLUE SHIELD | Attending: Hematology and Oncology

## 2017-08-04 ENCOUNTER — Inpatient Hospital Stay: Payer: BLUE CROSS/BLUE SHIELD

## 2017-08-04 DIAGNOSIS — D751 Secondary polycythemia: Secondary | ICD-10-CM | POA: Insufficient documentation

## 2017-08-04 LAB — HEMATOCRIT: HCT: 47.3 % (ref 40.0–52.0)

## 2017-08-08 ENCOUNTER — Telehealth: Payer: Self-pay | Admitting: *Deleted
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
## 2017-08-09 NOTE — Telephone Encounter (Signed)
Does he want this gone

## 2017-08-09 NOTE — Telephone Encounter (Signed)
Disregard last note. Does he want this sent to local pharmacy or mail order?

## 2017-08-10 ENCOUNTER — Encounter: Admission: RE | Payer: Self-pay | Source: Ambulatory Visit

## 2017-08-10 ENCOUNTER — Ambulatory Visit
Admission: RE | Admit: 2017-08-10 | Payer: BLUE CROSS/BLUE SHIELD | Source: Ambulatory Visit | Admitting: General Surgery

## 2017-08-10 SURGERY — COLONOSCOPY WITH PROPOFOL
Anesthesia: General

## 2017-08-11 MED ORDER — LISINOPRIL-HYDROCHLOROTHIAZIDE 20-12.5 MG PO TABS: 1.0000 | ORAL_TABLET | Freq: Every day | ORAL | 3 refills | Status: DC

## 2017-08-11 NOTE — Telephone Encounter (Signed)
Sent in to mail order, just FYI. Thanks.

## 2017-08-11 NOTE — Telephone Encounter (Signed)
Pt would like his refill to go to mail order.  Thanks,   -Mickel Baas

## 2017-08-18 ENCOUNTER — Other Ambulatory Visit: Payer: Self-pay | Admitting: Physician Assistant

## 2017-08-18 NOTE — Telephone Encounter (Signed)
[facility] faxed a refill request on the following medications:  lisinopril-hydrochlorothiazide (ZESTORETIC) 20-12.5 MG tablet.  Take 1 tablet by mouth every day.  30 day supply  [facility].

## 2017-08-18 NOTE — Telephone Encounter (Signed)
This was filled to mail order per patient's request earlier this Sharlett Iles, RMA

## 2017-09-01 ENCOUNTER — Inpatient Hospital Stay: Payer: BLUE CROSS/BLUE SHIELD

## 2017-09-05 ENCOUNTER — Inpatient Hospital Stay: Payer: BLUE CROSS/BLUE SHIELD

## 2017-09-05 ENCOUNTER — Inpatient Hospital Stay: Payer: BLUE CROSS/BLUE SHIELD | Attending: Hematology and Oncology

## 2017-09-05 VITALS — BP 122/83 | HR 59 | Resp 20

## 2017-09-05 DIAGNOSIS — D751 Secondary polycythemia: Secondary | ICD-10-CM

## 2017-09-05 DIAGNOSIS — Z23 Encounter for immunization: Secondary | ICD-10-CM | POA: Diagnosis not present

## 2017-09-05 LAB — HEMATOCRIT: HCT: 49.7 % (ref 40.0–52.0)

## 2017-09-05 MED ORDER — INFLUENZA VAC SPLIT QUAD 0.5 ML IM SUSY
0.5000 mL | PREFILLED_SYRINGE | Freq: Once | INTRAMUSCULAR | Status: AC
  Administered 2017-09-05: 0.5 mL via INTRAMUSCULAR

## 2017-10-12 ENCOUNTER — Encounter: Payer: Self-pay | Admitting: *Deleted

## 2017-10-17 ENCOUNTER — Inpatient Hospital Stay: Payer: BLUE CROSS/BLUE SHIELD

## 2017-10-17 ENCOUNTER — Inpatient Hospital Stay: Payer: BLUE CROSS/BLUE SHIELD | Attending: Hematology and Oncology | Admitting: Hematology and Oncology

## 2017-10-17 ENCOUNTER — Other Ambulatory Visit: Payer: Self-pay | Admitting: Hematology and Oncology

## 2017-10-17 ENCOUNTER — Other Ambulatory Visit: Payer: Self-pay | Admitting: *Deleted

## 2017-10-17 VITALS — BP 149/92 | HR 82 | Temp 98.2°F | Resp 20 | Wt 287.5 lb

## 2017-10-17 DIAGNOSIS — M869 Osteomyelitis, unspecified: Secondary | ICD-10-CM | POA: Insufficient documentation

## 2017-10-17 DIAGNOSIS — D751 Secondary polycythemia: Secondary | ICD-10-CM | POA: Diagnosis not present

## 2017-10-17 DIAGNOSIS — G4730 Sleep apnea, unspecified: Secondary | ICD-10-CM | POA: Insufficient documentation

## 2017-10-17 DIAGNOSIS — Z7982 Long term (current) use of aspirin: Secondary | ICD-10-CM | POA: Insufficient documentation

## 2017-10-17 DIAGNOSIS — I10 Essential (primary) hypertension: Secondary | ICD-10-CM | POA: Insufficient documentation

## 2017-10-17 DIAGNOSIS — G4733 Obstructive sleep apnea (adult) (pediatric): Secondary | ICD-10-CM | POA: Diagnosis not present

## 2017-10-17 DIAGNOSIS — F17210 Nicotine dependence, cigarettes, uncomplicated: Secondary | ICD-10-CM

## 2017-10-17 DIAGNOSIS — Z79899 Other long term (current) drug therapy: Secondary | ICD-10-CM

## 2017-10-17 DIAGNOSIS — E7800 Pure hypercholesterolemia, unspecified: Secondary | ICD-10-CM

## 2017-10-17 DIAGNOSIS — Z800 Family history of malignant neoplasm of digestive organs: Secondary | ICD-10-CM | POA: Diagnosis not present

## 2017-10-17 LAB — CBC WITH DIFFERENTIAL/PLATELET
Basophils Absolute: 0.1 10*3/uL (ref 0–0.1)
Basophils Relative: 1 %
Eosinophils Absolute: 0.2 10*3/uL (ref 0–0.7)
Eosinophils Relative: 2 %
HCT: 47.4 % (ref 40.0–52.0)
Hemoglobin: 15.3 g/dL (ref 13.0–18.0)
Lymphocytes Relative: 19 %
Lymphs Abs: 1.9 10*3/uL (ref 1.0–3.6)
MCH: 25 pg — ABNORMAL LOW (ref 26.0–34.0)
MCHC: 32.2 g/dL (ref 32.0–36.0)
MCV: 77.6 fL — ABNORMAL LOW (ref 80.0–100.0)
Monocytes Absolute: 0.9 10*3/uL (ref 0.2–1.0)
Monocytes Relative: 9 %
Neutro Abs: 6.9 10*3/uL — ABNORMAL HIGH (ref 1.4–6.5)
Neutrophils Relative %: 69 %
Platelets: 125 10*3/uL — ABNORMAL LOW (ref 150–440)
RBC: 6.11 MIL/uL — ABNORMAL HIGH (ref 4.40–5.90)
RDW: 17.8 % — ABNORMAL HIGH (ref 11.5–14.5)
WBC: 9.9 10*3/uL (ref 3.8–10.6)

## 2017-10-17 LAB — FERRITIN: Ferritin: 5 ng/mL — ABNORMAL LOW (ref 24–336)

## 2017-10-17 NOTE — Progress Notes (Signed)
Pt in today for follow up, lab, MD.  BP elevated 149/92 reports took blood pressure meds today.

## 2017-10-17 NOTE — Progress Notes (Signed)
[facility] day:  10/17/2017  Chief Complaint: Jason Bolton is a 61 y.o. 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  HPI:  The patient was last seen in the hematology clinic on 04/14/2017.  At that time, he was seen for initial assessment by me.  He undergoes phlebotomy to keep his hematocrit < 48.  Last phlebotomy was on 09/05/2017.  Hematocrit was 48.1 on 04/14/2017, 50 on 05/12/2017, 49.6 on 07/07/2017, 47.3 on 08/04/2017, and 49.7 on 09/05/2017.  Symptomatically, patient is feeling "better". Patient recently diagnosed with OSAH syndrome. He uses CPAP therapy at night. Patient notes that he is better rested and that is not experiencing excessive daytime sleepiness. Patient denies headaches. Patient denies chest pain and shortness of breath. Patient denies bleeding; no hematochezia, melena, or gross hematuria. He is schedule for a routine follow up colonoscopy on 10/19/2017.  Patient continues to eat well, with no demonstrated weight loss.   Patient is smoking 8-10 cigarettes again at this point. He denies testosterone supplementation.    Past Medical History:  Diagnosis Date  . Allergy   . Hypercholesterolemia   . Hypertension   . 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 Polycythemia   . Sleep apnea     Past Surgical History:  Procedure Laterality Date  . CATARACT EXTRACTION Right 11/2013  . COLONOSCOPY  2006  . INGUINAL HERNIA REPAIR Right 1972  . MANDIBLE SURGERY  2005   [facility]    Family History  Problem Relation Age of Onset  . Diabetes Mother   . Congestive Heart Failure Mother   . Pancreatic cancer Father   . Healthy Sister   . Healthy Brother   . Healthy Brother   . Healthy Brother     Social History:  reports that he quit smoking about 22 months ago. His smoking use included cigarettes. He has a 30.00 pack-year smoking history. he has never used smokeless  tobacco. He reports that he drinks alcohol. His drug history is not on file.  He stopped smoking 2 years ago.  Patient is smoking 8-10 cigarettes again at this point.  He has not had any exposure to formaldehyde in 20 years.  He runs 2 funeral homes.  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  Allergies:  Allergies  Allergen Reactions  . Penicillins Other (See Comments)    unknown    Current Medications: Current Outpatient Medications  Medication Sig Dispense Refill  . aspirin 81 MG tablet Take 81 mg by mouth daily.    . Coenzyme Q10 50 MG CAPS CO ENZYME Q-10, 50MG  (Oral Capsule)  1 po qd for 0 days  Quantity: 30.00;  Refills: 0   Ordered :31-Aug-2010  Margarita Rana MD;  Started 31-Mar-2009 Active Comments: DX: 272.0    . lisinopril-hydrochlorothiazide (ZESTORETIC) 20-12.5 MG tablet Take 1 tablet by mouth daily. 90 tablet 3  . metoprolol (LOPRESSOR) 50 MG tablet Take 1 tablet (50 mg total) by mouth 2 (two) times daily. 180 tablet 3  . OMEGA-3 FATTY ACIDS PO FISH-EPA, 1000MG  (Oral Capsule)  2 po bid for 0 days  Quantity: 0.00;  Refills: 0   Ordered :31-Aug-2010  Margarita Rana MD;  Started 19-June-2007 Active Comments: DX: 272.0    . polyethylene glycol powder (GLYCOLAX/MIRALAX) powder 255 grams one bottle for colonoscopy prep 255 g 0  . sildenafil (VIAGRA) 50 MG tablet Take 1 tablet (50 mg total) by mouth as  needed for erectile dysfunction. Reported on 03/15/2016 8 tablet 3  . simvastatin (ZOCOR) 20 MG tablet Take 1 tablet (20 mg total) by mouth at bedtime. 90 tablet 3   No current facility-administered medications for this visit.     Review of Systems:  GENERAL:  Feels "better".  No fevers or sweats. Weight up 3 pounds.  PERFORMANCE STATUS (ECOG):  1 HEENT:  No visual changes, runny nose, sore throat, mouth sores or tenderness. Lungs: No shortness of breath or cough.  No hemoptysis.  Using CPAP. Cardiac:  No chest pain, palpitations, orthopnea, or PND. GI:  No nausea, vomiting, diarrhea, constipation,  melena or hematochezia.  Last colonoscopy 10 years ago. GU:  No urgency, frequency, dysuria, or hematuria. Musculoskeletal:  No back pain.  No joint pain.  No muscle tenderness. Extremities:  No pain or swelling. Skin:  No rashes or skin changes. Neuro:  Dull headache with increased hematocrit.  No numbness or weakness, balance or coordination issues. Endocrine:  No diabetes, thyroid issues, hot flashes or night sweats. Psych:  No mood changes, depression or anxiety. Pain:  No focal pain. Review of systems:  All other systems reviewed and found to be negative.  Physical Exam: Blood pressure (!) 149/92, pulse 82, temperature 98.2 F (36.8 C), temperature source Tympanic, resp. rate 20, weight 287 lb 8 oz (130.4 kg), SpO2 93 %. GENERAL:  Well developed, well nourished, heavyset sitting comfortably in the exam room in no acute distress. MENTAL STATUS:  Alert and oriented to person, place and time. HEAD:  Gray spiked hair.   Mustache.  Normocephalic, atraumatic, face symmetric, no Cushingoid features. EYES:  Blue eyes.  Pharynx clear without lesion.  Tongue normal. Mucous membranes moist.  RESPIRATORY:  Clear to auscultation without rales, wheezes or rhonchi. CARDIOVASCULAR:  Regular rate and rhythm without murmur, rub or gallop. ABDOMEN:  Soft, non-tender, with active bowel sounds, and no hepatosplenomegaly.  No masses. SKIN:  No rashes, ulcers or lesions. EXTREMITIES: No edema, no skin discoloration or tenderness.  No palpable cords. LYMPH NODES: No palpable cervical, supraclavicular, axillary or inguinal adenopathy  NEUROLOGICAL: Unremarkable. PSYCH:  Appropriate.   Appointment on 10/17/2017  Component Date Value Ref Range Status  . WBC 10/17/2017 9.9  3.8 - 10.6 K/uL Final  . RBC 10/17/2017 6.11* 4.40 - 5.90 MIL/uL Final  . Hemoglobin 10/17/2017 15.3  13.0 - 18.0 g/dL Final  . HCT 10/17/2017 47.4  40.0 - 52.0 % Final  . MCV 10/17/2017 77.6* 80.0 - 100.0 fL Final  . MCH 10/17/2017  25.0* 26.0 - 34.0 pg Final  . MCHC 10/17/2017 32.2  32.0 - 36.0 g/dL Final  . RDW 10/17/2017 17.8* 11.5 - 14.5 % Final  . Platelets 10/17/2017 125* 150 - 440 K/uL Final  . Neutrophils Relative % 10/17/2017 69  % Final  . Neutro Abs 10/17/2017 6.9* 1.4 - 6.5 K/uL Final  . Lymphocytes Relative 10/17/2017 19  % Final  . Lymphs Abs 10/17/2017 1.9  1.0 - 3.6 K/uL Final  . Monocytes Relative 10/17/2017 9  % Final  . Monocytes Absolute 10/17/2017 0.9  0.2 - 1.0 K/uL Final  . Eosinophils Relative 10/17/2017 2  % Final  . Eosinophils Absolute 10/17/2017 0.2  0 - 0.7 K/uL Final  . Basophils Relative 10/17/2017 1  % Final  . Basophils Absolute 10/17/2017 0.1  0 - 0.1 K/uL Final    Assessment:  Jason Bolton is a 62 y.o. 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   Etiology is felt  secondary to sleep apnea.  He uses CPAP.  He has a 30 pack year smoking history.  He stopped smoking in 2016.  He does not use testosterone.  Initial labs ruled out polycythemia rubra vera. JAK2 V617F and exon 12 were negative.  Erythropoietin level was normal.  He is on a baby aspirin.  He undergoes phlebotomy to keep his hematocrit < 48.  Last phlebotomy was on 09/05/2017.    Ferritin has been followed: 6 on 05/12/2017.  Symptomatically, patient is feeling better. He denies physical concerns.  He recently started smoking again.  Exam is stable. Hematocrit 47.4.  Plan: 1.  Discuss unclear persistence of erythrocytosis if secondary to sleep apnea as he is on CPAP with resolution of symptoms.  Discuss repeat work-up. 2.  Labs today: CBC with diff, ferritin, JAK2 with reflex to exon 12, epo level, carbon monoxide level. 3.  Hematocrit 47.4. Goal is < 48.  No therapeutic phlebotomy required today.  4.  Encourage smoking cessation. 5.  RTC monthly for HCT +/- phlebotomy. 6.  RTC in 6 months for MD assessment, labs (CBC with diff, ferritin) +/- phlebotomy.   Honor Loh, NP  10/17/2017, 2:59 PM   I saw and  evaluated the patient, participating in the key portions of the service and reviewing pertinent diagnostic studies and records.  I reviewed the nurse practitioner's note and agree with the findings and the plan.  The assessment and plan were discussed with the patient.  Several questions were asked by the patient and answered.   Nolon Stalls, MD 10/17/2017,2:59 PM

## 2017-10-18 LAB — CARBON MONOXIDE, BLOOD (PERFORMED AT REF LAB): Carbon Monoxide, Blood: 9.1 % — ABNORMAL HIGH (ref 0.0–3.6)

## 2017-10-18 LAB — ERYTHROPOIETIN: Erythropoietin: 25.3 m[IU]/mL — ABNORMAL HIGH (ref 2.6–18.5)

## 2017-10-19 ENCOUNTER — Telehealth: Payer: Self-pay | Admitting: Urgent Care

## 2017-10-19 ENCOUNTER — Other Ambulatory Visit: Payer: Self-pay | Admitting: Urgent Care

## 2017-10-19 ENCOUNTER — Ambulatory Visit
Admission: RE | Admit: 2017-10-19 | Discharge: 2017-10-19 | Disposition: A | Discharge status: Home / Self Care | Payer: BLUE CROSS/BLUE SHIELD | Source: Ambulatory Visit | Attending: General Surgery | Admitting: General Surgery

## 2017-10-19 ENCOUNTER — Encounter
Admission: RE | Disposition: A | Discharge status: Home / Self Care | Payer: Self-pay | Source: Ambulatory Visit | Attending: General Surgery

## 2017-10-19 ENCOUNTER — Encounter: Payer: Self-pay | Admitting: *Deleted

## 2017-10-19 ENCOUNTER — Ambulatory Visit: Payer: BLUE CROSS/BLUE SHIELD | Admitting: Anesthesiology

## 2017-10-19 DIAGNOSIS — Z79899 Other long term (current) drug therapy: Secondary | ICD-10-CM | POA: Diagnosis not present

## 2017-10-19 DIAGNOSIS — D122 Benign neoplasm of ascending colon: Secondary | ICD-10-CM | POA: Insufficient documentation

## 2017-10-19 DIAGNOSIS — K5730 Diverticulosis of large intestine without perforation or abscess without bleeding: Secondary | ICD-10-CM | POA: Insufficient documentation

## 2017-10-19 DIAGNOSIS — D751 Secondary polycythemia: Secondary | ICD-10-CM

## 2017-10-19 DIAGNOSIS — Z87891 Personal history of nicotine dependence: Secondary | ICD-10-CM | POA: Diagnosis not present

## 2017-10-19 DIAGNOSIS — E7800 Pure hypercholesterolemia, unspecified: Secondary | ICD-10-CM | POA: Insufficient documentation

## 2017-10-19 DIAGNOSIS — Z880 Allergy status to penicillin: Secondary | ICD-10-CM | POA: Diagnosis not present

## 2017-10-19 DIAGNOSIS — I10 Essential (primary) hypertension: Secondary | ICD-10-CM | POA: Diagnosis not present

## 2017-10-19 DIAGNOSIS — J449 Chronic obstructive pulmonary disease, unspecified: Secondary | ICD-10-CM | POA: Insufficient documentation

## 2017-10-19 DIAGNOSIS — Z7982 Long term (current) use of aspirin: Secondary | ICD-10-CM | POA: Diagnosis not present

## 2017-10-19 DIAGNOSIS — Z9989 Dependence on other enabling machines and devices: Secondary | ICD-10-CM | POA: Diagnosis not present

## 2017-10-19 DIAGNOSIS — Z6837 Body mass index (BMI) 37.0-37.9, adult: Secondary | ICD-10-CM | POA: Insufficient documentation

## 2017-10-19 DIAGNOSIS — Z1211 Encounter for screening for malignant neoplasm of colon: Secondary | ICD-10-CM | POA: Insufficient documentation

## 2017-10-19 DIAGNOSIS — G4730 Sleep apnea, unspecified: Secondary | ICD-10-CM | POA: Insufficient documentation

## 2017-10-19 DIAGNOSIS — K635 Polyp of colon: Secondary | ICD-10-CM | POA: Diagnosis not present

## 2017-10-19 DIAGNOSIS — K5790 Diverticulosis of intestine, part unspecified, without perforation or abscess without bleeding: Secondary | ICD-10-CM | POA: Diagnosis not present

## 2017-10-19 HISTORY — PX: COLONOSCOPY WITH PROPOFOL: SHX5780

## 2017-10-19 SURGERY — COLONOSCOPY WITH PROPOFOL
Anesthesia: General

## 2017-10-19 MED ORDER — PROPOFOL 10 MG/ML IV BOLUS
INTRAVENOUS | Status: DC | PRN
  Administered 2017-10-19: 30 mg via INTRAVENOUS

## 2017-10-19 MED ORDER — PROPOFOL 500 MG/50ML IV EMUL
INTRAVENOUS | Status: AC
  Filled 2017-10-19: qty 50

## 2017-10-19 MED ORDER — MIDAZOLAM HCL 2 MG/2ML IJ SOLN
INTRAMUSCULAR | Status: DC | PRN
  Administered 2017-10-19 (×2): 1 mg via INTRAVENOUS

## 2017-10-19 MED ORDER — FENTANYL CITRATE (PF) 100 MCG/2ML IJ SOLN
INTRAMUSCULAR | Status: AC
  Filled 2017-10-19: qty 2

## 2017-10-19 MED ORDER — FENTANYL CITRATE (PF) 100 MCG/2ML IJ SOLN
INTRAMUSCULAR | Status: DC | PRN
  Administered 2017-10-19 (×2): 50 ug via INTRAVENOUS

## 2017-10-19 MED ORDER — MIDAZOLAM HCL 2 MG/2ML IJ SOLN
INTRAMUSCULAR | Status: AC
  Filled 2017-10-19: qty 2

## 2017-10-19 MED ORDER — PROPOFOL 500 MG/50ML IV EMUL
INTRAVENOUS | Status: DC | PRN
  Administered 2017-10-19: 120 ug/kg/min via INTRAVENOUS

## 2017-10-19 MED ORDER — SODIUM CHLORIDE 0.9 % IV SOLN
INTRAVENOUS | Status: DC
  Administered 2017-10-19: 07:00:00 via INTRAVENOUS

## 2017-10-19 NOTE — Telephone Encounter (Signed)
Call made to patient to discuss need for further workup after EPO result returned elevated. EPO level 25.3. Discussed differentials with patient. He was made aware that Dr. Mike Gip would like to do some imaging of his abdomen and pelvis to assess for underlying pathological process that could be causing the elevated EPO level, and resulting polycythemia. Patient verbalized understanding and consent. Scheduling to call patient with appointment for the CT. Orders have been entered.

## 2017-10-19 NOTE — Transfer of Care (Signed)
Immediate Anesthesia Transfer of Care Note  Patient: Jason Bolton  Procedure(s) Performed: COLONOSCOPY WITH PROPOFOL (N/A )  Patient Location: PACU  Anesthesia Type:General  Level of Consciousness: awake  Airway & Oxygen Therapy: Patient Spontanous Breathing and Patient connected to nasal cannula oxygen  Post-op Assessment: Report given to RN and Post -op Vital signs reviewed and stable  Post vital signs: Reviewed  Last Vitals:  Vitals:   10/19/17 0651  BP: 134/78  Pulse: 92  Resp: 18  Temp: 37.4 C  SpO2: 94%    Last Pain:  Vitals:   10/19/17 0651  TempSrc: Tympanic         Complications: No apparent anesthesia complications

## 2017-10-19 NOTE — H&P (Signed)
Jason Bolton [id number] 17-Jul-1956     HPI: Healthy 61 year old male for screening colonoscopy.  No change in clinical history since July 2018 exam.  Medications Prior to Admission  Medication Sig Dispense Refill Last Dose  . aspirin 81 MG tablet Take 81 mg by mouth daily.   Past Week at Unknown time  . Coenzyme Q10 50 MG CAPS CO ENZYME Q-10, 50MG  (Oral Capsule)  1 po qd for 0 days  Quantity: 30.00;  Refills: 0   Ordered :31-Aug-2010  Margarita Rana MD;  Started 31-Mar-2009 Active Comments: DX: 272.0   Past Week at Unknown time  . lisinopril-hydrochlorothiazide (ZESTORETIC) 20-12.5 MG tablet Take 1 tablet by mouth daily. 90 tablet 3 10/18/2017 at Unknown time  . metoprolol (LOPRESSOR) 50 MG tablet Take 1 tablet (50 mg total) by mouth 2 (two) times daily. 180 tablet 3 10/19/2017 at Unknown time  . OMEGA-3 FATTY ACIDS PO FISH-EPA, 1000MG  (Oral Capsule)  2 po bid for 0 days  Quantity: 0.00;  Refills: 0   Ordered :31-Aug-2010  Margarita Rana MD;  Started 19-June-2007 Active Comments: DX: 272.0   Past Week at Unknown time  . simvastatin (ZOCOR) 20 MG tablet Take 1 tablet (20 mg total) by mouth at bedtime. 90 tablet 3 10/18/2017 at Unknown time  . polyethylene glycol powder (GLYCOLAX/MIRALAX) powder 255 grams one bottle for colonoscopy prep 255 g 0 Taking  . sildenafil (VIAGRA) 50 MG tablet Take 1 tablet (50 mg total) by mouth as needed for erectile dysfunction. Reported on 03/15/2016 8 tablet 3 Taking   Allergies  Allergen Reactions  . Penicillins Other (See Comments)    unknown   Past Medical History:  Diagnosis Date  . Allergy   . Hypercholesterolemia   . Hypertension   . 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 Polycythemia   . Sleep apnea    Past Surgical History:  Procedure Laterality Date  . CATARACT EXTRACTION Right 11/2013  . COLONOSCOPY  2006  . INGUINAL HERNIA REPAIR Right 1972  . MANDIBLE SURGERY  2005   [facility]   Social History    Socioeconomic History  . Marital status: Married    Spouse name: Not on file  . Number of children: 2  . Years of education: Not on file  . Highest education level: Not on file  Social Needs  . Financial resource strain: Not on file  . Food insecurity - worry: Not on file  . Food insecurity - inability: Not on file  . Transportation needs - medical: Not on file  . Transportation needs - non-medical: Not on file  Occupational History  . Occupation: Librarian, academic  Tobacco Use  . Smoking status: Former Smoker    Packs/day: 1.50    Years: 20.00    Pack years: 30.00    Types: Cigarettes    Last attempt to quit: 11/29/2015    Years since quitting: 1.8  . Smokeless tobacco: Never Used  . Tobacco comment: quit June of 2008 after [employer]. he was exposed to secondary smoke  Substance and Sexual Activity  . Alcohol use: Yes    Alcohol/week: 0.0 oz    Comment: occasional  . Drug use: No  . Sexual activity: Not on file  Other Topics Concern  . Not on file  Social History Narrative  . Not on file   Social History   Social History Narrative  . Not on file     ROS: Negative.  PE: HEENT: Negative. Lungs: Clear. Cardio: RR.  Assessment/Plan:  Proceed with planned endoscopy.  Jason Bolton 10/19/2017

## 2017-10-19 NOTE — Anesthesia Postprocedure Evaluation (Signed)
Anesthesia Post Note  Patient: Jason Bolton  Procedure(s) Performed: COLONOSCOPY WITH PROPOFOL (N/A )  Patient location during evaluation: PACU Anesthesia Type: General Level of consciousness: awake Pain management: pain level controlled Vital Signs Assessment: post-procedure vital signs reviewed and stable Respiratory status: spontaneous breathing Cardiovascular status: stable Anesthetic complications: no     Last Vitals:  Vitals:   10/19/17 0825 10/19/17 0835  BP: 114/79 123/88  Pulse: 71 83  Resp: (!) 22 17  Temp:    SpO2: (!) 89% 91%    Last Pain:  Vitals:   10/19/17 0805  TempSrc: Tympanic                 VAN STAVEREN,Andreya Lacks

## 2017-10-19 NOTE — Anesthesia Preprocedure Evaluation (Signed)
Anesthesia Evaluation  Patient identified by MRN, date of birth, ID band Patient awake    Reviewed: Allergy & Precautions, NPO status , Patient's Chart, lab work & pertinent test results  Airway Mallampati: III       Dental  (+) Teeth Intact   Pulmonary sleep apnea and Continuous Positive Airway Pressure Ventilation , COPD, former smoker,     + decreased breath sounds      Cardiovascular Exercise Tolerance: Good hypertension, Pt. on medications and Pt. on home beta blockers  Rhythm:Regular Rate:Normal     Neuro/Psych negative neurological ROS  negative psych ROS   GI/Hepatic negative GI ROS, Neg liver ROS,   Endo/Other  Morbid obesity  Renal/GU negative Renal ROS     Musculoskeletal negative musculoskeletal ROS (+)   Abdominal (+) + obese,   Peds negative pediatric ROS (+)  Hematology   Anesthesia Other Findings   Reproductive/Obstetrics                             Anesthesia Physical Anesthesia Plan  ASA: III  Anesthesia Plan: General   Post-op Pain Management:    Induction: Intravenous  PONV Risk Score and Plan: 0  Airway Management Planned: Natural Airway and Nasal Cannula  Additional Equipment:   Intra-op Plan:   Post-operative Plan:   Informed Consent: I have reviewed the patients History and Physical, chart, labs and discussed the procedure including the risks, benefits and alternatives for the proposed anesthesia with the patient or authorized representative who has indicated his/her understanding and acceptance.     Plan Discussed with: Surgeon  Anesthesia Plan Comments:         Anesthesia Quick Evaluation

## 2017-10-19 NOTE — Op Note (Signed)
[facility] Gastroenterology Patient Name: Jason Bolton Procedure Date: 10/19/2017 7:28 AM MRN: [id number] Account #: [id number] Date of Birth: Jan 12, 1956 Admit Type: Outpatient Age: 61 Room: [facility] ENDO ROOM 1 Gender: Male Note Status: Finalized Procedure:            Colonoscopy Indications:          Screening for colorectal malignant neoplasm Providers:            [name], MD Referring MD:         Kirstie Peri. Caryn Section, MD (Referring MD) Medicines:            Monitored Anesthesia Care Complications:        No immediate complications. Procedure:            Pre-Anesthesia Assessment:                       - Prior to the procedure, a History and Physical was                        performed, and patient medications, allergies and                        sensitivities were reviewed. The patient's tolerance of                        previous anesthesia was reviewed.                       - The risks and benefits of the procedure and the                        sedation options and risks were discussed with the                        patient. All questions were answered and informed                        consent was obtained.                       After obtaining informed consent, the colonoscope was                        passed under direct vision. Throughout the procedure,                        the patient's blood pressure, pulse, and oxygen                        saturations were monitored continuously. The                        Colonoscope was introduced through the anus and                        advanced to the the cecum, identified by appendiceal                        orifice and ileocecal valve. The colonoscopy was  performed without difficulty. The patient tolerated the                        procedure well. The quality of the bowel preparation                        was good. Findings:      A single medium-mouthed  diverticulum was found in the sigmoid colon.      A 10 mm polyp was found in the mid ascending colon. The polyp was       sessile. The polyp was removed with a cold snare. Resection and       retrieval were complete.      The retroflexed view of the distal rectum and anal verge was normal and       showed no anal or rectal abnormalities. Impression:           - Diverticulosis in the sigmoid colon.                       - One 10 mm polyp in the mid ascending colon, removed                        with a cold snare. Resected and retrieved.                       - The distal rectum and anal verge are normal on                        retroflexion view. Recommendation:       - Telephone endoscopist for pathology results in 1 week. Procedure Code(s):    --- Professional ---                       [phone], Colonoscopy, flexible; with removal of tumor(s),                        polyp(s), or other lesion(s) by snare technique Diagnosis Code(s):    --- Professional ---                       Z12.11, Encounter for screening for malignant neoplasm                        of colon                       K57.30, Diverticulosis of large intestine without                        perforation or abscess without bleeding                       D12.2, Benign neoplasm of ascending colon CPT copyright 2016 American Medical Association. All rights reserved. The codes documented in this report are preliminary and upon coder review may  be revised to meet current compliance requirements. [name], MD 10/19/2017 7:57:12 AM This report has been signed electronically. Number of Addenda: 0 Note Initiated On: 10/19/2017 7:28 AM Scope Withdrawal Time: 0 hours 8 minutes 52 seconds  Total Procedure Duration: 0 hours 16 minutes 11 seconds       [facility]

## 2017-10-19 NOTE — Anesthesia Post-op Follow-up Note (Signed)
Anesthesia QCDR form completed.        

## 2017-10-20 ENCOUNTER — Encounter: Payer: Self-pay | Admitting: Hematology and Oncology

## 2017-10-22 LAB — SURGICAL PATHOLOGY

## 2017-10-23 ENCOUNTER — Encounter: Payer: Self-pay | Admitting: Family Medicine

## 2017-10-23 DIAGNOSIS — Z86010 Personal history of colonic polyps: Secondary | ICD-10-CM | POA: Insufficient documentation

## 2017-10-24 ENCOUNTER — Encounter: Payer: Self-pay | Admitting: General Surgery

## 2017-10-24 ENCOUNTER — Telehealth: Payer: Self-pay | Admitting: *Deleted

## 2017-10-24 NOTE — Telephone Encounter (Signed)
-----   Message from [name], MD sent at 10/24/2017 11:59 AM EST -----  Please notify the patient that the polyp removed at the time of last week's colonoscopy was benign.  I would recommend a repeat exam in 5 years.  Please put him in recalls.  Thank you ----- Message ----- From: Interface, Lab In Three Zero One Sent: 10/22/2017   5:33 PM To: [name], MD

## 2017-10-24 NOTE — Telephone Encounter (Signed)
Notified patient as instructed, patient agrees. Patient is placed in recalls for 5 year repeat colonoscopy.

## 2017-10-27 ENCOUNTER — Ambulatory Visit
Admission: RE | Admit: 2017-10-27 | Discharge: 2017-10-27 | Disposition: A | Discharge status: Home / Self Care | Payer: BLUE CROSS/BLUE SHIELD | Source: Ambulatory Visit | Attending: Urgent Care | Admitting: Urgent Care

## 2017-10-27 DIAGNOSIS — D751 Secondary polycythemia: Secondary | ICD-10-CM | POA: Diagnosis not present

## 2017-10-27 DIAGNOSIS — I700 Atherosclerosis of aorta: Secondary | ICD-10-CM | POA: Diagnosis not present

## 2017-10-27 DIAGNOSIS — N281 Cyst of kidney, acquired: Secondary | ICD-10-CM | POA: Insufficient documentation

## 2017-10-27 DIAGNOSIS — N400 Enlarged prostate without lower urinary tract symptoms: Secondary | ICD-10-CM | POA: Insufficient documentation

## 2017-10-27 DIAGNOSIS — K7689 Other specified diseases of liver: Secondary | ICD-10-CM | POA: Diagnosis not present

## 2017-10-27 LAB — POCT I-STAT CREATININE: CREATININE: 1 mg/dL (ref 0.61–1.24)

## 2017-10-27 MED ORDER — IOPAMIDOL (ISOVUE-370) INJECTION 76%
100.0000 mL | Freq: Once | INTRAVENOUS | Status: AC | PRN
  Administered 2017-10-27: 100 mL via INTRAVENOUS

## 2017-10-28 LAB — JAK2 EXONS 12-15

## 2017-10-28 LAB — JAK2  V617F QUAL. WITH REFLEX TO EXON 12: Reflex:: 15

## 2017-11-16 ENCOUNTER — Inpatient Hospital Stay: Payer: BLUE CROSS/BLUE SHIELD

## 2017-11-16 ENCOUNTER — Inpatient Hospital Stay: Payer: BLUE CROSS/BLUE SHIELD | Attending: Hematology and Oncology

## 2017-11-16 DIAGNOSIS — D751 Secondary polycythemia: Secondary | ICD-10-CM | POA: Insufficient documentation

## 2017-11-16 LAB — CBC WITH DIFFERENTIAL/PLATELET
Basophils Absolute: 0.1 10*3/uL (ref 0–0.1)
Basophils Relative: 1 %
Eosinophils Absolute: 0.2 10*3/uL (ref 0–0.7)
Eosinophils Relative: 3 %
HCT: 51 % (ref 40.0–52.0)
Hemoglobin: 16.1 g/dL (ref 13.0–18.0)
Lymphocytes Relative: 18 %
Lymphs Abs: 1.7 10*3/uL (ref 1.0–3.6)
MCH: 24.8 pg — ABNORMAL LOW (ref 26.0–34.0)
MCHC: 31.6 g/dL — ABNORMAL LOW (ref 32.0–36.0)
MCV: 78.4 fL — ABNORMAL LOW (ref 80.0–100.0)
Monocytes Absolute: 0.9 10*3/uL (ref 0.2–1.0)
Monocytes Relative: 9 %
Neutro Abs: 6.6 10*3/uL — ABNORMAL HIGH (ref 1.4–6.5)
Neutrophils Relative %: 69 %
Platelets: 143 10*3/uL — ABNORMAL LOW (ref 150–440)
RBC: 6.5 MIL/uL — ABNORMAL HIGH (ref 4.40–5.90)
RDW: 19.1 % — ABNORMAL HIGH (ref 11.5–14.5)
WBC: 9.5 10*3/uL (ref 3.8–10.6)

## 2017-11-16 LAB — FERRITIN: Ferritin: 6 ng/mL — ABNORMAL LOW (ref 24–336)

## 2017-11-18 ENCOUNTER — Other Ambulatory Visit: Payer: Self-pay | Admitting: Hematology and Oncology

## 2017-12-01 ENCOUNTER — Other Ambulatory Visit: Payer: Self-pay | Admitting: Physician Assistant

## 2017-12-01 DIAGNOSIS — I10 Essential (primary) hypertension: Secondary | ICD-10-CM

## 2017-12-01 NOTE — Telephone Encounter (Signed)
Pt states he only has 2 days left of lisinopril and would like 30 days called into [facility] [place]

## 2017-12-02 MED ORDER — LISINOPRIL-HYDROCHLOROTHIAZIDE 20-12.5 MG PO TABS: 1.0000 | ORAL_TABLET | Freq: Every day | ORAL | 3 refills | Status: DC

## 2017-12-13 ENCOUNTER — Ambulatory Visit: Payer: BLUE CROSS/BLUE SHIELD | Admitting: Physician Assistant

## 2017-12-14 ENCOUNTER — Inpatient Hospital Stay: Payer: BLUE CROSS/BLUE SHIELD

## 2017-12-14 ENCOUNTER — Inpatient Hospital Stay: Payer: BLUE CROSS/BLUE SHIELD | Attending: Hematology and Oncology

## 2017-12-20 ENCOUNTER — Ambulatory Visit: Payer: BLUE CROSS/BLUE SHIELD | Admitting: Physician Assistant

## 2017-12-20 DIAGNOSIS — Z23 Encounter for immunization: Secondary | ICD-10-CM | POA: Diagnosis not present

## 2017-12-20 DIAGNOSIS — R7303 Prediabetes: Secondary | ICD-10-CM

## 2017-12-20 DIAGNOSIS — I10 Essential (primary) hypertension: Secondary | ICD-10-CM | POA: Diagnosis not present

## 2017-12-20 DIAGNOSIS — Z720 Tobacco use: Secondary | ICD-10-CM

## 2017-12-20 DIAGNOSIS — N529 Male erectile dysfunction, unspecified: Secondary | ICD-10-CM

## 2017-12-20 DIAGNOSIS — E7800 Pure hypercholesterolemia, unspecified: Secondary | ICD-10-CM | POA: Diagnosis not present

## 2017-12-20 DIAGNOSIS — N400 Enlarged prostate without lower urinary tract symptoms: Secondary | ICD-10-CM | POA: Diagnosis not present

## 2017-12-20 MED ORDER — TAMSULOSIN HCL 0.4 MG PO CAPS: 0.8000 mg | ORAL_CAPSULE | Freq: Every day | ORAL | 0 refills | Status: AC

## 2017-12-20 MED ORDER — LISINOPRIL-HYDROCHLOROTHIAZIDE 20-12.5 MG PO TABS: 1.0000 | ORAL_TABLET | Freq: Every day | ORAL | 0 refills | Status: DC

## 2017-12-20 MED ORDER — SILDENAFIL CITRATE 50 MG PO TABS
50.0000 mg | ORAL_TABLET | ORAL | 3 refills | Status: DC | PRN
Start: 2017-12-20 — End: 2018-07-14

## 2017-12-20 MED ORDER — VARENICLINE TARTRATE 1 MG PO TABS: 1.0000 mg | ORAL_TABLET | Freq: Two times a day (BID) | ORAL | 0 refills | Status: AC

## 2017-12-20 MED ORDER — VARENICLINE TARTRATE 0.5 MG X 11 & 1 MG X 42 PO MISC: ORAL | 0 refills | Status: DC

## 2017-12-20 MED ORDER — METOPROLOL TARTRATE 50 MG PO TABS: 50.0000 mg | ORAL_TABLET | Freq: Two times a day (BID) | ORAL | 3 refills | Status: DC

## 2017-12-20 MED ORDER — SIMVASTATIN 20 MG PO TABS: 20.0000 mg | ORAL_TABLET | Freq: Every day | ORAL | 3 refills | Status: DC

## 2017-12-20 NOTE — Patient Instructions (Signed)
May also google search "American Diabetes association, What can I eat?"   Prediabetes Eating Plan Prediabetes-also called impaired glucose tolerance or impaired fasting glucose-is a condition that causes blood sugar (blood glucose) levels to be higher than normal. Following a healthy diet can help to keep prediabetes under control. It can also help to lower the risk of type 2 diabetes and heart disease, which are increased in people who have prediabetes. Along with regular exercise, a healthy diet:  Promotes weight loss.  Helps to control blood sugar levels.  Helps to improve the way that the body uses insulin.  What do I need to know about this eating plan?  Use the glycemic index (GI) to plan your meals. The index tells you how quickly a food will raise your blood sugar. Choose low-GI foods. These foods take a longer time to raise blood sugar.  Pay close attention to the amount of carbohydrates in the food that you eat. Carbohydrates increase blood sugar levels.  Keep track of how many calories you take in. Eating the right amount of calories will help you to achieve a healthy weight. Losing about 7 percent of your starting weight can help to prevent type 2 diabetes.  You may want to follow a Mediterranean diet. This diet includes a lot of vegetables, lean meats or fish, whole grains, fruits, and healthy oils and fats. What foods can I eat? Grains Whole grains, such as whole-wheat or whole-grain breads, crackers, cereals, and pasta. Unsweetened oatmeal. Bulgur. Barley. Quinoa. Brown rice. Corn or whole-wheat flour tortillas or taco shells. Vegetables Lettuce. Spinach. Peas. Beets. Cauliflower. Cabbage. Broccoli. Carrots. Tomatoes. Squash. Eggplant. Herbs. Peppers. Onions. Cucumbers. Brussels sprouts. Fruits Berries. Bananas. Apples. Oranges. Grapes. Papaya. Mango. Pomegranate. Kiwi. Grapefruit. Cherries. Meats and Other Protein Sources Seafood. Lean meats, such as chicken and [place] or  lean cuts of pork and beef. Tofu. Eggs. Nuts. Beans. Dairy Low-fat or fat-free dairy products, such as yogurt, cottage cheese, and cheese. Beverages Water. Tea. Coffee. Sugar-free or diet soda. Seltzer water. Milk. Milk alternatives, such as soy or almond milk. Condiments Mustard. Relish. Low-fat, low-sugar ketchup. Low-fat, low-sugar barbecue sauce. Low-fat or fat-free mayonnaise. Sweets and Desserts Sugar-free or low-fat pudding. Sugar-free or low-fat ice cream and other frozen treats. Fats and Oils Avocado. Walnuts. Olive oil. The items listed above may not be a complete list of recommended foods or beverages. Contact your dietitian for more options. What foods are not recommended? Grains Refined white flour and flour products, such as bread, pasta, snack foods, and cereals. Beverages Sweetened drinks, such as sweet iced tea and soda. Sweets and Desserts Baked goods, such as cake, cupcakes, pastries, cookies, and cheesecake. The items listed above may not be a complete list of foods and beverages to avoid. Contact your dietitian for more information. This information is not intended to replace advice given to you by your health care provider. Make sure you discuss any questions you have with your health care provider. Document Released: 04/01/2015 Document Revised: 04/22/2016 Document Reviewed: 12/11/2014 Elsevier Interactive Patient Education  2017 [employer].

## 2017-12-20 NOTE — Progress Notes (Signed)
Patient: Jason Bolton Male    DOB: December 01, 1955   62 y.o.   MRN: [id number] Visit Date: 12/20/2017  Today's Provider: Trinna Post, PA-C   Chief Complaint  Patient presents with  . Hyperglycemia  . Hyperlipidemia  . Hypertension   Subjective:    HPI   He has been doing well since last visit. Compliant with medications. He has gone through a stressful time at work where he has had to let some valued employees go due for finances. This has been very difficult on him and he has started smoking again, 1/2 pack per day. He is desiring Chantix today, he has used Chantix before with success to quit smoking.    Hyperglycemia Mellitus Type II, Follow-up:   A1C today: 5.7%  Lab Results  Component Value Date   HGBA1C 6.0 (H) 06/07/2017   Last seen for Pre-diabetes 6 months ago.  Management since then includes None. He reports excellent compliance with treatment. He is not having side effects.  Current symptoms include none and have been stable. Home blood sugar records: Pt is not checking blood sugar at home.   Episodes of hypoglycemia? no   Weight trend: stable Current diet: in general, a "healthy" diet   Current exercise: none  ------------------------------------------------------------------------   Hypertension, follow-up:  BP Readings from Last 3 Encounters:  11/16/17 138/87  10/19/17 123/88  10/17/17 (!) 149/92    He was last seen for hypertension 6 months ago.  Management since that visit includes None. He reports excellent compliance with treatment. He is not having side effects.  He is not exercising. He is adherent to low salt diet.   Outside blood pressures are not being checked. He is experiencing none.  Patient denies chest pain, exertional chest pressure/discomfort, fatigue and lower extremity edema.   Cardiovascular risk factors include advanced age (older than 58 for men, 90 for women), dyslipidemia, hypertension and male gender.  Use  of agents associated with hypertension: none.   ------------------------------------------------------------------------    Lipid/Cholesterol, Follow-up:   Last seen for this 6 months ago.  Management since that visit includes None.  Last Lipid Panel:    Component Value Date/Time   CHOL 167 03/22/2017 0806   TRIG 155 (H) 03/22/2017 0806   HDL 37 (L) 03/22/2017 0806   CHOLHDL 4.5 03/22/2017 0806   LDLCALC 99 03/22/2017 0806    He reports excellent compliance with treatment. He is not having side effects.   Wt Readings from Last 3 Encounters:  10/19/17 287 lb (130.2 kg)  10/17/17 287 lb 8 oz (130.4 kg)  06/27/17 284 lb (128.8 kg)   BPH/Erectile Dysfunction  Taking 0.8mg  Flomax daily with no episodes of dizziness. He is also using Viagra PRN. Tolerating both well. ------------------------------------------------------------------------      Allergies  Allergen Reactions  . Penicillins Other (See Comments)    unknown     Current Outpatient Medications:  .  aspirin 81 MG tablet, Take 81 mg by mouth daily., Disp: , Rfl:  .  Coenzyme Q10 50 MG CAPS, CO ENZYME Q-10, 50MG  (Oral Capsule)  1 po qd for 0 days  Quantity: 30.00;  Refills: 0   Ordered :31-Aug-2010  Margarita Rana MD;  Started 31-Mar-2009 Active Comments: DX: 272.0, Disp: , Rfl:  .  lisinopril-hydrochlorothiazide (ZESTORETIC) 20-12.5 MG tablet, Take 1 tablet by mouth daily., Disp: 90 tablet, Rfl: 0 .  metoprolol tartrate (LOPRESSOR) 50 MG tablet, Take 1 tablet (50 mg total) by mouth 2 (two)  times daily., Disp: 180 tablet, Rfl: 3 .  OMEGA-3 FATTY ACIDS PO, FISH-EPA, 1000MG  (Oral Capsule)  2 po bid for 0 days  Quantity: 0.00;  Refills: 0   Ordered :31-Aug-2010  Margarita Rana MD;  Started 19-June-2007 Active Comments: DX: 272.0, Disp: , Rfl:  .  sildenafil (VIAGRA) 50 MG tablet, Take 1 tablet (50 mg total) by mouth as needed for erectile dysfunction. Reported on 03/15/2016, Disp: 8 tablet, Rfl: 3 .  simvastatin (ZOCOR)  20 MG tablet, Take 1 tablet (20 mg total) by mouth at bedtime., Disp: 90 tablet, Rfl: 3 .  tamsulosin (FLOMAX) 0.4 MG CAPS capsule, Take 2 capsules (0.8 mg total) by mouth daily., Disp: 180 capsule, Rfl: 0 .  varenicline (CHANTIX PAK) 0.5 MG X 11 & 1 MG X 42 tablet, Take one 0.5 mg tabl by mouth 1x daily for 3 days, then increase to one 0.5 mg tab 2x daily for 4 days, then increase to one 1 mg tab 2x day, Disp: 53 tablet, Rfl: 0 .  varenicline (CHANTIX) 1 MG tablet, Take 1 tablet (1 mg total) by mouth 2 (two) times daily., Disp: 120 tablet, Rfl: 0  Review of Systems  Constitutional: Negative.   Respiratory: Negative.  Negative for chest tightness and shortness of breath.   Cardiovascular: Negative.  Negative for chest pain and palpitations.  Gastrointestinal: Negative.   Endocrine: Negative.   Neurological: Negative for dizziness, light-headedness and headaches.    Social History   Tobacco Use  . Smoking status: Former Smoker    Packs/day: 1.50    Years: 20.00    Pack years: 30.00    Types: Cigarettes    Last attempt to quit: 11/29/2015    Years since quitting: 2.0  . Smokeless tobacco: Never Used  . Tobacco comment: quit June of 2008 after Chantix. he was exposed to secondary smoke  Substance Use Topics  . Alcohol use: Yes    Alcohol/week: 0.0 oz    Comment: occasional   Objective:   There were no vitals taken for this visit. There were no vitals filed for this visit.   Physical Exam  Constitutional: He is oriented to person, place, and time. He appears well-developed and well-nourished.  Cardiovascular: Normal rate and regular rhythm.  Pulmonary/Chest: Effort normal and breath sounds normal.  Neurological: He is alert and oriented to person, place, and time.  Skin: Skin is warm and dry.  Psychiatric: He has a normal mood and affect. His behavior is normal.        Assessment & Plan:     1. Essential hypertension  - Comprehensive Metabolic Panel (CMET)  2.  Hypercholesterolemia without hypertriglyceridemia  - Lipid Profile - simvastatin (ZOCOR) 20 MG tablet; Take 1 tablet (20 mg total) by mouth at bedtime.  Dispense: 90 tablet; Refill: 3  3. Prediabetes  Improved, provided printouts for diabetic diets.   4. Tobacco abuse  I have counseled this patient at least three minutes about smoking cessation. We will proceed with Chantix.  - varenicline (CHANTIX PAK) 0.5 MG X 11 & 1 MG X 42 tablet; Take one 0.5 mg tabl by mouth 1x daily for 3 days, then increase to one 0.5 mg tab 2x daily for 4 days, then increase to one 1 mg tab 2x day  Dispense: 53 tablet; Refill: 0 - varenicline (CHANTIX) 1 MG tablet; Take 1 tablet (1 mg total) by mouth 2 (two) times daily.  Dispense: 120 tablet; Refill: 0  5. Benign prostatic hyperplasia, unspecified whether lower  urinary tract symptoms present  Refilled flomax.  6. Essential (primary) hypertension  Checking CMET.  - lisinopril-hydrochlorothiazide (ZESTORETIC) 20-12.5 MG tablet; Take 1 tablet by mouth daily.  Dispense: 90 tablet; Refill: 0 - metoprolol tartrate (LOPRESSOR) 50 MG tablet; Take 1 tablet (50 mg total) by mouth 2 (two) times daily.  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 Reported on 03/15/2016  Dispense: 8 tablet; Refill: 3  8. Need for shingles vaccine  1st dose administered today, can update second at physical.  - Varicella-zoster vaccine IM (Shingrix)  Return in about 6 months (around 06/19/2018) for CPE.  The entirety of the information documented in the History of Present Illness, Review of Systems and Physical Exam were personally obtained by me. Portions of this information were initially documented by Ashley Royalty, CMA and reviewed by me for thoroughness and accuracy.        Trinna Post, PA-C  [facility]

## 2018-01-12 ENCOUNTER — Inpatient Hospital Stay: Payer: BLUE CROSS/BLUE SHIELD

## 2018-01-16 ENCOUNTER — Other Ambulatory Visit: Payer: Self-pay | Admitting: Hematology and Oncology

## 2018-01-17 ENCOUNTER — Inpatient Hospital Stay: Payer: BLUE CROSS/BLUE SHIELD

## 2018-01-18 ENCOUNTER — Inpatient Hospital Stay: Payer: BLUE CROSS/BLUE SHIELD | Attending: Hematology and Oncology

## 2018-01-18 ENCOUNTER — Inpatient Hospital Stay: Payer: BLUE CROSS/BLUE SHIELD

## 2018-01-18 DIAGNOSIS — D751 Secondary polycythemia: Secondary | ICD-10-CM | POA: Insufficient documentation

## 2018-01-18 LAB — HEMATOCRIT: HCT: 49.4 % (ref 40.0–52.0)

## 2018-02-09 ENCOUNTER — Inpatient Hospital Stay: Payer: BLUE CROSS/BLUE SHIELD | Attending: Hematology and Oncology

## 2018-02-09 ENCOUNTER — Inpatient Hospital Stay: Payer: BLUE CROSS/BLUE SHIELD

## 2018-03-09 ENCOUNTER — Other Ambulatory Visit: Payer: Self-pay

## 2018-03-09 ENCOUNTER — Inpatient Hospital Stay: Payer: BLUE CROSS/BLUE SHIELD

## 2018-03-09 ENCOUNTER — Inpatient Hospital Stay: Payer: BLUE CROSS/BLUE SHIELD | Attending: Hematology and Oncology

## 2018-03-09 VITALS — BP 135/88 | HR 78 | Temp 98.5°F | Resp 20

## 2018-03-09 DIAGNOSIS — D751 Secondary polycythemia: Secondary | ICD-10-CM | POA: Insufficient documentation

## 2018-03-09 LAB — HEMATOCRIT: HCT: 49.1 % (ref 40.0–52.0)

## 2018-03-14 ENCOUNTER — Other Ambulatory Visit: Payer: Self-pay | Admitting: Physician Assistant

## 2018-03-25 ENCOUNTER — Other Ambulatory Visit: Payer: Self-pay | Admitting: Physician Assistant

## 2018-03-25 DIAGNOSIS — I10 Essential (primary) hypertension: Secondary | ICD-10-CM

## 2018-04-05 ENCOUNTER — Telehealth: Payer: Self-pay | Admitting: *Deleted

## 2018-04-05 NOTE — Telephone Encounter (Signed)
Patient left message on answering service that he would call back.  I called patient and got voice mail.  Left message for him to contact our Triage nurse and she will relay message to Dr Kem Parkinson team since we are missing each other's calls.

## 2018-04-06 ENCOUNTER — Ambulatory Visit: Payer: BLUE CROSS/BLUE SHIELD | Admitting: Hematology and Oncology

## 2018-04-06 ENCOUNTER — Other Ambulatory Visit: Payer: BLUE CROSS/BLUE SHIELD

## 2018-04-11 ENCOUNTER — Telehealth: Payer: Self-pay | Admitting: *Deleted

## 2018-04-11 NOTE — Telephone Encounter (Signed)
Patient was on [facility] Bidding 04/14/18 schedule for lab/NP/+/- Phlebotomy. I was told to R/S Some patients Per Bryan's request. I was able to get the OK per Shirlean Mylar that patient could be added on the [place] schedule on 04/12/18. However, I Called patient to see if he would be willing to be seen in [place] on 04/12/18.  Patient stated that he was out of town and said that he would call back one day next week to R/S His lab/MD/+/- Phlebotomy appts.

## 2018-04-14 ENCOUNTER — Ambulatory Visit: Payer: BLUE CROSS/BLUE SHIELD | Admitting: Urgent Care

## 2018-04-14 ENCOUNTER — Other Ambulatory Visit: Payer: BLUE CROSS/BLUE SHIELD

## 2018-04-20 ENCOUNTER — Telehealth: Payer: Self-pay | Admitting: *Deleted

## 2018-04-20 NOTE — Telephone Encounter (Signed)
Per Beverlee Nims 04/20/18 Scheduler message: Patient called back to R/S his Lab/MD/+/- Phlebotomy appt from 04/14/18 to 05/02/18.  He was made aware of date and time of his scheduled appt.

## 2018-05-01 ENCOUNTER — Other Ambulatory Visit: Payer: Self-pay | Admitting: *Deleted

## 2018-05-01 DIAGNOSIS — D751 Secondary polycythemia: Secondary | ICD-10-CM

## 2018-05-02 ENCOUNTER — Inpatient Hospital Stay: Payer: BLUE CROSS/BLUE SHIELD

## 2018-05-02 ENCOUNTER — Other Ambulatory Visit: Payer: Self-pay | Admitting: Hematology and Oncology

## 2018-05-02 ENCOUNTER — Inpatient Hospital Stay (HOSPITAL_BASED_OUTPATIENT_CLINIC_OR_DEPARTMENT_OTHER): Payer: BLUE CROSS/BLUE SHIELD | Admitting: Hematology and Oncology

## 2018-05-02 ENCOUNTER — Inpatient Hospital Stay: Payer: BLUE CROSS/BLUE SHIELD | Attending: Hematology and Oncology

## 2018-05-02 ENCOUNTER — Encounter: Payer: Self-pay | Admitting: Hematology and Oncology

## 2018-05-02 VITALS — BP 144/93 | HR 85 | Temp 96.5°F | Resp 18 | Wt 297.3 lb

## 2018-05-02 VITALS — BP 125/89 | HR 60 | Resp 18

## 2018-05-02 DIAGNOSIS — R51 Headache: Secondary | ICD-10-CM

## 2018-05-02 DIAGNOSIS — Z79899 Other long term (current) drug therapy: Secondary | ICD-10-CM | POA: Diagnosis not present

## 2018-05-02 DIAGNOSIS — I10 Essential (primary) hypertension: Secondary | ICD-10-CM

## 2018-05-02 DIAGNOSIS — G4730 Sleep apnea, unspecified: Secondary | ICD-10-CM

## 2018-05-02 DIAGNOSIS — Z87891 Personal history of nicotine dependence: Secondary | ICD-10-CM | POA: Insufficient documentation

## 2018-05-02 DIAGNOSIS — D751 Secondary polycythemia: Secondary | ICD-10-CM

## 2018-05-02 DIAGNOSIS — E7800 Pure hypercholesterolemia, unspecified: Secondary | ICD-10-CM | POA: Diagnosis not present

## 2018-05-02 DIAGNOSIS — R635 Abnormal weight gain: Secondary | ICD-10-CM | POA: Diagnosis not present

## 2018-05-02 DIAGNOSIS — Z7982 Long term (current) use of aspirin: Secondary | ICD-10-CM | POA: Insufficient documentation

## 2018-05-02 DIAGNOSIS — Z800 Family history of malignant neoplasm of digestive organs: Secondary | ICD-10-CM | POA: Insufficient documentation

## 2018-05-02 LAB — CBC WITH DIFFERENTIAL/PLATELET
Basophils Absolute: 0.1 10*3/uL (ref 0–0.1)
Basophils Relative: 1 %
Eosinophils Absolute: 0.1 10*3/uL (ref 0–0.7)
Eosinophils Relative: 2 %
HCT: 50.8 % (ref 40.0–52.0)
Hemoglobin: 16.4 g/dL (ref 13.0–18.0)
Lymphocytes Relative: 18 %
Lymphs Abs: 1.4 10*3/uL (ref 1.0–3.6)
MCH: 24.6 pg — ABNORMAL LOW (ref 26.0–34.0)
MCHC: 32.2 g/dL (ref 32.0–36.0)
MCV: 76.1 fL — ABNORMAL LOW (ref 80.0–100.0)
Monocytes Absolute: 0.7 10*3/uL (ref 0.2–1.0)
Monocytes Relative: 9 %
Neutro Abs: 5.6 10*3/uL (ref 1.4–6.5)
Neutrophils Relative %: 70 %
Platelets: 143 10*3/uL — ABNORMAL LOW (ref 150–440)
RBC: 6.67 MIL/uL — ABNORMAL HIGH (ref 4.40–5.90)
RDW: 17.9 % — ABNORMAL HIGH (ref 11.5–14.5)
WBC: 7.9 10*3/uL (ref 3.8–10.6)

## 2018-05-02 LAB — FERRITIN: Ferritin: 12 ng/mL — ABNORMAL LOW (ref 24–336)

## 2018-05-02 NOTE — Progress Notes (Signed)
Patient offers no complaints today.  Just recovered from sinus infection/seasonal allergies.

## 2018-05-02 NOTE — Progress Notes (Signed)
[facility] day:  05/02/2018  Chief Complaint: Jason Bolton is a 62 y.o. 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  HPI:  The patient was last seen in the hematology clinic on 10/17/2017.  At that time, he was feeling better. He denied any physical concerns.  He recently started smoking again.  Exam was stable. Hematocrit was 47.4.  MCV was 77.6.  Additional labs on 10/17/2017:  Epo level was 25.3 (2.6-18.5).  Carbon monoxide level was 9.1% (high).  JAK2 V617F and exon 12-15 were negative.  Ferritin was 5.  He underwent phlebotomy on 11/16/2017 and 03/09/2018.  During the interim, patient is doing well today. He does not express any acute concerns. Patient denies B symptoms. He has not experienced any significant interval infections. Patient has known OSAH syndrome. He uses nocturnal PAP therapy and notes that he feels "great". Patient has developed a "dull headache" over the last 2 days. He states, "I know my hematocrit is up. When it gets high I get this headache".  Patient continues to smoke about a half pack of cigarettes daily. Patient notes that he is eating well. Weight has increased by 10 pounds.   Patient denies pain in the clinic today.    Past Medical History:  Diagnosis Date  . Allergy   . Hypercholesterolemia   . Hypertension   . 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 Polycythemia   . Sleep apnea     Past Surgical History:  Procedure Laterality Date  . CATARACT EXTRACTION Right 11/2013  . COLONOSCOPY  2006  . COLONOSCOPY WITH PROPOFOL N/A 10/19/2017   Procedure: COLONOSCOPY WITH PROPOFOL;  Surgeon: [name], MD;  Location: [facility] ENDOSCOPY;  Service: Endoscopy;  Laterality: N/A;  . INGUINAL HERNIA REPAIR Right 1972  . MANDIBLE SURGERY  2005   [facility]    Family History  Problem Relation Age of Onset  . Diabetes Mother   . Congestive Heart Failure  Mother   . Pancreatic cancer Father   . Healthy Sister   . Healthy Brother   . Healthy Brother   . Healthy Brother     Social History:  reports that he quit smoking about 2 years ago. His smoking use included cigarettes. He has a 30.00 pack-year smoking history. He has never used smokeless tobacco. He reports that he drinks alcohol. He reports that he does not use drugs.  He stopped smoking 2 years ago.  Patient is smoking 8-10 cigarettes again at this point.  He has not had any exposure to formaldehyde in 20 years.  He runs 2 funeral homes.  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  Allergies:  Allergies  Allergen Reactions  . Penicillins Other (See Comments)    unknown    Current Medications: Current Outpatient Medications  Medication Sig Dispense Refill  . aspirin 81 MG tablet Take 81 mg by mouth daily.    . Coenzyme Q10 50 MG CAPS CO ENZYME Q-10, 50MG  (Oral Capsule)  1 po qd for 0 days  Quantity: 30.00;  Refills: 0   Ordered :31-Aug-2010  Margarita Rana MD;  Started 31-Mar-2009 Active Comments: DX: 272.0    . lisinopril-hydrochlorothiazide (PRINZIDE,ZESTORETIC) 20-12.5 MG tablet TAKE 1 TABLET BY MOUTH DAILY 90 tablet 0  . metoprolol tartrate (LOPRESSOR) 50 MG tablet Take 1 tablet (50 mg total) by mouth 2 (two) times daily. 180 tablet 3  . montelukast (SINGULAIR) 10 MG  tablet TAKE 1 TABLET BY MOUTH EVERY DAY 90 tablet 1  . OMEGA-3 FATTY ACIDS PO FISH-EPA, 1000MG  (Oral Capsule)  2 po bid for 0 days  Quantity: 0.00;  Refills: 0   Ordered :31-Aug-2010  Margarita Rana MD;  Started 19-June-2007 Active Comments: DX: 272.0    . sildenafil (VIAGRA) 50 MG tablet Take 1 tablet (50 mg total) by mouth as needed for erectile dysfunction. Reported on 03/15/2016 8 tablet 3  . simvastatin (ZOCOR) 20 MG tablet Take 1 tablet (20 mg total) by mouth at bedtime. 90 tablet 3  . tamsulosin (FLOMAX) 0.4 MG CAPS capsule TAKE 2 CAPSULES BY MOUTH DAILY 180 capsule 0  . varenicline (CHANTIX PAK) 0.5 MG X 11 & 1 MG X 42 tablet  Take one 0.5 mg tabl by mouth 1x daily for 3 days, then increase to one 0.5 mg tab 2x daily for 4 days, then increase to one 1 mg tab 2x day 53 tablet 0   No current facility-administered medications for this visit.     Review of Systems  Constitutional: Negative for diaphoresis, fever, malaise/fatigue and weight loss (weight up 10 pounds).  HENT: Negative.   Eyes: Negative.   Respiratory: Positive for shortness of breath (exertional). Negative for cough, hemoptysis and sputum production.        OSAH syndrome - uses nocturnal PAP therapy  Cardiovascular: Negative for chest pain, palpitations, orthopnea, leg swelling and PND.  Gastrointestinal: Negative for abdominal pain, blood in stool, constipation, diarrhea, melena, nausea and vomiting.  Genitourinary: Negative for dysuria, frequency, hematuria and urgency.  Musculoskeletal: Negative for back pain, falls, joint pain and myalgias.  Skin: Negative for itching and rash.  Neurological: Positive for headaches (dull). Negative for dizziness, tremors and weakness.  Endo/Heme/Allergies: Does not bruise/bleed easily.  Psychiatric/Behavioral: Negative for depression, memory loss and suicidal ideas. The patient is not nervous/anxious and does not have insomnia.   All other systems reviewed and are negative.  Performance status (ECOG): 1 - Symptomatic but completely ambulatory    Physical Exam: Blood pressure (!) 144/93, pulse 85, temperature (!) 96.5 F (35.8 C), resp. rate 18, weight 297 lb 5 oz (134.9 kg), SpO2 94 %. GENERAL:  Well developed, well nourished, heavyset gentleman sitting comfortably in the exam room in no acute distress. MENTAL STATUS:  Alert and oriented to person, place and time. HEAD:  Gray spiked hair.  Normocephalic, atraumatic, face symmetric, no Cushingoid features. EYES:  Blue eyes.  Pupils equal round and reactive to light and accomodation.  No conjunctivitis or scleral icterus. ENT:  Oropharynx clear without lesion.   Tongue normal. Mucous membranes moist.  RESPIRATORY:  Clear to auscultation without rales, wheezes or rhonchi. CARDIOVASCULAR:  Regular rate and rhythm without murmur, rub or gallop. ABDOMEN:  Fully round.  Soft, non-tender, with active bowel sounds, and no appreciable hepatosplenomegaly.  No masses. SKIN:  No rashes, ulcers or lesions. EXTREMITIES: No edema, no skin discoloration or tenderness.  No palpable cords. LYMPH NODES: No palpable cervical, supraclavicular, axillary or inguinal adenopathy  NEUROLOGICAL: Unremarkable. PSYCH:  Appropriate.    Appointment on 05/02/2018  Component Date Value Ref Range Status  . WBC 05/02/2018 7.9  3.8 - 10.6 K/uL Final  . RBC 05/02/2018 6.67* 4.40 - 5.90 MIL/uL Final  . Hemoglobin 05/02/2018 16.4  13.0 - 18.0 g/dL Final  . HCT 05/02/2018 50.8  40.0 - 52.0 % Final  . MCV 05/02/2018 76.1* 80.0 - 100.0 fL Final  . MCH 05/02/2018 24.6* 26.0 - 34.0 pg Final  .  MCHC 05/02/2018 32.2  32.0 - 36.0 g/dL Final  . RDW 05/02/2018 17.9* 11.5 - 14.5 % Final  . Platelets 05/02/2018 143* 150 - 440 K/uL Final  . Neutrophils Relative % 05/02/2018 70  % Final  . Neutro Abs 05/02/2018 5.6  1.4 - 6.5 K/uL Final  . Lymphocytes Relative 05/02/2018 18  % Final  . Lymphs Abs 05/02/2018 1.4  1.0 - 3.6 K/uL Final  . Monocytes Relative 05/02/2018 9  % Final  . Monocytes Absolute 05/02/2018 0.7  0.2 - 1.0 K/uL Final  . Eosinophils Relative 05/02/2018 2  % Final  . Eosinophils Absolute 05/02/2018 0.1  0 - 0.7 K/uL Final  . Basophils Relative 05/02/2018 1  % Final  . Basophils Absolute 05/02/2018 0.1  0 - 0.1 K/uL Final   Performed at [facility], [address], [place], [place] [address]    Assessment:  SHAHIR KAREN is a 62 y.o. 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   Etiology is felt secondary to sleep apnea.  He uses CPAP.  He has a 30 pack year smoking history.  He stopped smoking in 2016.  He does not use testosterone.  Initial labs ruled out  polycythemia rubra vera. JAK2 V617F and exon 12 were negative.  Erythropoietin level was normal.  He is on a baby aspirin.  He undergoes phlebotomy to keep his hematocrit < 48.  Last phlebotomy was on 03/09/2018.    Ferritin has been followed: 6 on 05/12/2017 5 on 10/17/2017, 6 on 11/16/2017, and 12 on 05/02/2018.  Symptomatically, he feels good.  He is using CPAP.  He continues to smoke.  Exam is stable.  Hematocrit is 50.8 and hemoglobin 16.4.  Plan: 1. Labs today:  CBC with diff, epo level, ferritin. 2. Hematocrit 50.8. Goal is < 48.  Therapeutic phlebotomy required today.  3. Discuss smoking cessation.  4. RTC monthly for HCT +/- phlebotomy. Coordinate with [facility] phlebotomy program.  5. RTC in 6 months for MD assessment, labs (CBC with diff, ferritin) +/- phlebotomy.   Honor Loh, NP  05/02/2018, 11:44 AM   I saw and evaluated the patient, participating in the key portions of the service and reviewing pertinent diagnostic studies and records.  I reviewed the nurse practitioner's note and agree with the findings and the plan.  The assessment and plan were discussed with the patient.  A few questions were asked by the patient and answered.   Nolon Stalls, MD 05/02/2018,11:44 AM

## 2018-05-03 LAB — ERYTHROPOIETIN: Erythropoietin: 34.6 m[IU]/mL — ABNORMAL HIGH (ref 2.6–18.5)

## 2018-06-12 ENCOUNTER — Other Ambulatory Visit: Payer: Self-pay | Admitting: Physician Assistant

## 2018-06-12 DIAGNOSIS — I10 Essential (primary) hypertension: Secondary | ICD-10-CM

## 2018-06-26 ENCOUNTER — Inpatient Hospital Stay: Payer: BLUE CROSS/BLUE SHIELD | Attending: Hematology and Oncology

## 2018-06-26 ENCOUNTER — Inpatient Hospital Stay: Payer: BLUE CROSS/BLUE SHIELD

## 2018-06-26 DIAGNOSIS — D751 Secondary polycythemia: Secondary | ICD-10-CM

## 2018-06-26 LAB — HEMATOCRIT: HCT: 50.1 % (ref 40.0–52.0)

## 2018-07-11 IMAGING — CT CT ABD-PELV W/ CM
1 of 3 series · 13 of 32 positions shown, 18 images · IV contrast (iopamidol)
Comparison: 08/18/2012

CLINICAL DATA: Acquired polycythemia.

EXAM:
CT ABDOMEN AND PELVIS WITH CONTRAST
TECHNIQUE: Multidetector CT imaging of the abdomen and pelvis was performed
using the standard protocol following bolus administration of
intravenous contrast.
CONTRAST:  100mL 9ZTHWO-YC8 IOPAMIDOL (9ZTHWO-YC8) INJECTION 76%

[Series 2: axial st · axial · 0.85mm/px · z∈[-1030,-570]mm · 13 of 104 slices shown, 18 images]
[im 6/104  soft-tissue]
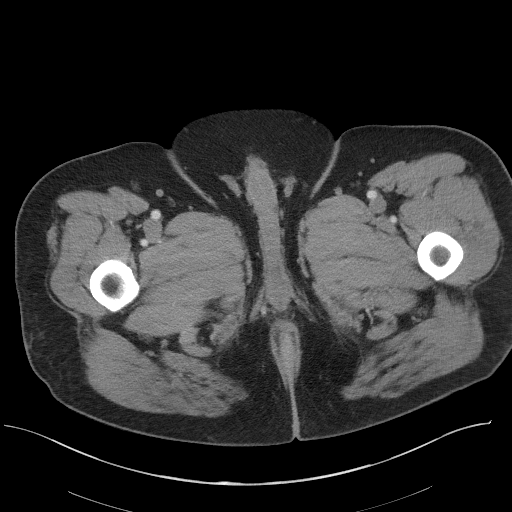
[im 6/104  bone]
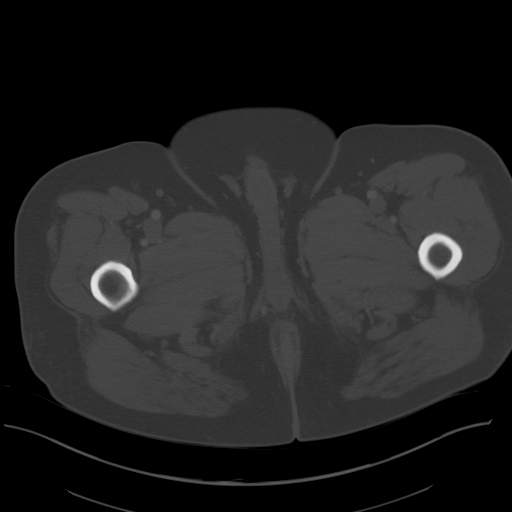
[im 18/104  soft-tissue]
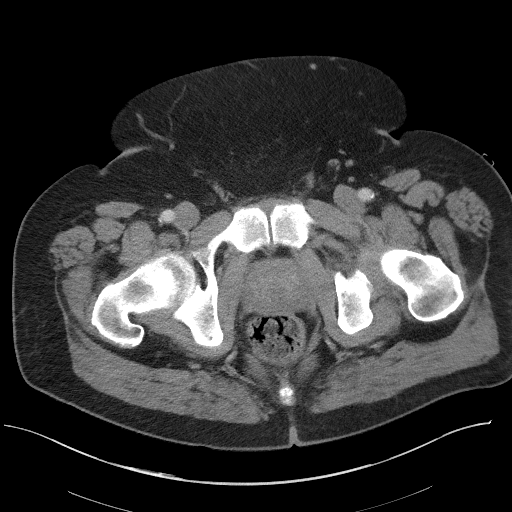
[im 23/104  soft-tissue]
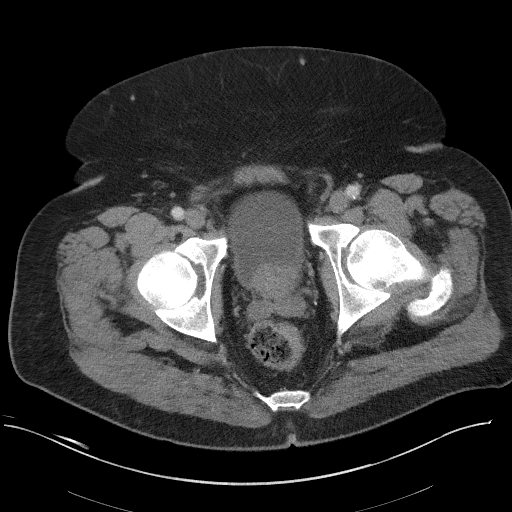
[im 29/104  soft-tissue]
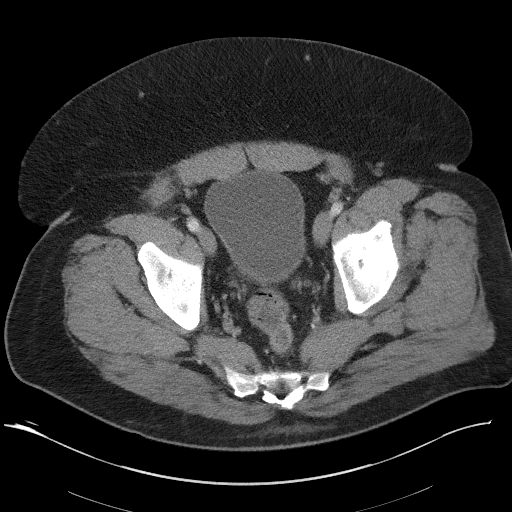
[im 41/104  soft-tissue]
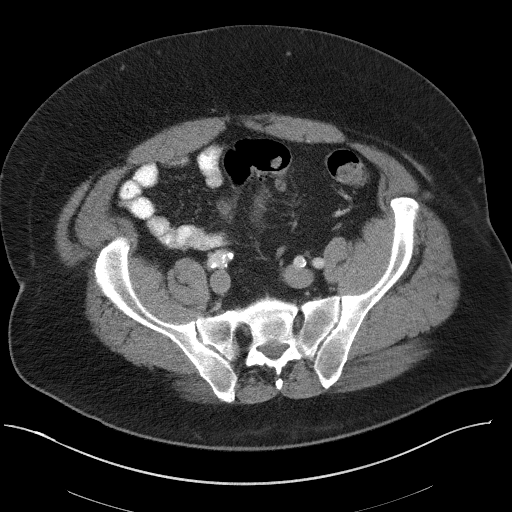
[im 46/104  soft-tissue]
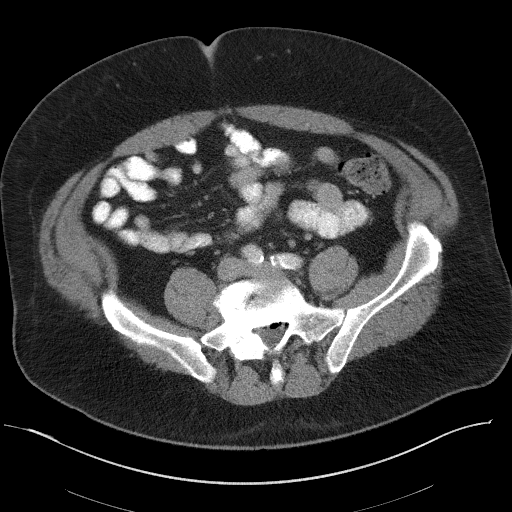
[im 58/104  soft-tissue]
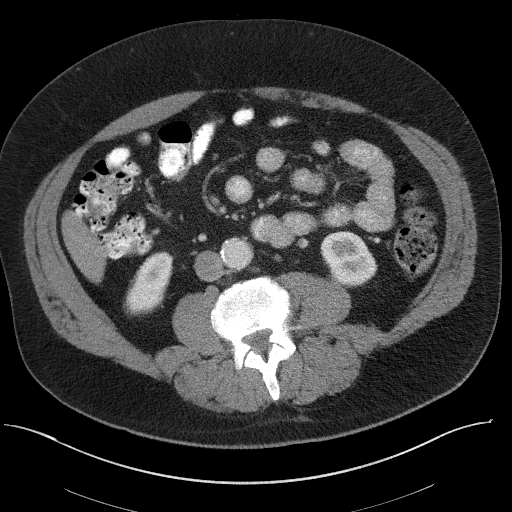
[im 63/104  soft-tissue]
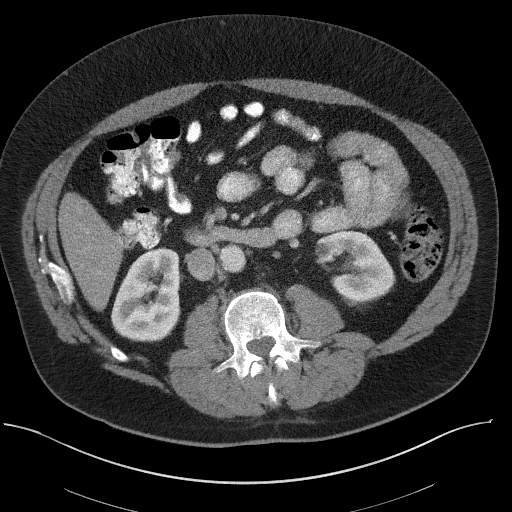
[im 75/104  soft-tissue]
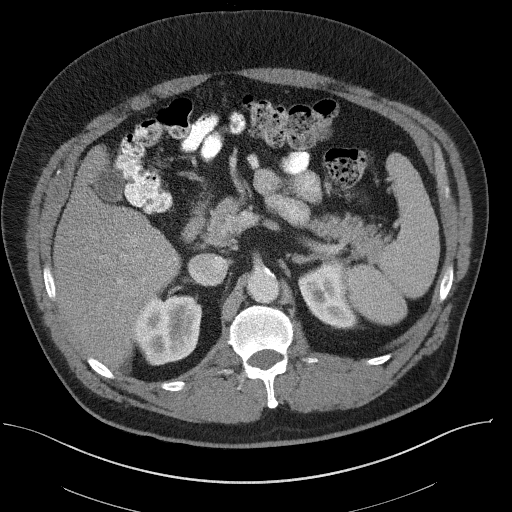
[im 75/104  bone]
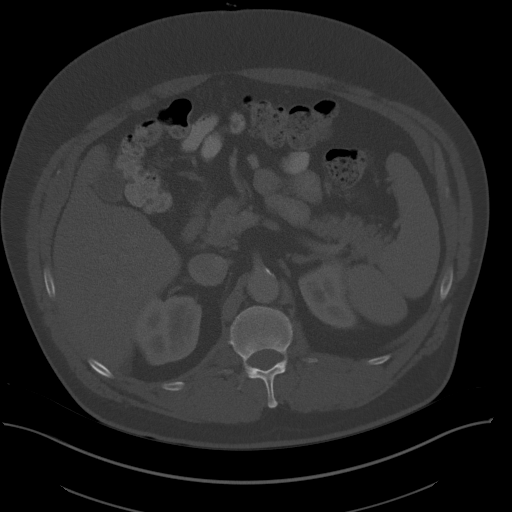
[im 81/104  soft-tissue]
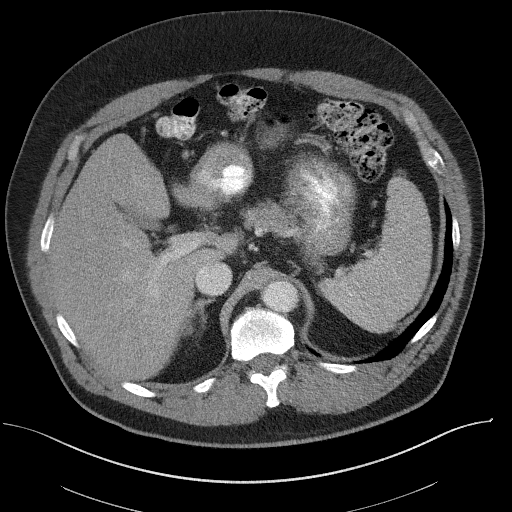
[im 81/104  lung]
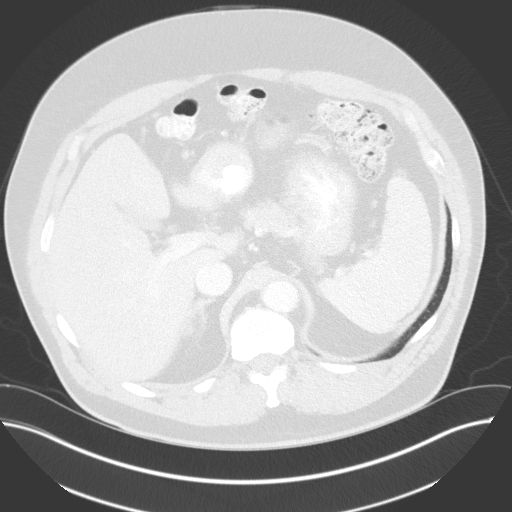
[im 86/104  soft-tissue]
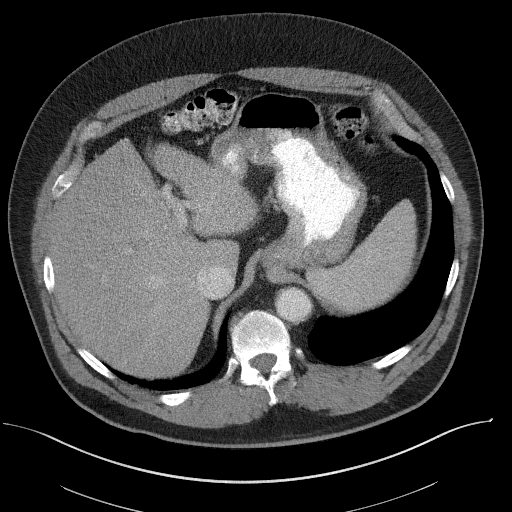
[im 86/104  lung]
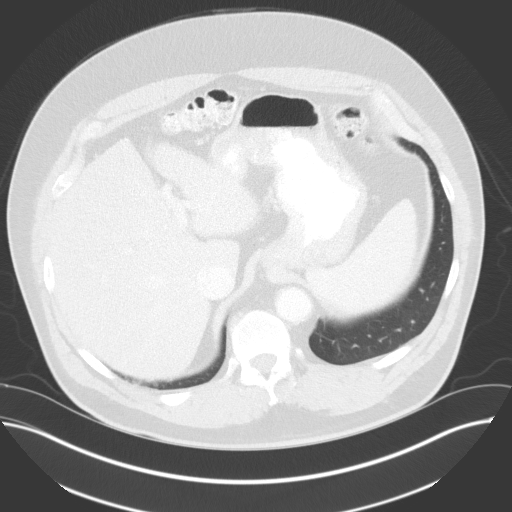
[im 92/104  lung]
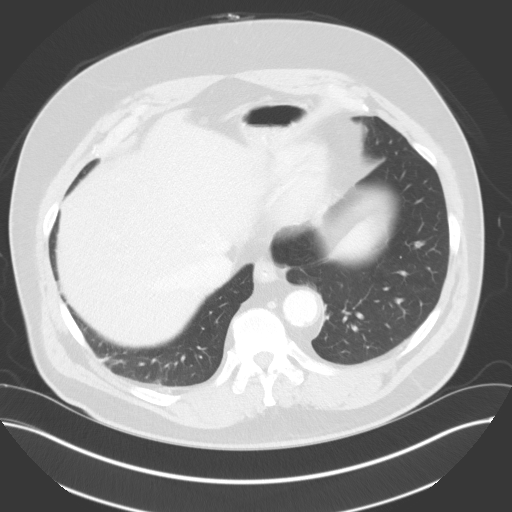
[im 98/104  soft-tissue]
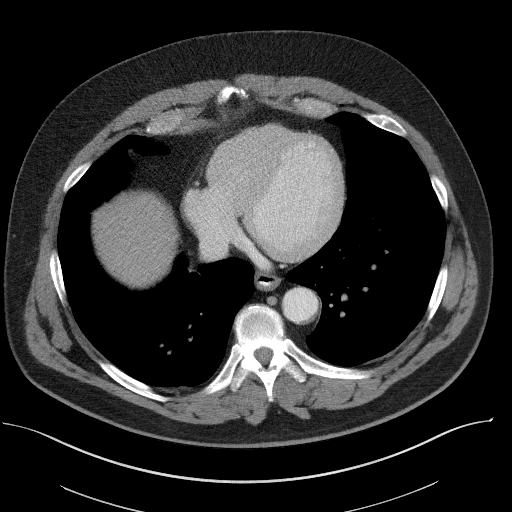
[im 98/104  lung]
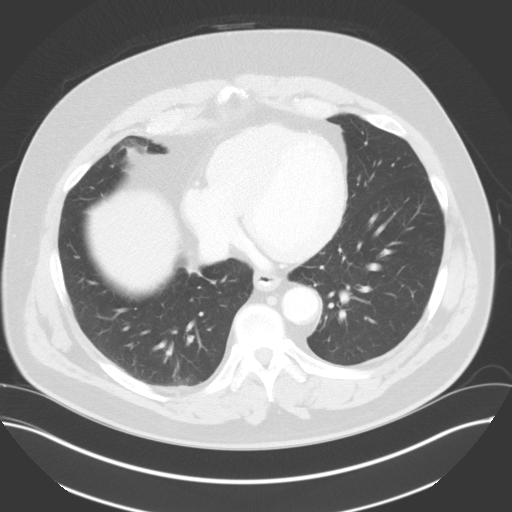

[13 of 32 positions shown; findings below may reference images not displayed]

FINDINGS: Lower chest: The lung bases are clear of acute process. No pleural
effusion or pulmonary lesions. The heart is normal in size. No
pericardial effusion. The distal esophagus and aorta are
unremarkable.

Hepatobiliary: Scattered hepatic cysts are stable. No worrisome
hepatic lesions or intrahepatic biliary dilatation. The gallbladder
appears normal. No common bile duct dilatation.

Pancreas: No mass, inflammation or ductal dilatation.

Spleen: Normal size.  No focal lesions.

Adrenals/Urinary Tract: The adrenal glands and kidneys are
unremarkable. Small scattered renal cysts are noted. No renal,
ureteral or bladder calculi are mass.

Stomach/Bowel: The stomach, duodenum, small bowel and colon are
unremarkable. No acute inflammatory changes, mass lesions or
obstructive findings. The terminal ileum is normal. The appendix is
normal. Moderate stool noted throughout the colon.

Vascular/Lymphatic: Scattered atherosclerotic calcifications
involving the aorta and iliac arteries. No aneurysm or dissection.
The major venous structures are patent. Small scattered mesenteric
and retroperitoneal lymph nodes but no mass or adenopathy.

Reproductive: The prostate gland is mildly enlarged. Median lobe
hypertrophy impressing on the base of the bladder. Seminal vesicles
are normal.

Other: No pelvic mass or adenopathy. No free pelvic fluid
collections. No inguinal mass or adenopathy. No abdominal wall
hernia or subcutaneous lesions.

Musculoskeletal: No significant bony findings. Lumbar scoliosis and
degenerative lumbar spondylosis is noted in the lower lumbar spine.
IMPRESSION: 1. No acute abdominal/pelvic findings, mass lesions or
lymphadenopathy.
2. Stable scattered hepatic and renal cysts.
3. Moderate atherosclerotic calcifications involving the aorta and
iliac arteries.
4. Moderate prostate gland enlargement.

## 2018-07-14 ENCOUNTER — Encounter: Payer: Self-pay | Admitting: Physician Assistant

## 2018-07-14 ENCOUNTER — Ambulatory Visit: Payer: BLUE CROSS/BLUE SHIELD | Admitting: Physician Assistant

## 2018-07-14 VITALS — BP 126/86 | HR 78 | Temp 98.1°F | Wt 286.0 lb

## 2018-07-14 DIAGNOSIS — J301 Allergic rhinitis due to pollen: Secondary | ICD-10-CM | POA: Diagnosis not present

## 2018-07-14 DIAGNOSIS — E7800 Pure hypercholesterolemia, unspecified: Secondary | ICD-10-CM | POA: Diagnosis not present

## 2018-07-14 DIAGNOSIS — N400 Enlarged prostate without lower urinary tract symptoms: Secondary | ICD-10-CM

## 2018-07-14 DIAGNOSIS — R7303 Prediabetes: Secondary | ICD-10-CM | POA: Diagnosis not present

## 2018-07-14 DIAGNOSIS — N529 Male erectile dysfunction, unspecified: Secondary | ICD-10-CM

## 2018-07-14 DIAGNOSIS — I10 Essential (primary) hypertension: Secondary | ICD-10-CM

## 2018-07-14 DIAGNOSIS — Z23 Encounter for immunization: Secondary | ICD-10-CM

## 2018-07-14 LAB — POCT GLYCOSYLATED HEMOGLOBIN (HGB A1C): Hemoglobin A1C: 5.7 % — AB (ref 4.0–5.6)

## 2018-07-14 MED ORDER — LISINOPRIL-HYDROCHLOROTHIAZIDE 20-12.5 MG PO TABS: 1.0000 | ORAL_TABLET | Freq: Every day | ORAL | 0 refills | Status: DC

## 2018-07-14 MED ORDER — TAMSULOSIN HCL 0.4 MG PO CAPS: 0.8000 mg | ORAL_CAPSULE | Freq: Every day | ORAL | 0 refills | Status: DC

## 2018-07-14 MED ORDER — MONTELUKAST SODIUM 10 MG PO TABS: 10.0000 mg | ORAL_TABLET | Freq: Every day | ORAL | 1 refills | Status: DC

## 2018-07-14 MED ORDER — SIMVASTATIN 20 MG PO TABS: 20.0000 mg | ORAL_TABLET | Freq: Every day | ORAL | 3 refills | Status: DC

## 2018-07-14 MED ORDER — METOPROLOL TARTRATE 50 MG PO TABS: 50.0000 mg | ORAL_TABLET | Freq: Two times a day (BID) | ORAL | 3 refills | Status: DC

## 2018-07-14 MED ORDER — SILDENAFIL CITRATE 50 MG PO TABS: 50.0000 mg | ORAL_TABLET | ORAL | 3 refills | Status: DC | PRN

## 2018-07-14 NOTE — Progress Notes (Signed)
Patient: Jason Bolton Male    DOB: Jun 14, 1956   62 y.o.   MRN: [id number] Visit Date: 07/14/2018  Today's Provider: Trinna Post, PA-C   Chief Complaint  Patient presents with  . Hypertension  . Hyperlipidemia  . Hyperglycemia   Subjective:    Hypertension  This is a chronic problem. The problem is unchanged. The problem is controlled. Pertinent negatives include no anxiety, blurred vision, chest pain, headaches, malaise/fatigue, neck pain, orthopnea, palpitations, peripheral edema, PND, shortness of breath or sweats. There are no associated agents to hypertension. There are no compliance problems.   Hyperlipidemia  This is a chronic problem. The problem is controlled. Pertinent negatives include no chest pain or shortness of breath. Current antihyperlipidemic treatment includes statins. There are no compliance problems.   Hyperglycemia  Pertinent negatives include no chest pain, headaches or neck pain.   Has cut out sweets and carbs, has lost 10 lbs since June. A1c today is 5.7%.  Due for 2nd Shingles Vaccine today. Tobacco Abuse: smoking 1 pack per week. He does have Chantix but has not used it today.  He is turning 63 next year and is unsure if he will go to Medicare or not.   Wt Readings from Last 3 Encounters:  07/14/18 286 lb (129.7 kg)  05/02/18 297 lb 5 oz (134.9 kg)  10/19/17 287 lb (130.2 kg)     Lab Results  Component Value Date   HGBA1C 5.7 (A) 07/14/2018   BP Readings from Last 3 Encounters:  07/14/18 126/86  05/02/18 125/89  05/02/18 (!) 144/93   Lab Results  Component Value Date   CHOL 167 03/22/2017   CHOL 131 04/28/2016   CHOL 164 01/15/2015   Lab Results  Component Value Date   HDL 37 (L) 03/22/2017   HDL 41 04/28/2016   HDL 46 01/15/2015   Lab Results  Component Value Date   LDLCALC 99 03/22/2017   LDLCALC 72 04/28/2016   LDLCALC 101 01/15/2015   Lab Results  Component Value Date   TRIG 155 (H) 03/22/2017   TRIG 89  04/28/2016   TRIG 87 01/15/2015   Lab Results  Component Value Date   CHOLHDL 4.5 03/22/2017   CHOLHDL 3.2 04/28/2016   No results found for: LDLDIRECT     Allergies  Allergen Reactions  . Penicillins Other (See Comments)    unknown     Current Outpatient Medications:  .  aspirin 81 MG tablet, Take 81 mg by mouth daily., Disp: , Rfl:  .  Coenzyme Q10 50 MG CAPS, CO ENZYME Q-10, '50MG'$  (Oral Capsule)  1 po qd for 0 days  Quantity: 30.00;  Refills: 0   Ordered :31-Aug-2010  Margarita Rana MD;  Started 31-Mar-2009 Active Comments: DX: 272.0, Disp: , Rfl:  .  lisinopril-hydrochlorothiazide (PRINZIDE,ZESTORETIC) 20-12.5 MG tablet, Take 1 tablet by mouth daily., Disp: 90 tablet, Rfl: 0 .  metoprolol tartrate (LOPRESSOR) 50 MG tablet, Take 1 tablet (50 mg total) by mouth 2 (two) times daily., Disp: 180 tablet, Rfl: 3 .  montelukast (SINGULAIR) 10 MG tablet, Take 1 tablet (10 mg total) by mouth daily., Disp: 90 tablet, Rfl: 1 .  OMEGA-3 FATTY ACIDS PO, FISH-EPA, '1000MG'$  (Oral Capsule)  2 po bid for 0 days  Quantity: 0.00;  Refills: 0   Ordered :31-Aug-2010  Margarita Rana MD;  Started 19-June-2007 Active Comments: DX: 272.0, Disp: , Rfl:  .  sildenafil (VIAGRA) 50 MG tablet, Take 1 tablet (50 mg  total) by mouth as needed for erectile dysfunction. Reported on 03/15/2016, Disp: 8 tablet, Rfl: 3 .  simvastatin (ZOCOR) 20 MG tablet, Take 1 tablet (20 mg total) by mouth at bedtime., Disp: 90 tablet, Rfl: 3 .  tamsulosin (FLOMAX) 0.4 MG CAPS capsule, Take 2 capsules (0.8 mg total) by mouth daily., Disp: 180 capsule, Rfl: 0 .  varenicline (CHANTIX PAK) 0.5 MG X 11 & 1 MG X 42 tablet, Take one 0.5 mg tabl by mouth 1x daily for 3 days, then increase to one 0.5 mg tab 2x daily for 4 days, then increase to one 1 mg tab 2x day (Patient not taking: Reported on 07/14/2018), Disp: 53 tablet, Rfl: 0  Review of Systems  Constitutional: Negative.  Negative for malaise/fatigue.  Eyes: Negative for blurred vision.    Respiratory: Negative.  Negative for shortness of breath.   Cardiovascular: Negative.  Negative for chest pain, palpitations, orthopnea and PND.  Gastrointestinal: Negative.   Endocrine: Negative.   Musculoskeletal: Negative for neck pain.  Neurological: Negative for dizziness, light-headedness and headaches.    Social History   Tobacco Use  . Smoking status: Former Smoker    Packs/day: 1.50    Years: 20.00    Pack years: 30.00    Types: Cigarettes    Last attempt to quit: 11/29/2015    Years since quitting: 2.6  . Smokeless tobacco: Never Used  . Tobacco comment: quit June of 2008 after Chantix. he was exposed to secondary smoke  Substance Use Topics  . Alcohol use: Yes    Alcohol/week: 0.0 standard drinks    Comment: occasional   Objective:   BP 126/86 (BP Location: Left Arm, Patient Position: Sitting, Cuff Size: Large)   Pulse 78   Temp 98.1 F (36.7 C) (Oral)   Wt 286 lb (129.7 kg)   SpO2 96%   BMI 37.73 kg/m  Vitals:   07/14/18 0828  BP: 126/86  Pulse: 78  Temp: 98.1 F (36.7 C)  TempSrc: Oral  SpO2: 96%  Weight: 286 lb (129.7 kg)     Physical Exam  Constitutional: He appears well-developed and well-nourished.  Cardiovascular: Normal rate and regular rhythm.        Assessment & Plan:     1. Essential hypertension  Controlled today, continue current medications. Labs as below. Will schedule 6 mo follow up as welcome to medicare but it may not be depending on insurance.   - Comp Met (CMET)  2. Prediabetes  A1c today is 5.7% which is down from 6.0% last year. This is great, he has lost 10 lbs in 2 months, keep up the good work.   - POCT glycosylated hemoglobin (Hb A1C)  3. Hypercholesterolemia without hypertriglyceridemia  - Lipid Profile - simvastatin (ZOCOR) 20 MG tablet; Take 1 tablet (20 mg total) by mouth at bedtime.  Dispense: 90 tablet; Refill: 3  4. Hay fever  - montelukast (SINGULAIR) 10 MG tablet; Take 1 tablet (10 mg total) by  mouth daily.  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 Reported on 03/15/2016  Dispense: 8 tablet; Refill: 3  6. Essential (primary) hypertension  - metoprolol tartrate (LOPRESSOR) 50 MG tablet; Take 1 tablet (50 mg total) by mouth 2 (two) times daily.  Dispense: 180 tablet; Refill: 3 - lisinopril-hydrochlorothiazide (PRINZIDE,ZESTORETIC) 20-12.5 MG tablet; Take 1 tablet by mouth daily.  Dispense:  90 tablet; Refill: 0  7. Benign prostatic hyperplasia, unspecified whether lower urinary tract symptoms present  - tamsulosin (FLOMAX) 0.4 MG CAPS capsule; Take 2 capsules (0.8 mg total) by mouth daily.  Dispense: 180 capsule; Refill: 0  8. Need for Shingles Vaccine  Updated today.   Return in about 6 months (around 01/14/2019) for welcome to medicare .  The entirety of the information documented in the History of Present Illness, Review of Systems and Physical Exam were personally obtained by me. Portions of this information were initially documented by Ashley Royalty, CMA and reviewed by me for thoroughness and accuracy.        Trinna Post, PA-C  [facility]

## 2018-07-15 LAB — LIPID PANEL
Chol/HDL Ratio: 4.8 ratio (ref 0.0–5.0)
Cholesterol, Total: 149 mg/dL (ref 100–199)
HDL: 31 mg/dL — ABNORMAL LOW (ref >39)
LDL Calculated: 96 mg/dL (ref 0–99)
Triglycerides: 110 mg/dL (ref 0–149)
VLDL Cholesterol Cal: 22 mg/dL (ref 5–40)

## 2018-07-15 LAB — COMPREHENSIVE METABOLIC PANEL
ALT: 18 IU/L (ref 0–44)
AST: 17 IU/L (ref 0–40)
Albumin/Globulin Ratio: 1.8 (ref 1.2–2.2)
Albumin: 4.5 g/dL (ref 3.6–4.8)
Alkaline Phosphatase: 53 IU/L (ref 39–117)
BUN/Creatinine Ratio: 21 (ref 10–24)
BUN: 21 mg/dL (ref 8–27)
Bilirubin Total: 0.4 mg/dL (ref 0.0–1.2)
CO2: 23 mmol/L (ref 20–29)
Calcium: 10 mg/dL (ref 8.6–10.2)
Chloride: 102 mmol/L (ref 96–106)
Creatinine, Ser: 1.01 mg/dL (ref 0.76–1.27)
GFR calc Af Amer: 90 mL/min/{1.73_m2} (ref >59)
GFR calc non Af Amer: 78 mL/min/{1.73_m2} (ref >59)
Globulin, Total: 2.5 g/dL (ref 1.5–4.5)
Glucose: 109 mg/dL — ABNORMAL HIGH (ref 65–99)
Potassium: 4.5 mmol/L (ref 3.5–5.2)
Sodium: 141 mmol/L (ref 134–144)
Total Protein: 7 g/dL (ref 6.0–8.5)

## 2018-07-17 ENCOUNTER — Telehealth: Payer: Self-pay

## 2018-07-17 NOTE — Telephone Encounter (Signed)
Left message advising pt.  (Per DPR)  Thanks,   -Sandford Diop  

## 2018-07-17 NOTE — Telephone Encounter (Signed)
-----   Message from Trinna Post, [place] sent at 07/17/2018  4:37 PM EDT ----- Labs stable, continue excellent weight loss efforts.

## 2018-07-24 ENCOUNTER — Inpatient Hospital Stay: Payer: BLUE CROSS/BLUE SHIELD | Attending: Hematology and Oncology

## 2018-07-24 ENCOUNTER — Inpatient Hospital Stay: Payer: BLUE CROSS/BLUE SHIELD

## 2018-08-21 ENCOUNTER — Inpatient Hospital Stay: Payer: BLUE CROSS/BLUE SHIELD

## 2018-08-29 ENCOUNTER — Inpatient Hospital Stay: Payer: BLUE CROSS/BLUE SHIELD

## 2018-08-29 ENCOUNTER — Inpatient Hospital Stay: Payer: BLUE CROSS/BLUE SHIELD | Attending: Hematology and Oncology

## 2018-08-29 DIAGNOSIS — D751 Secondary polycythemia: Secondary | ICD-10-CM | POA: Insufficient documentation

## 2018-08-29 LAB — HEMATOCRIT: HCT: 51.2 % (ref 40.0–52.0)

## 2018-09-25 ENCOUNTER — Other Ambulatory Visit: Payer: BLUE CROSS/BLUE SHIELD

## 2018-09-25 ENCOUNTER — Inpatient Hospital Stay: Payer: BLUE CROSS/BLUE SHIELD

## 2018-09-25 ENCOUNTER — Other Ambulatory Visit: Payer: Self-pay | Admitting: Hematology and Oncology

## 2018-09-25 DIAGNOSIS — D751 Secondary polycythemia: Secondary | ICD-10-CM

## 2018-09-25 LAB — HEMATOCRIT: HCT: 52.8 % — ABNORMAL HIGH (ref 39.0–52.0)

## 2018-11-03 ENCOUNTER — Other Ambulatory Visit: Payer: BLUE CROSS/BLUE SHIELD

## 2018-11-03 ENCOUNTER — Inpatient Hospital Stay: Payer: BLUE CROSS/BLUE SHIELD | Admitting: Hematology and Oncology

## 2018-11-10 ENCOUNTER — Inpatient Hospital Stay (HOSPITAL_BASED_OUTPATIENT_CLINIC_OR_DEPARTMENT_OTHER): Payer: BLUE CROSS/BLUE SHIELD | Admitting: Hematology and Oncology

## 2018-11-10 ENCOUNTER — Encounter: Payer: Self-pay | Admitting: Hematology and Oncology

## 2018-11-10 ENCOUNTER — Inpatient Hospital Stay: Payer: BLUE CROSS/BLUE SHIELD | Attending: Hematology and Oncology

## 2018-11-10 ENCOUNTER — Inpatient Hospital Stay: Payer: BLUE CROSS/BLUE SHIELD

## 2018-11-10 ENCOUNTER — Other Ambulatory Visit: Payer: Self-pay | Admitting: Hematology and Oncology

## 2018-11-10 VITALS — BP 138/88 | HR 76 | Temp 98.9°F | Resp 18 | Wt 296.0 lb

## 2018-11-10 VITALS — BP 126/89 | HR 80 | Resp 20

## 2018-11-10 DIAGNOSIS — R7989 Other specified abnormal findings of blood chemistry: Secondary | ICD-10-CM | POA: Insufficient documentation

## 2018-11-10 DIAGNOSIS — Z7289 Other problems related to lifestyle: Secondary | ICD-10-CM | POA: Insufficient documentation

## 2018-11-10 DIAGNOSIS — F17210 Nicotine dependence, cigarettes, uncomplicated: Secondary | ICD-10-CM | POA: Diagnosis not present

## 2018-11-10 DIAGNOSIS — D751 Secondary polycythemia: Secondary | ICD-10-CM

## 2018-11-10 DIAGNOSIS — G4733 Obstructive sleep apnea (adult) (pediatric): Secondary | ICD-10-CM | POA: Diagnosis not present

## 2018-11-10 DIAGNOSIS — Z800 Family history of malignant neoplasm of digestive organs: Secondary | ICD-10-CM | POA: Insufficient documentation

## 2018-11-10 DIAGNOSIS — R0602 Shortness of breath: Secondary | ICD-10-CM | POA: Diagnosis not present

## 2018-11-10 LAB — CBC WITH DIFFERENTIAL/PLATELET
Abs Immature Granulocytes: 0.09 10*3/uL — ABNORMAL HIGH (ref 0.00–0.07)
Basophils Absolute: 0.1 10*3/uL (ref 0.0–0.1)
Basophils Relative: 1 %
Eosinophils Absolute: 0.1 10*3/uL (ref 0.0–0.5)
Eosinophils Relative: 1 %
HCT: 52.8 % — ABNORMAL HIGH (ref 39.0–52.0)
Hemoglobin: 15.3 g/dL (ref 13.0–17.0)
Immature Granulocytes: 1 %
Lymphocytes Relative: 19 %
Lymphs Abs: 2.1 10*3/uL (ref 0.7–4.0)
MCH: 21.6 pg — ABNORMAL LOW (ref 26.0–34.0)
MCHC: 29 g/dL — ABNORMAL LOW (ref 30.0–36.0)
MCV: 74.7 fL — ABNORMAL LOW (ref 80.0–100.0)
Monocytes Absolute: 1 10*3/uL (ref 0.1–1.0)
Monocytes Relative: 9 %
Neutro Abs: 7.9 10*3/uL — ABNORMAL HIGH (ref 1.7–7.7)
Neutrophils Relative %: 69 %
Platelets: 215 10*3/uL (ref 150–400)
RBC: 7.07 MIL/uL — ABNORMAL HIGH (ref 4.22–5.81)
RDW: 19.5 % — ABNORMAL HIGH (ref 11.5–15.5)
WBC: 11.3 10*3/uL — ABNORMAL HIGH (ref 4.0–10.5)
nRBC: 0 % (ref 0.0–0.2)

## 2018-11-10 LAB — FERRITIN: Ferritin: 6 ng/mL — ABNORMAL LOW (ref 24–336)

## 2018-11-10 NOTE — Progress Notes (Signed)
Pt in for follow up, denies any difficulties or concerns today. 

## 2018-11-10 NOTE — Progress Notes (Signed)
[facility] day:  11/10/2018  Chief Complaint: Jason Bolton is a 62 y.o. 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  HPI:  The patient was last seen in the hematology clinic on 05/02/2018.  At that time, he felt good.  He was using CPAP.  He continued to smoke.  Exam was stable.  Hematocrit was 50.8 and hemoglobin 16.4.  We discussed smoking cessation.  He underwent phlebotomy.  Hematocrit was 50.1 on 06/26/2018, 51.2 on 08/29/2018, and 52.8 on 09/25/2018.  He underwent phlebotomy on 08/29/2018.  During the interim, he has felt "fine".  He denies any headaches or visual changes.  He describes being on an antibiotic for 4-5 weeks for a sinus infection.   Past Medical History:  Diagnosis Date  . Allergy   . Hypercholesterolemia   . Hypertension   . 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 Polycythemia   . Sleep apnea     Past Surgical History:  Procedure Laterality Date  . CATARACT EXTRACTION Right 11/2013  . COLONOSCOPY  2006  . COLONOSCOPY WITH PROPOFOL N/A 10/19/2017   Procedure: COLONOSCOPY WITH PROPOFOL;  Surgeon: [name], MD;  Location: [facility] ENDOSCOPY;  Service: Endoscopy;  Laterality: N/A;  . INGUINAL HERNIA REPAIR Right 1972  . MANDIBLE SURGERY  2005   [facility]    Family History  Problem Relation Age of Onset  . Diabetes Mother   . Congestive Heart Failure Mother   . Pancreatic cancer Father   . Healthy Sister   . Healthy Brother   . Healthy Brother   . Healthy Brother     Social History:  reports that he quit smoking about 3 years ago. His smoking use included cigarettes. He has a 30.00 pack-year smoking history. He has never used smokeless tobacco. He reports current alcohol use. He reports that he does not use drugs.  He stopped smoking 2 years ago.  Patient is smoking 8-10 cigarettes again at this point.  He has not had any exposure to  formaldehyde in 20 years.  He runs 2 funeral homes.  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  Allergies:  Allergies  Allergen Reactions  . Penicillins Other (See Comments)    unknown    Current Medications: Current Outpatient Medications  Medication Sig Dispense Refill  . aspirin 81 MG tablet Take 81 mg by mouth daily.    . Coenzyme Q10 50 MG CAPS CO ENZYME Q-10, 50MG  (Oral Capsule)  1 po qd for 0 days  Quantity: 30.00;  Refills: 0   Ordered :31-Aug-2010  Margarita Rana MD;  Started 31-Mar-2009 Active Comments: DX: 272.0    . metoprolol tartrate (LOPRESSOR) 50 MG tablet Take 1 tablet (50 mg total) by mouth 2 (two) times daily. 180 tablet 3  . OMEGA-3 FATTY ACIDS PO FISH-EPA, 1000MG  (Oral Capsule)  2 po bid for 0 days  Quantity: 0.00;  Refills: 0   Ordered :31-Aug-2010  Margarita Rana MD;  Started 19-June-2007 Active Comments: DX: 272.0    . sildenafil (VIAGRA) 50 MG tablet Take 1 tablet (50 mg total) by mouth as needed for erectile dysfunction. Reported on 03/15/2016 8 tablet 3  . simvastatin (ZOCOR) 20 MG tablet Take 1 tablet (20 mg total) by mouth at bedtime. 90 tablet 3  . lisinopril-hydrochlorothiazide (PRINZIDE,ZESTORETIC) 20-12.5 MG tablet Take 1 tablet by mouth daily. 90 tablet 0  . montelukast (SINGULAIR) 10 MG tablet TAKE 1  TABLET BY MOUTH DAILY GENERIC EQUIVALENT FOR SINGULAIR 90 tablet 1  . tamsulosin (FLOMAX) 0.4 MG CAPS capsule Take 2 capsules (0.8 mg total) by mouth daily. 180 capsule 0   No current facility-administered medications for this visit.     Review of Systems  Constitutional: Positive for weight loss (1 pound). Negative for chills, diaphoresis, fever and malaise/fatigue.       Feels "fine".  HENT: Negative for congestion, ear discharge, ear pain, nosebleeds, sinus pain and sore throat.        Interval sinus infection.  Eyes: Negative.  Negative for blurred vision, double vision, photophobia and pain.  Respiratory: Positive for shortness of breath (exertional). Negative for  cough, hemoptysis and sputum production.        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  Negative for chest pain, palpitations, orthopnea, leg swelling and PND.  Gastrointestinal: Negative for abdominal pain, blood in stool, constipation, diarrhea, melena, nausea and vomiting.  Genitourinary: Negative.  Negative for frequency, hematuria and urgency.       No testosterone use.  Musculoskeletal: Negative.  Negative for back pain, falls, joint pain, myalgias and neck pain.  Skin: Negative.  Negative for itching and rash.  Neurological: Negative.  Negative for dizziness, tremors, sensory change, speech change, focal weakness, weakness and headaches.  Endo/Heme/Allergies: Negative.  Does not bruise/bleed easily.  Psychiatric/Behavioral: Negative.  Negative for depression and memory loss. The patient is not nervous/anxious and does not have insomnia.   All other systems reviewed and are negative.  Performance status (ECOG): 0   Physical Exam: Blood pressure 138/88, pulse 76, temperature 98.9 F (37.2 C), temperature source Tympanic, resp. rate 18, weight 296 lb (134.3 kg). GENERAL:  Well developed, well nourished, gentleman sitting comfortably in the exam room in no acute distress. MENTAL STATUS:  Alert and oriented to person, place and time. HEAD:  Gray spiked hair.  Normocephalic, atraumatic, face symmetric, no Cushingoid features. EYES:  Blue eyes.  Pupils equal round and reactive to light and accomodation.  No conjunctivitis or scleral icterus. ENT:  Oropharynx clear without lesion.  Tongue normal. Mucous membranes moist.  RESPIRATORY:  Clear to auscultation without rales, wheezes or rhonchi. CARDIOVASCULAR:  Regular rate and rhythm without murmur, rub or gallop. ABDOMEN:  Fully round.  Soft, non-tender, with active bowel sounds, and no appreciable hepatosplenomegaly.  No masses. SKIN:  No rashes, ulcers or lesions. EXTREMITIES: No edema, no skin discoloration  or tenderness.  No palpable cords. LYMPH NODES: No palpable cervical, supraclavicular, axillary or inguinal adenopathy  NEUROLOGICAL: Unremarkable. PSYCH:  Appropriate.    Appointment on 11/10/2018  Component Date Value Ref Range Status  . Erythropoietin 11/10/2018 34.3* 2.6 - 18.5 mIU/mL Final   Comment: (NOTE) [facility] [employer] D[facility] [place] obtained with different assay methods or kits cannot be used interchangeably. Results cannot be interpreted as absolute evidence of the presence or absence of malignant disease. Performed At: [facility] [place], [place] [id number] Rush Farmer MD [id number]   . Ferritin 11/10/2018 6* 24 - 336 ng/mL Final   Performed at [facility], [address] [place], [place] [address]  . WBC 11/10/2018 11.3* 4.0 - 10.5 K/uL Final  . RBC 11/10/2018 7.07* 4.22 - 5.81 MIL/uL Final  . Hemoglobin 11/10/2018 15.3  13.0 - 17.0 g/dL Final  . HCT 11/10/2018 52.8* 39.0 - 52.0 % Final  . MCV 11/10/2018 74.7* 80.0 - 100.0 fL Final  . MCH 11/10/2018 21.6*  26.0 - 34.0 pg Final  . MCHC 11/10/2018 29.0* 30.0 - 36.0 g/dL Final  . RDW 11/10/2018 19.5* 11.5 - 15.5 % Final  . Platelets 11/10/2018 215  150 - 400 K/uL Final  . nRBC 11/10/2018 0.0  0.0 - 0.2 % Final  . Neutrophils Relative % 11/10/2018 69  % Final  . Neutro Abs 11/10/2018 7.9* 1.7 - 7.7 K/uL Final  . Lymphocytes Relative 11/10/2018 19  % Final  . Lymphs Abs 11/10/2018 2.1  0.7 - 4.0 K/uL Final  . Monocytes Relative 11/10/2018 9  % Final  . Monocytes Absolute 11/10/2018 1.0  0.1 - 1.0 K/uL Final  . Eosinophils Relative 11/10/2018 1  % Final  . Eosinophils Absolute 11/10/2018 0.1  0.0 - 0.5 K/uL Final  . Basophils Relative 11/10/2018 1  % Final  . Basophils Absolute 11/10/2018 0.1  0.0 - 0.1 K/uL Final  . Immature Granulocytes 11/10/2018 1  % Final  . Abs Immature Granulocytes 11/10/2018 0.09* 0.00 - 0.07 K/uL Final   Performed at [facility], [address], [place], [place] [address]    Assessment:  Jason Bolton is a 62 y.o. 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   Etiology is felt secondary to sleep apnea.  He uses CPAP.  He has a 30 pack year smoking history.  He stopped smoking in 2016.  He does not use testosterone.  Initial labs ruled out polycythemia rubra vera. JAK2 V617F and exon 12 were negative on 10/17/2017.  Erythropoietin level was normal.  He is on a baby aspirin.  He undergoes phlebotomy to keep his hematocrit < 48.  Last phlebotomy was on 08/29/2018.    Ferritin has been followed: 6 on 05/12/2017 5 on 10/17/2017, 6 on 11/16/2017, 12 on 05/02/2018, and 6 on 11/10/2018.  Epo level was 25.3 on 10/17/2017, 34.6 on 05/02/2018, and 34.3 on 11/10/2018.  Symptomatically, he feels "fine".  He uses his CPAP.  He continues to smoke.  Exam is stable.  Hematocrit is 52.8 and hemoglobin 15.3.  Plan: 1. Labs today: CBC with diff, epo level, ferritin, BCR-ABL. 2. Secondary polycythemia  Hematocrit 52.8. Goal is < 48.    Phlebotomy today.  RTC monthly in [place] for CBC +/- phlebotomy. 3.   Elevated erythropoietin level  Expect elevated epo level in secondary polycythemia.  Epo level elevation due to response to hypoxia or an epo-secreting tumor.  The degree of elevation does not reveal the underlying cause.  Recheck today (level in initially "normal" in 2016 then increasing on subsequent checks. 4.   RTC n 6 months in [place] for MD assessment, labs (CBC with diff, ferritin) and +/- phlebotomy.   Lequita Asal, MD  11/10/2018, 4:35 PM

## 2018-11-11 LAB — ERYTHROPOIETIN: Erythropoietin: 34.3 m[IU]/mL — ABNORMAL HIGH (ref 2.6–18.5)

## 2018-11-30 ENCOUNTER — Other Ambulatory Visit: Payer: Self-pay | Admitting: Physician Assistant

## 2018-11-30 DIAGNOSIS — N400 Enlarged prostate without lower urinary tract symptoms: Secondary | ICD-10-CM

## 2018-11-30 DIAGNOSIS — I10 Essential (primary) hypertension: Secondary | ICD-10-CM

## 2018-11-30 MED ORDER — TAMSULOSIN HCL 0.4 MG PO CAPS: 0.8000 mg | ORAL_CAPSULE | Freq: Every day | ORAL | 0 refills | Status: DC

## 2018-11-30 MED ORDER — LISINOPRIL-HYDROCHLOROTHIAZIDE 20-12.5 MG PO TABS: 1.0000 | ORAL_TABLET | Freq: Every day | ORAL | 0 refills | Status: DC

## 2018-11-30 NOTE — Telephone Encounter (Deleted)
nj 

## 2018-11-30 NOTE — Telephone Encounter (Addendum)
[facility] faxed refill request for the following medications:  tamsulosin (FLOMAX) 0.4 MG CAPS capsule   lisinopril-hydrochlorothiazide (PRINZIDE,ZESTORETIC) 20-12.5 MG tablet    Please advise.

## 2018-12-08 ENCOUNTER — Inpatient Hospital Stay: Payer: BLUE CROSS/BLUE SHIELD

## 2018-12-08 ENCOUNTER — Inpatient Hospital Stay: Payer: BLUE CROSS/BLUE SHIELD | Attending: Hematology and Oncology

## 2018-12-08 VITALS — BP 132/84 | HR 84 | Temp 97.0°F | Resp 20

## 2018-12-08 DIAGNOSIS — D751 Secondary polycythemia: Secondary | ICD-10-CM

## 2018-12-08 DIAGNOSIS — D571 Sickle-cell disease without crisis: Secondary | ICD-10-CM | POA: Diagnosis not present

## 2018-12-08 LAB — HEMATOCRIT: HCT: 52.3 % — ABNORMAL HIGH (ref 39.0–52.0)

## 2018-12-19 ENCOUNTER — Other Ambulatory Visit: Payer: Self-pay | Admitting: Physician Assistant

## 2018-12-19 DIAGNOSIS — J301 Allergic rhinitis due to pollen: Secondary | ICD-10-CM

## 2019-01-03 ENCOUNTER — Ambulatory Visit (INDEPENDENT_AMBULATORY_CARE_PROVIDER_SITE_OTHER): Payer: BLUE CROSS/BLUE SHIELD | Admitting: Physician Assistant

## 2019-01-03 ENCOUNTER — Encounter: Payer: Self-pay | Admitting: Physician Assistant

## 2019-01-03 VITALS — BP 129/84 | HR 83 | Temp 97.9°F | Resp 16 | Ht 73.0 in | Wt 285.6 lb

## 2019-01-03 DIAGNOSIS — Z1159 Encounter for screening for other viral diseases: Secondary | ICD-10-CM

## 2019-01-03 DIAGNOSIS — I10 Essential (primary) hypertension: Secondary | ICD-10-CM

## 2019-01-03 DIAGNOSIS — Z125 Encounter for screening for malignant neoplasm of prostate: Secondary | ICD-10-CM | POA: Diagnosis not present

## 2019-01-03 DIAGNOSIS — R7303 Prediabetes: Secondary | ICD-10-CM | POA: Diagnosis not present

## 2019-01-03 DIAGNOSIS — Z0000 Encounter for general adult medical examination without abnormal findings: Secondary | ICD-10-CM | POA: Diagnosis not present

## 2019-01-03 DIAGNOSIS — Z23 Encounter for immunization: Secondary | ICD-10-CM | POA: Diagnosis not present

## 2019-01-03 DIAGNOSIS — E7800 Pure hypercholesterolemia, unspecified: Secondary | ICD-10-CM | POA: Diagnosis not present

## 2019-01-03 DIAGNOSIS — Z114 Encounter for screening for human immunodeficiency virus [HIV]: Secondary | ICD-10-CM | POA: Diagnosis not present

## 2019-01-03 NOTE — Progress Notes (Signed)
Patient: Jason Bolton, Male    DOB: [birth year], 63 y.o.   MRN: [id number] Visit Date: 01/03/2019  Today's Provider: Trinna Post, PA-C   Chief Complaint  Patient presents with  . Annual Exam   Subjective:     Annual physical exam JUBAL RADEMAKER is a 63 y.o. male who presents today for health maintenance and complete physical. He feels well. He reports exercising none. He reports he is sleeping well. 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   Colonoscopy: 05/2017 10 mm benign polyp, repeat 5 years Zostavax: Completed.  Pneumovax: 01/15/2015 for smoking  Prevnar: Due today  HTN: Taking Prinzide 20-12.5 mg daily and metoprolol tartrate 50 mg twice daily. No chest pain, SOB.  BP Readings from Last 3 Encounters:  01/03/19 129/84  12/08/18 132/84  11/10/18 126/89   Prediabetes: Last A1c at 5.7%. Has lost 10 lbs since the holidays. Does not drink sugary drinks. Does not eat fried foods anymore.   Lab Results  Component Value Date   HGBA1C 5.7 (A) 07/14/2018   Wt Readings from Last 3 Encounters:  01/03/19 285 lb 9.6 oz (129.5 kg)  11/10/18 296 lb (134.3 kg)  07/14/18 286 lb (129.7 kg)     HLD: Taking 20 mg simvastatin without issue.  BPH: Taking flomax 0.8 mg daily without issue.   Erectile Dysfunction: Taking viagra 50 mg daily PRN.   -----------------------------------------------------------------   Review of Systems  Constitutional: Negative.   HENT: Negative.   Eyes: Negative.   Respiratory: Negative.   Cardiovascular: Negative.   Gastrointestinal: Negative.   Endocrine: Negative.   Genitourinary: Negative.   Musculoskeletal: Negative.   Skin: Negative.   Allergic/Immunologic: Negative.   Neurological: Negative.   Hematological: Negative.   Psychiatric/Behavioral: Negative.     Social History      He  reports that he quit smoking about 3 years ago. His smoking use included  cigarettes. He has a 30.00 pack-year smoking history. He has never used smokeless tobacco. He reports current alcohol use. He reports that he does not use drugs.       Social History   Socioeconomic History  . Marital status: Married    Spouse name: Not on file  . Number of children: 2  . Years of education: Not on file  . Highest education level: Not on file  Occupational History  . Occupation: Librarian, academic  Social Needs  . Financial resource strain: Not on file  . Food insecurity:    Worry: Not on file    Inability: Not on file  . Transportation needs:    Medical: Not on file    Non-medical: Not on file  Tobacco Use  . Smoking status: Former Smoker    Packs/day: 1.50    Years: 20.00    Pack years: 30.00    Types: Cigarettes    Last attempt to quit: 11/29/2015    Years since quitting: 3.0  . Smokeless tobacco: Never Used  . Tobacco comment: quit June of 2008 after [employer]. he was exposed to secondary smoke  Substance and Sexual Activity  . Alcohol use: Yes    Alcohol/week: 0.0 standard drinks    Comment: occasional  . Drug use: No  . Sexual activity: Not on file  Lifestyle  . Physical activity:    Days per week: Not on file    Minutes per session: Not on file  .  Stress: Not on file  Relationships  . Social connections:    Talks on phone: Not on file    Gets together: Not on file    Attends religious service: Not on file    Active member of club or organization: Not on file    Attends meetings of clubs or organizations: Not on file    Relationship status: Not on file  Other Topics Concern  . Not on file  Social History Narrative  . Not on file    Past Medical History:  Diagnosis Date  . Allergy   . Hypercholesterolemia   . Hypertension   . 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 Polycythemia   . Sleep apnea      Patient Active Problem List   Diagnosis Date Noted  . Prediabetes 12/20/2017  . History of adenomatous  polyp of colon 10/23/2017  . Encounter for screening colonoscopy 06/28/2017  . Dysuria 10/01/2016  . Essential hypertension 10/01/2016  . BMI 36.0-36.9,adult 03/15/2016  . Obesity 03/15/2016  . Baker cyst 04/29/2015  . Fecal occult blood test positive 04/29/2015  . Apnea, sleep 04/29/2015  . Hay fever 02/17/2009  . Benign prostatic hyperplasia 02/17/2009  . ED (erectile dysfunction) of organic origin 04/21/2007  . Compulsive tobacco user syndrome 04/21/2007  . Essential (primary) hypertension 04/19/2007  . Acquired polycythemia 04/19/2007  . Hypercholesterolemia without hypertriglyceridemia 05/19/2006    Past Surgical History:  Procedure Laterality Date  . CATARACT EXTRACTION Right 11/2013  . COLONOSCOPY  2006  . COLONOSCOPY WITH PROPOFOL N/A 10/19/2017   Procedure: COLONOSCOPY WITH PROPOFOL;  Surgeon: [name], MD;  Location: [facility] ENDOSCOPY;  Service: Endoscopy;  Laterality: N/A;  . INGUINAL HERNIA REPAIR Right 1972  . MANDIBLE SURGERY  2005   [facility]    Family History        Family Status  Relation Name Status  . Mother  Deceased  . Father  Deceased  . Sister  Alive  . Brother  Alive  . Brother  Alive  . Brother  Alive        His family history includes Congestive Heart Failure in his mother; Diabetes in his mother; Healthy in his brother, brother, brother, and sister; Pancreatic cancer in his father.      Allergies  Allergen Reactions  . Penicillins Other (See Comments)    unknown     Current Outpatient Medications:  .  aspirin 81 MG tablet, Take 81 mg by mouth daily., Disp: , Rfl:  .  Coenzyme Q10 50 MG CAPS, CO ENZYME Q-10, 50MG  (Oral Capsule)  1 po qd for 0 days  Quantity: 30.00;  Refills: 0   Ordered :31-Aug-2010  Margarita Rana MD;  Started 31-Mar-2009 Active Comments: DX: 272.0, Disp: , Rfl:  .  lisinopril-hydrochlorothiazide (PRINZIDE,ZESTORETIC) 20-12.5 MG tablet, Take 1 tablet by mouth daily., Disp: 90 tablet, Rfl: 0 .  metoprolol tartrate  (LOPRESSOR) 50 MG tablet, Take 1 tablet (50 mg total) by mouth 2 (two) times daily., Disp: 180 tablet, Rfl: 3 .  montelukast (SINGULAIR) 10 MG tablet, TAKE 1 TABLET BY MOUTH DAILY GENERIC EQUIVALENT FOR SINGULAIR, Disp: 90 tablet, Rfl: 1 .  OMEGA-3 FATTY ACIDS PO, FISH-EPA, 1000MG  (Oral Capsule)  2 po bid for 0 days  Quantity: 0.00;  Refills: 0   Ordered :31-Aug-2010  Margarita Rana MD;  Started 19-June-2007 Active Comments: DX: 272.0, Disp: , Rfl:  .  sildenafil (VIAGRA) 50 MG tablet, Take 1 tablet (50 mg  total) by mouth as needed for erectile dysfunction. Reported on 03/15/2016, Disp: 8 tablet, Rfl: 3 .  simvastatin (ZOCOR) 20 MG tablet, Take 1 tablet (20 mg total) by mouth at bedtime., Disp: 90 tablet, Rfl: 3 .  tamsulosin (FLOMAX) 0.4 MG CAPS capsule, Take 2 capsules (0.8 mg total) by mouth daily., Disp: 180 capsule, Rfl: 0   Patient Care Team: Paulene Floor as PCP - General (Physician Assistant) Trinna Post, PA-C as Physician Assistant (Physician Assistant) [name], MD (General Surgery)    Objective:    Vitals: BP 129/84 (BP Location: Left Arm, Patient Position: Sitting, Cuff Size: Normal)   Pulse 83   Temp 97.9 F (36.6 C) (Oral)   Resp 16   Ht 6\' 1"  (1.854 m)   Wt 285 lb 9.6 oz (129.5 kg)   BMI 37.68 kg/m    Vitals:   01/03/19 0809  BP: 129/84  Pulse: 83  Resp: 16  Temp: 97.9 F (36.6 C)  TempSrc: Oral  Weight: 285 lb 9.6 oz (129.5 kg)  Height: 6\' 1"  (1.854 m)     Physical Exam Vitals signs and nursing note reviewed.  Constitutional:      Appearance: Normal appearance. He is obese.  HENT:     Right Ear: Tympanic membrane and ear canal normal.     Left Ear: Tympanic membrane and ear canal normal.     Mouth/Throat:     Mouth: Mucous membranes are moist.     Pharynx: Oropharynx is clear.  Eyes:     Conjunctiva/sclera: Conjunctivae normal.  Cardiovascular:     Rate and Rhythm: Normal rate and regular rhythm.     Pulses: Normal pulses.      Heart sounds: Normal heart sounds.  Pulmonary:     Effort: Pulmonary effort is normal.     Breath sounds: Normal breath sounds.  Abdominal:     General: Abdomen is flat. Bowel sounds are normal.  Skin:    General: Skin is warm and dry.  Neurological:     General: No focal deficit present.     Mental Status: He is alert and oriented to person, place, and time.  Psychiatric:        Mood and Affect: Mood normal.        Behavior: Behavior normal.      Depression Screen PHQ 2/9 Scores 01/03/2019 06/07/2017 03/08/2017  PHQ - 2 Score 0 0 0  PHQ- 9 Score 0 0 0       Assessment & Plan:     Routine Health Maintenance and Physical Exam  Exercise Activities and Dietary recommendations Goals   None     Immunization History  Administered Date(s) Administered  . Influenza,inj,Quad PF,6+ Mos 10/01/2016, 09/05/2017  . Influenza-Unspecified 09/22/2015  . Pneumococcal Polysaccharide-23 01/15/2015  . Td 06/07/2017  . Tdap 04/19/2007  . Zoster Recombinat (Shingrix) 12/20/2017, 07/14/2018    Health Maintenance  Topic Date Due  . Hepatitis C Screening  09-16-1956  . HIV Screening  12/17/1968  . INFLUENZA VACCINE  06/29/2018  . PNA vac Low Risk Adult (1 of 2 - PCV13) 12/17/2018  . TETANUS/TDAP  06/08/2027  . COLONOSCOPY  10/20/2027     Discussed health benefits of physical activity, and encouraged him to engage in regular exercise appropriate for his age and condition.   1. Annual physical exam  - TSH  2. Need for pneumococcal vaccination  - Pneumococcal conjugate vaccine 13-valent IM  3. Essential hypertension  - CBC with Differential/Platelet -  Comprehensive metabolic panel - TSH  4. Prediabetes  - Hemoglobin A1c - TSH  5. Hypercholesterolemia without hypertriglyceridemia  - Lipid Panel With LDL/HDL Ratio  6. Prostate cancer screening  - PSA  7. Need for hepatitis C screening test  - Hepatitis C antibody  8. Screening for HIV (human immunodeficiency  virus)  - HIV Antibody (routine testing w rflx)   Return in about 6 months (around 07/04/2019).  The entirety of the information documented in the History of Present Illness, Review of Systems and Physical Exam were personally obtained by me. Portions of this information were initially documented by Lynford Humphrey, CMA and reviewed by me for thoroughness and accuracy.   --------------------------------------------------------------------    Trinna Post, PA-C  [facility]

## 2019-01-04 LAB — CBC WITH DIFFERENTIAL/PLATELET
Basophils Absolute: 0.1 10*3/uL (ref 0.0–0.2)
Basos: 1 %
EOS (ABSOLUTE): 0.1 10*3/uL (ref 0.0–0.4)
Eos: 1 %
Hematocrit: 50.1 % (ref 37.5–51.0)
Hemoglobin: 14.6 g/dL (ref 13.0–17.7)
Immature Grans (Abs): 0.1 10*3/uL (ref 0.0–0.1)
Immature Granulocytes: 1 %
Lymphocytes Absolute: 1.4 10*3/uL (ref 0.7–3.1)
Lymphs: 15 %
MCH: 21.7 pg — ABNORMAL LOW (ref 26.6–33.0)
MCHC: 29.1 g/dL — ABNORMAL LOW (ref 31.5–35.7)
MCV: 75 fL — ABNORMAL LOW (ref 79–97)
Monocytes Absolute: 0.8 10*3/uL (ref 0.1–0.9)
Monocytes: 9 %
Neutrophils Absolute: 6.4 10*3/uL (ref 1.4–7.0)
Neutrophils: 73 %
Platelets: 209 10*3/uL (ref 150–450)
RBC: 6.72 x10E6/uL — ABNORMAL HIGH (ref 4.14–5.80)
RDW: 18.8 % — ABNORMAL HIGH (ref 11.6–15.4)
WBC: 8.8 10*3/uL (ref 3.4–10.8)

## 2019-01-04 LAB — HEMOGLOBIN A1C
Est. average glucose Bld gHb Est-mCnc: 128 mg/dL
Hgb A1c MFr Bld: 6.1 % — ABNORMAL HIGH (ref 4.8–5.6)

## 2019-01-04 LAB — COMPREHENSIVE METABOLIC PANEL
ALT: 19 IU/L (ref 0–44)
AST: 21 IU/L (ref 0–40)
Albumin/Globulin Ratio: 1.9 (ref 1.2–2.2)
Albumin: 4.4 g/dL (ref 3.8–4.8)
Alkaline Phosphatase: 54 IU/L (ref 39–117)
BUN/Creatinine Ratio: 18 (ref 10–24)
BUN: 18 mg/dL (ref 8–27)
Bilirubin Total: 0.4 mg/dL (ref 0.0–1.2)
CO2: 24 mmol/L (ref 20–29)
Calcium: 9.6 mg/dL (ref 8.6–10.2)
Chloride: 99 mmol/L (ref 96–106)
Creatinine, Ser: 1.01 mg/dL (ref 0.76–1.27)
GFR calc Af Amer: 90 mL/min/{1.73_m2} (ref >59)
GFR calc non Af Amer: 78 mL/min/{1.73_m2} (ref >59)
Globulin, Total: 2.3 g/dL (ref 1.5–4.5)
Glucose: 112 mg/dL — ABNORMAL HIGH (ref 65–99)
Potassium: 4.8 mmol/L (ref 3.5–5.2)
Sodium: 140 mmol/L (ref 134–144)
Total Protein: 6.7 g/dL (ref 6.0–8.5)

## 2019-01-04 LAB — LIPID PANEL WITH LDL/HDL RATIO
Cholesterol, Total: 135 mg/dL (ref 100–199)
HDL: 34 mg/dL — ABNORMAL LOW (ref >39)
LDL Calculated: 84 mg/dL (ref 0–99)
LDl/HDL Ratio: 2.5 ratio (ref 0.0–3.6)
Triglycerides: 85 mg/dL (ref 0–149)
VLDL Cholesterol Cal: 17 mg/dL (ref 5–40)

## 2019-01-04 LAB — PSA: Prostate Specific Ag, Serum: 1.4 ng/mL (ref 0.0–4.0)

## 2019-01-04 LAB — TSH: TSH: 1.48 u[IU]/mL (ref 0.450–4.500)

## 2019-01-04 LAB — HEPATITIS C ANTIBODY: Hep C Virus Ab: <0.1 s/co ratio (ref 0.0–0.9)

## 2019-01-04 LAB — HIV ANTIBODY (ROUTINE TESTING W REFLEX): HIV Screen 4th Generation wRfx: NONREACTIVE

## 2019-01-04 NOTE — Patient Instructions (Signed)

## 2019-01-05 ENCOUNTER — Other Ambulatory Visit: Payer: BLUE CROSS/BLUE SHIELD

## 2019-01-08 ENCOUNTER — Telehealth: Payer: Self-pay

## 2019-01-08 NOTE — Telephone Encounter (Signed)
Patient has viewed results on Mychart 01/05/2019 5:53 pm. sd

## 2019-01-08 NOTE — Telephone Encounter (Signed)
-----   Message from Trinna Post, [place] sent at 01/05/2019  3:34 PM EST ----- Labs stable. A1c up from last time 6.1% currently. Continue with dietary and lifestyle modification. Cholesterol well controlled.

## 2019-01-09 ENCOUNTER — Inpatient Hospital Stay: Payer: BLUE CROSS/BLUE SHIELD | Attending: Hematology and Oncology

## 2019-01-09 ENCOUNTER — Inpatient Hospital Stay: Payer: BLUE CROSS/BLUE SHIELD

## 2019-01-09 VITALS — BP 116/82 | HR 81 | Temp 97.1°F | Resp 18

## 2019-01-09 DIAGNOSIS — D751 Secondary polycythemia: Secondary | ICD-10-CM | POA: Diagnosis not present

## 2019-01-09 LAB — HEMATOCRIT: HCT: 49.6 % (ref 39.0–52.0)

## 2019-02-05 ENCOUNTER — Telehealth: Payer: Self-pay | Admitting: *Deleted

## 2019-02-05 NOTE — Telephone Encounter (Signed)
Received referral for low dose lung cancer screening CT scan. Message left at phone number listed in EMR for patient to call me back to facilitate scheduling scan.  

## 2019-02-05 NOTE — Telephone Encounter (Signed)
Received referral for initial lung cancer screening scan. Contacted patient who reports he is not interested in screening at this time.

## 2019-02-09 ENCOUNTER — Inpatient Hospital Stay: Payer: BLUE CROSS/BLUE SHIELD

## 2019-02-09 ENCOUNTER — Inpatient Hospital Stay: Payer: BLUE CROSS/BLUE SHIELD | Attending: Hematology and Oncology

## 2019-02-09 ENCOUNTER — Other Ambulatory Visit: Payer: Self-pay

## 2019-02-09 DIAGNOSIS — D751 Secondary polycythemia: Secondary | ICD-10-CM

## 2019-02-09 LAB — HEMATOCRIT: HCT: 47.5 % (ref 39.0–52.0)

## 2019-02-21 ENCOUNTER — Other Ambulatory Visit: Payer: Self-pay

## 2019-02-21 DIAGNOSIS — J301 Allergic rhinitis due to pollen: Secondary | ICD-10-CM

## 2019-02-21 DIAGNOSIS — I10 Essential (primary) hypertension: Secondary | ICD-10-CM

## 2019-02-21 DIAGNOSIS — N400 Enlarged prostate without lower urinary tract symptoms: Secondary | ICD-10-CM

## 2019-02-21 DIAGNOSIS — E7800 Pure hypercholesterolemia, unspecified: Secondary | ICD-10-CM

## 2019-02-21 MED ORDER — MONTELUKAST SODIUM 10 MG PO TABS: ORAL_TABLET | ORAL | 3 refills | Status: DC

## 2019-02-21 MED ORDER — TAMSULOSIN HCL 0.4 MG PO CAPS: 0.8000 mg | ORAL_CAPSULE | Freq: Every day | ORAL | 3 refills | Status: DC

## 2019-02-21 MED ORDER — LISINOPRIL-HYDROCHLOROTHIAZIDE 20-12.5 MG PO TABS: 1.0000 | ORAL_TABLET | Freq: Every day | ORAL | 3 refills | Status: DC

## 2019-02-21 MED ORDER — METOPROLOL TARTRATE 50 MG PO TABS: 50.0000 mg | ORAL_TABLET | Freq: Two times a day (BID) | ORAL | 3 refills | Status: DC

## 2019-02-21 MED ORDER — SIMVASTATIN 20 MG PO TABS: 20.0000 mg | ORAL_TABLET | Freq: Every day | ORAL | 3 refills | Status: DC

## 2019-03-08 ENCOUNTER — Other Ambulatory Visit: Payer: Self-pay

## 2019-03-09 ENCOUNTER — Inpatient Hospital Stay: Payer: BLUE CROSS/BLUE SHIELD | Attending: Hematology and Oncology

## 2019-03-09 ENCOUNTER — Other Ambulatory Visit: Payer: Self-pay

## 2019-03-09 ENCOUNTER — Inpatient Hospital Stay: Payer: BLUE CROSS/BLUE SHIELD

## 2019-03-09 DIAGNOSIS — D751 Secondary polycythemia: Secondary | ICD-10-CM

## 2019-03-09 LAB — HEMATOCRIT: HCT: 52.7 % — ABNORMAL HIGH (ref 39.0–52.0)

## 2019-04-13 ENCOUNTER — Inpatient Hospital Stay: Payer: BLUE CROSS/BLUE SHIELD

## 2019-04-17 ENCOUNTER — Inpatient Hospital Stay: Payer: BLUE CROSS/BLUE SHIELD

## 2019-04-20 ENCOUNTER — Inpatient Hospital Stay: Payer: BC Managed Care – PPO | Attending: Hematology and Oncology

## 2019-04-20 ENCOUNTER — Other Ambulatory Visit: Payer: Self-pay

## 2019-04-20 ENCOUNTER — Inpatient Hospital Stay: Payer: BC Managed Care – PPO

## 2019-04-20 DIAGNOSIS — D751 Secondary polycythemia: Secondary | ICD-10-CM | POA: Diagnosis not present

## 2019-04-20 LAB — HEMATOCRIT: HCT: 49.3 % (ref 39.0–52.0)

## 2019-05-11 ENCOUNTER — Other Ambulatory Visit: Payer: BLUE CROSS/BLUE SHIELD

## 2019-05-11 ENCOUNTER — Ambulatory Visit: Payer: BLUE CROSS/BLUE SHIELD | Admitting: Hematology and Oncology

## 2019-05-15 ENCOUNTER — Inpatient Hospital Stay: Payer: BC Managed Care – PPO

## 2019-05-15 ENCOUNTER — Inpatient Hospital Stay: Payer: BC Managed Care – PPO | Attending: Hematology and Oncology

## 2019-05-15 ENCOUNTER — Other Ambulatory Visit: Payer: Self-pay

## 2019-05-15 DIAGNOSIS — D751 Secondary polycythemia: Secondary | ICD-10-CM

## 2019-05-15 LAB — CBC WITH DIFFERENTIAL/PLATELET
Abs Immature Granulocytes: 0.1 10*3/uL — ABNORMAL HIGH (ref 0.00–0.07)
Basophils Absolute: 0.1 10*3/uL (ref 0.0–0.1)
Basophils Relative: 1 %
Eosinophils Absolute: 0.1 10*3/uL (ref 0.0–0.5)
Eosinophils Relative: 1 %
HCT: 49.4 % (ref 39.0–52.0)
Hemoglobin: 14.3 g/dL (ref 13.0–17.0)
Immature Granulocytes: 1 %
Lymphocytes Relative: 11 %
Lymphs Abs: 0.9 10*3/uL (ref 0.7–4.0)
MCH: 21.3 pg — ABNORMAL LOW (ref 26.0–34.0)
MCHC: 28.9 g/dL — ABNORMAL LOW (ref 30.0–36.0)
MCV: 73.6 fL — ABNORMAL LOW (ref 80.0–100.0)
Monocytes Absolute: 0.8 10*3/uL (ref 0.1–1.0)
Monocytes Relative: 11 %
Neutro Abs: 6 10*3/uL (ref 1.7–7.7)
Neutrophils Relative %: 75 %
Platelets: 171 10*3/uL (ref 150–400)
RBC: 6.71 MIL/uL — ABNORMAL HIGH (ref 4.22–5.81)
RDW: 20.2 % — ABNORMAL HIGH (ref 11.5–15.5)
WBC: 8 10*3/uL (ref 4.0–10.5)
nRBC: 0 % (ref 0.0–0.2)

## 2019-05-15 LAB — FERRITIN: Ferritin: 6 ng/mL — ABNORMAL LOW (ref 24–336)

## 2019-05-15 NOTE — Progress Notes (Signed)
[facility]  [address], [address] [place], [place] [address] Phone: [phone]  Fax: [phone]   Telephone Visit:  05/16/2019  Referring physician: Trinna Post, PA-C  I connected with Jason Bolton on 05/16/2019 at 2:36 PM by telephone and verified that I was speaking with the correct person using 2 identifiers.  The patient was at work.  I discussed the limitations, risk, security and privacy concerns of performing an evaluation and management service by telephone and the availability of in person appointments.  I also discussed with the patient that there may be a patient responsible charge related to this service.  The patient expressed understanding and agreed to proceed.   Chief Complaint: Jason Bolton is a 63 y.o. 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  HPI: The patient was last seen in the hematology clinic on 11/10/2018. At that time, he felt "fine".  He used his CPAP.  He continued to smoke.  Exam was stable. Hematocrit was 52.8 and hemoglobin 15.3. He underwent a phlebotomy.  He had monthly phlebotomies on 11/10/2018, 12/08/2018, 01/09/2019, 03/09/2019, 04/20/2019, and 05/15/2019.   He was contacted on 02/05/2019 about the low dose lung cancer screening CT program. He was not interested at that time due to recently having undergone several procedures and tests.   During the interim, the patient reports he is feeling better. He attributes this to increased exercise and altering his diet. He denies any new concerns. He has been smoking 10-12 cigarettes per day due to stress at work.    Past Medical History:  Diagnosis Date  . Allergy   . Hypercholesterolemia   . Hypertension   . 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 Polycythemia   . Sleep apnea     Past Surgical History:  Procedure Laterality Date  . CATARACT EXTRACTION Right 11/2013  . COLONOSCOPY  2006  .  COLONOSCOPY WITH PROPOFOL N/A 10/19/2017   Procedure: COLONOSCOPY WITH PROPOFOL;  Surgeon: [name], MD;  Location: [facility];  Service: Endoscopy;  Laterality: N/A;  . INGUINAL HERNIA REPAIR Right 1972  . MANDIBLE SURGERY  2005   [facility]    Family History  Problem Relation Age of Onset  . Diabetes Mother   . Congestive Heart Failure Mother   . Pancreatic cancer Father   . Healthy Sister   . Healthy Brother   . Healthy Brother   . Healthy Brother     Social History:  reports that he quit smoking about 3 years ago. His smoking use included cigarettes. He has a 30.00 pack-year smoking history. He has never used smokeless tobacco. He reports current alcohol use. He reports that he does not use drugs. He stopped smoking 2 years ago.  Patient is smoking 10-12 cigarettes again at this point.  He has not had any exposure to formaldehyde in 20 years.  He runs 2 funeral homes.  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  Participants in the patient's visit and their role in the encounter included the patient and Waymon Budge, RN today.  The intake visit was provided by Waymon Budge, RN.  Allergies:  Allergies  Allergen Reactions  . Penicillins Other (See Comments)    unknown    Current Medications: Current Outpatient Medications  Medication Sig Dispense Refill  . aspirin 81 MG tablet Take 81 mg by mouth daily.    Marland Kitchen atenolol (TENORMIN) 25 MG tablet Take 25 mg by  mouth 2 (two) times daily.    . Coenzyme Q10 50 MG CAPS CO ENZYME Q-10, 50MG  (Oral Capsule)  1 po qd for 0 days  Quantity: 30.00;  Refills: 0   Ordered :31-Aug-2010  Margarita Rana MD;  Started 31-Mar-2009 Active Comments: DX: 272.0    . lisinopril-hydrochlorothiazide (PRINZIDE,ZESTORETIC) 20-12.5 MG tablet Take 1 tablet by mouth daily. 90 tablet 3  . metoprolol tartrate (LOPRESSOR) 50 MG tablet Take 1 tablet (50 mg total) by mouth 2 (two) times daily. 180 tablet 3  . montelukast (SINGULAIR) 10 MG tablet TAKE 1 TABLET BY  MOUTH DAILY GENERIC EQUIVALENT FOR SINGULAIR 90 tablet 3  . naproxen (NAPROSYN) 500 MG tablet Take 500 mg by mouth daily as needed.     . OMEGA-3 FATTY ACIDS PO Take 1 tablet by mouth daily. FISH-EPA, 1000MG  (Oral Capsule)  2 po bid for 0 days  Quantity: 0.00;  Refills: 0   Ordered :31-Aug-2010  Margarita Rana MD;  Started 19-June-2007 Active Comments: DX: 272.0    . sildenafil (VIAGRA) 50 MG tablet Take 1 tablet (50 mg total) by mouth as needed for erectile dysfunction. Reported on 03/15/2016 8 tablet 3  . simvastatin (ZOCOR) 20 MG tablet Take 1 tablet (20 mg total) by mouth at bedtime. 90 tablet 3  . tamsulosin (FLOMAX) 0.4 MG CAPS capsule Take 2 capsules (0.8 mg total) by mouth daily. 180 capsule 3   No current facility-administered medications for this visit.     Review of Systems  Constitutional: Negative for chills, diaphoresis, fever, malaise/fatigue and weight loss.       Feels better.  HENT: Negative for congestion, ear discharge, ear pain, nosebleeds, sinus pain and sore throat.   Eyes: Negative.  Negative for blurred vision, double vision, photophobia and pain.  Respiratory: Negative for cough, hemoptysis, sputum production and shortness of breath (exertional).        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  Negative for chest pain, palpitations, orthopnea, leg swelling and PND.  Gastrointestinal: Negative for abdominal pain, blood in stool, constipation, diarrhea, melena, nausea and vomiting.  Genitourinary: Negative.  Negative for frequency, hematuria and urgency.       No testosterone use.  Musculoskeletal: Negative.  Negative for back pain, falls, joint pain, myalgias and neck pain.  Skin: Negative.  Negative for itching and rash.  Neurological: Negative.  Negative for dizziness, tremors, sensory change, speech change, focal weakness, weakness and headaches.  Endo/Heme/Allergies: Negative.  Does not bruise/bleed easily.  Psychiatric/Behavioral:  Negative.  Negative for depression and memory loss. The patient is not nervous/anxious and does not have insomnia.        Stress at work.  All other systems reviewed and are negative.   Performance status (ECOG): 0   Appointment on 05/15/2019  Component Date Value Ref Range Status  . Ferritin 05/15/2019 6* 24 - 336 ng/mL Final   Performed at [facility], [address] [place], [place] [address]  . WBC 05/15/2019 8.0  4.0 - 10.5 K/uL Final  . RBC 05/15/2019 6.71* 4.22 - 5.81 MIL/uL Final  . Hemoglobin 05/15/2019 14.3  13.0 - 17.0 g/dL Final  . HCT 05/15/2019 49.4  39.0 - 52.0 % Final  . MCV 05/15/2019 73.6* 80.0 - 100.0 fL Final  . MCH 05/15/2019 21.3* 26.0 - 34.0 pg Final  . MCHC 05/15/2019 28.9* 30.0 - 36.0 g/dL Final  . RDW 05/15/2019 20.2* 11.5 - 15.5 % Final  . Platelets 05/15/2019 171  150 -  400 K/uL Final  . nRBC 05/15/2019 0.0  0.0 - 0.2 % Final  . Neutrophils Relative % 05/15/2019 75  % Final  . Neutro Abs 05/15/2019 6.0  1.7 - 7.7 K/uL Final  . Lymphocytes Relative 05/15/2019 11  % Final  . Lymphs Abs 05/15/2019 0.9  0.7 - 4.0 K/uL Final  . Monocytes Relative 05/15/2019 11  % Final  . Monocytes Absolute 05/15/2019 0.8  0.1 - 1.0 K/uL Final  . Eosinophils Relative 05/15/2019 1  % Final  . Eosinophils Absolute 05/15/2019 0.1  0.0 - 0.5 K/uL Final  . Basophils Relative 05/15/2019 1  % Final  . Basophils Absolute 05/15/2019 0.1  0.0 - 0.1 K/uL Final  . Immature Granulocytes 05/15/2019 1  % Final  . Abs Immature Granulocytes 05/15/2019 0.10* 0.00 - 0.07 K/uL Final   Performed at [facility], [address], [place], [place] [address]    Assessment:  KAEO JACOME is a 63 y.o. 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   Etiology is felt secondary to sleep apnea.  He uses CPAP.  He has a 30 pack year smoking history.  He stopped smoking in 2016.  He does not use testosterone.  Initial labs ruled out polycythemia rubra vera. JAK2 V617F and  exon 12 were negative on 10/17/2017.  Erythropoietin level was normal.  He is on a baby aspirin.  He undergoes phlebotomy to keep his hematocrit < 48.  Last phlebotomy was on 05/15/2019.    Ferritin has been followed: 6 on 05/12/2017 5 on 10/17/2017, 6 on 11/16/2017, 12 on 05/02/2018, 6 on 11/10/2018, and 6 on 05/15/2019.  Epo level was 25.3 on 10/17/2017, 34.6 on 05/02/2018, and 34.3 on 11/10/2018.  Symptomatically, he is doing well.  He denies any concerns.    Plan: 1.   Labs today: CBC with diff, ferritin, BCR-ABL. 2.   Secondary polycythemia             Hematocrit 49.4.  Hemoglobin 14.3.    Hematocrit goal is < 48.    He underwent phlebotomy yesterday.    Etiology felt secondary to sleep apnea and smoking.   3.   Elevated erythropoietin level             Epo level is slightly elevated in secondary polycythemia.             Epo level can be elevated in response to hypoxia or erythropoietin secreting tumor.             The degree of elevation does not reveal the underlying cause. 4.   RTC monthly for CBC +/- phlebotomy. 5.   RTC in 6 months for MD assessment, labs (CBC with diff, ferritin) and +/- phlebotomy.  I discussed the assessment and treatment plan with the patient.  The patient was provided an opportunity to ask questions and all were answered.  The patient agreed with the plan and demonstrated an understanding of the instructions.  The patient was advised to call back or seek an in person evaluation if the symptoms worsen or if the condition fails to improve as anticipated.  I provided 19 minutes (2:36 PM - 2:55 PM) of non face-to-face telephone visit time during this this encounter and > 50% was spent counseling as documented under my assessment and plan.  I provided these services from the [facility] office.   Nolon Stalls, MD, PhD  05/16/2019, 2:36 PM  I, Molly Dorshimer, am acting as Education administrator for [employer]. Mike Gip, MD,  PhD.  I, Melissa C. Mike Gip, MD, have reviewed  the above documentation for accuracy and completeness, and I agree with the above.

## 2019-05-15 NOTE — Patient Instructions (Signed)

## 2019-05-16 ENCOUNTER — Ambulatory Visit: Payer: Self-pay | Admitting: Hematology and Oncology

## 2019-05-16 ENCOUNTER — Other Ambulatory Visit: Payer: BC Managed Care – PPO

## 2019-05-16 ENCOUNTER — Inpatient Hospital Stay (HOSPITAL_BASED_OUTPATIENT_CLINIC_OR_DEPARTMENT_OTHER): Payer: BC Managed Care – PPO | Admitting: Hematology and Oncology

## 2019-05-16 ENCOUNTER — Encounter: Payer: Self-pay | Admitting: Hematology and Oncology

## 2019-05-16 DIAGNOSIS — Z79899 Other long term (current) drug therapy: Secondary | ICD-10-CM | POA: Diagnosis not present

## 2019-05-16 DIAGNOSIS — D751 Secondary polycythemia: Secondary | ICD-10-CM | POA: Diagnosis not present

## 2019-05-16 DIAGNOSIS — Z87891 Personal history of nicotine dependence: Secondary | ICD-10-CM

## 2019-05-16 DIAGNOSIS — Z7982 Long term (current) use of aspirin: Secondary | ICD-10-CM | POA: Diagnosis not present

## 2019-05-16 NOTE — Progress Notes (Signed)
Confirmed Name, DOB, and Address. Denies any concerns at this time.  

## 2019-05-22 LAB — BCR-ABL1 FISH
Cells Analyzed: 200
Cells Counted: 200

## 2019-06-13 ENCOUNTER — Inpatient Hospital Stay: Payer: BC Managed Care – PPO

## 2019-07-10 ENCOUNTER — Other Ambulatory Visit: Payer: Self-pay

## 2019-07-11 ENCOUNTER — Telehealth: Payer: Self-pay | Admitting: Physician Assistant

## 2019-07-11 ENCOUNTER — Inpatient Hospital Stay: Payer: BC Managed Care – PPO | Attending: Hematology and Oncology

## 2019-07-11 ENCOUNTER — Inpatient Hospital Stay: Payer: BC Managed Care – PPO

## 2019-07-11 ENCOUNTER — Ambulatory Visit: Payer: BLUE CROSS/BLUE SHIELD | Admitting: Physician Assistant

## 2019-07-11 ENCOUNTER — Other Ambulatory Visit: Payer: Self-pay

## 2019-07-11 ENCOUNTER — Encounter: Payer: Self-pay | Admitting: Physician Assistant

## 2019-07-11 VITALS — BP 147/90 | HR 82 | Resp 18

## 2019-07-11 VITALS — BP 142/90 | HR 71 | Temp 98.4°F | Resp 16 | Wt 290.0 lb

## 2019-07-11 DIAGNOSIS — I10 Essential (primary) hypertension: Secondary | ICD-10-CM | POA: Diagnosis not present

## 2019-07-11 DIAGNOSIS — R7303 Prediabetes: Secondary | ICD-10-CM | POA: Diagnosis not present

## 2019-07-11 DIAGNOSIS — D751 Secondary polycythemia: Secondary | ICD-10-CM

## 2019-07-11 DIAGNOSIS — G4739 Other sleep apnea: Secondary | ICD-10-CM

## 2019-07-11 DIAGNOSIS — Z79899 Other long term (current) drug therapy: Secondary | ICD-10-CM | POA: Diagnosis not present

## 2019-07-11 DIAGNOSIS — E7800 Pure hypercholesterolemia, unspecified: Secondary | ICD-10-CM

## 2019-07-11 LAB — CBC
HCT: 48.5 % (ref 39.0–52.0)
Hemoglobin: 14.1 g/dL (ref 13.0–17.0)
MCH: 20.9 pg — ABNORMAL LOW (ref 26.0–34.0)
MCHC: 29.1 g/dL — ABNORMAL LOW (ref 30.0–36.0)
MCV: 72 fL — ABNORMAL LOW (ref 80.0–100.0)
Platelets: 170 10*3/uL (ref 150–400)
RBC: 6.74 MIL/uL — ABNORMAL HIGH (ref 4.22–5.81)
RDW: 19.8 % — ABNORMAL HIGH (ref 11.5–15.5)
WBC: 10.5 10*3/uL (ref 4.0–10.5)
nRBC: 0 % (ref 0.0–0.2)

## 2019-07-11 NOTE — Progress Notes (Signed)
Patient: Jason Bolton Male    DOB: Feb 06, 1956   63 y.o.   MRN: [id number] Visit Date: 07/11/2019  Today's Provider: Trinna Post, PA-C   Chief Complaint  Patient presents with  . Diabetes  . Hyperlipidemia  . Hypertension   Subjective:     HPI   In the interim, wife has broken her shoulder after gardening. She was digging up a rose bush and lost her footing. He has been caring for her during this time.    Pre-Diabetes Follow-up:   Lab Results  Component Value Date   HGBA1C 6.1 (H) 01/03/2019   HGBA1C 5.7 (A) 07/14/2018   HGBA1C 6.0 (H) 06/07/2017   Last seen for diabetes 6 months ago.  Management since then includes none. Current symptoms include none and  Most Recent Eye Exam: >1year Weight trend: stable Prior visit with dietician: no Current diet: working to improve Current exercise: none  ------------------------------------------------------------------------   Hypertension, follow-up:  BP Readings from Last 3 Encounters:  07/11/19 (!) 142/90  05/15/19 (!) 142/85  04/20/19 129/88    He was last seen for hypertension 6 months ago.  BP at that visit was 124/84. Management since that visit includes none.He reports excellent compliance with treatment. He is not having side effects.  He is not exercising. He is adherent to low salt diet.   Outside blood pressures are stytolic ranging in the 329J and diastolic in the 24Q. He is experiencing none.  Patient denies chest pain, chest pressure/discomfort, claudication, dyspnea, exertional chest pressure/discomfort, fatigue, irregular heart beat, lower extremity edema, near-syncope, orthopnea, palpitations, paroxysmal nocturnal dyspnea, syncope and tachypnea.   Cardiovascular risk factors include advanced age (older than 15 for men, 49 for women), diabetes mellitus, hypertension, male gender and obesity (BMI >= 30 kg/m2).  Use of agents associated with hypertension: none.    ------------------------------------------------------------------------    Lipid/Cholesterol, Follow-up:   Last seen for this 6 months ago.  Management since that visit includes zocor 20 mg daily.  Last Lipid Panel:    Component Value Date/Time   CHOL 135 01/03/2019 0842   TRIG 85 01/03/2019 0842   HDL 34 (L) 01/03/2019 0842   CHOLHDL 4.8 07/14/2018 0902   LDLCALC 84 01/03/2019 0842    He reports excellent compliance with treatment. He is not having side effects.   Wt Readings from Last 3 Encounters:  07/11/19 290 lb (131.5 kg)  01/03/19 285 lb 9.6 oz (129.5 kg)  11/10/18 296 lb (134.3 kg)   Sleep Apnea: Patient was diagnosed with sleep apnea in 2012. He has been using a CPAP since then nightly with significant improvement in his daytime drowsiness and fatigue. Most recently, his CPAP has given a message about his motor life being surpassed and he needs a new machine. He would like the orders faxed to [place].   Tobacco Abuse: decreasee  ------------------------------------------------------------------------  Allergies  Allergen Reactions  . Penicillins Other (See Comments)    unknown     Current Outpatient Medications:  .  aspirin 81 MG tablet, Take 81 mg by mouth daily., Disp: , Rfl:  .  Coenzyme Q10 50 MG CAPS, CO ENZYME Q-10, 50MG  (Oral Capsule)  1 po qd for 0 days  Quantity: 30.00;  Refills: 0   Ordered :31-Aug-2010  Margarita Rana MD;  Started 31-Mar-2009 Active Comments: DX: 272.0, Disp: , Rfl:  .  metoprolol tartrate (LOPRESSOR) 50 MG tablet, Take 1 tablet (50 mg total) by mouth 2 (two) times  daily., Disp: 180 tablet, Rfl: 3 .  montelukast (SINGULAIR) 10 MG tablet, TAKE 1 TABLET BY MOUTH DAILY GENERIC EQUIVALENT FOR SINGULAIR, Disp: 90 tablet, Rfl: 3 .  naproxen (NAPROSYN) 500 MG tablet, Take 500 mg by mouth daily as needed. , Disp: , Rfl:  .  OMEGA-3 FATTY ACIDS PO, Take 1 tablet by mouth daily. FISH-EPA, 1000MG  (Oral Capsule)  2 po bid for 0 days   Quantity: 0.00;  Refills: 0   Ordered :31-Aug-2010  Margarita Rana MD;  Started 19-June-2007 Active Comments: DX: 272.0, Disp: , Rfl:  .  sildenafil (VIAGRA) 50 MG tablet, Take 1 tablet (50 mg total) by mouth as needed for erectile dysfunction. Reported on 03/15/2016, Disp: 8 tablet, Rfl: 3 .  simvastatin (ZOCOR) 20 MG tablet, Take 1 tablet (20 mg total) by mouth at bedtime., Disp: 90 tablet, Rfl: 3 .  tamsulosin (FLOMAX) 0.4 MG CAPS capsule, Take 2 capsules (0.8 mg total) by mouth daily., Disp: 180 capsule, Rfl: 3 .  lisinopril-hydrochlorothiazide (PRINZIDE,ZESTORETIC) 20-12.5 MG tablet, Take 1 tablet by mouth daily., Disp: 90 tablet, Rfl: 3  Review of Systems  Constitutional: Negative.   HENT: Negative.   Respiratory: Negative.   Cardiovascular: Negative.   Gastrointestinal: Positive for abdominal distention.  Endocrine: Negative.   Genitourinary: Negative.   Musculoskeletal: Negative.     Social History   Tobacco Use  . Smoking status: Former Smoker    Packs/day: 1.50    Years: 20.00    Pack years: 30.00    Types: Cigarettes    Quit date: 11/29/2015    Years since quitting: 3.6  . Smokeless tobacco: Never Used  . Tobacco comment: quit June of 2008 after [employer]. he was exposed to secondary smoke  Substance Use Topics  . Alcohol use: Yes    Alcohol/week: 0.0 standard drinks    Comment: occasional      Objective:   BP (!) 142/90   Pulse 71   Temp 98.4 F (36.9 C) (Oral)   Resp 16   Wt 290 lb (131.5 kg)   BMI 38.26 kg/m  Vitals:   07/11/19 0821  BP: (!) 142/90  Pulse: 71  Resp: 16  Temp: 98.4 F (36.9 C)  TempSrc: Oral  Weight: 290 lb (131.5 kg)     Physical Exam Constitutional:      Appearance: Normal appearance.  Cardiovascular:     Rate and Rhythm: Normal rate and regular rhythm.     Heart sounds: Normal heart sounds.  Pulmonary:     Effort: Pulmonary effort is normal.     Breath sounds: Normal breath sounds.  Skin:    General: Skin is warm and dry.   Neurological:     Mental Status: He is alert and oriented to person, place, and time. Mental status is at baseline.  Psychiatric:        Mood and Affect: Mood normal.        Behavior: Behavior normal.      No results found for any visits on 07/11/19.     Assessment & Plan    1. Prediabetes  Labs as below. Follow up in 6 months for CPE and f/u.  - HgB A1c - POCT glycosylated hemoglobin (Hb A1C)  2. Essential hypertension  Continue medications.   - Comprehensive Metabolic Panel (CMET)  3. Other sleep apnea  His CPAP machine is broken. Last sleep study in 2012 positive for sleep apnea.   4. Hypercholesterolemia without hypertriglyceridemia  Continue Zocor 20 mg QD.  Trinna Post, PA-C  [facility]  The entirety of the information documented in the History of Present Illness, Review of Systems and Physical Exam were personally obtained by me. Portions of this information were initially documented by Jennings Books, CMA and reviewed by me for thoroughness and accuracy.

## 2019-07-11 NOTE — Patient Instructions (Signed)

## 2019-07-11 NOTE — Telephone Encounter (Signed)
Patient needs new CPAP. Have written prescription. Please fax along with 2012 sleep study in [place] and my note from today to [facility] in [place], [place].

## 2019-07-11 NOTE — Telephone Encounter (Signed)
Documentation will be faxed. KW

## 2019-07-12 LAB — COMPREHENSIVE METABOLIC PANEL
ALT: 16 IU/L (ref 0–44)
AST: 15 IU/L (ref 0–40)
Albumin/Globulin Ratio: 1.6 (ref 1.2–2.2)
Albumin: 4.1 g/dL (ref 3.8–4.8)
Alkaline Phosphatase: 51 IU/L (ref 39–117)
BUN/Creatinine Ratio: 18 (ref 10–24)
BUN: 17 mg/dL (ref 8–27)
Bilirubin Total: 0.5 mg/dL (ref 0.0–1.2)
CO2: 27 mmol/L (ref 20–29)
Calcium: 9.3 mg/dL (ref 8.6–10.2)
Chloride: 98 mmol/L (ref 96–106)
Creatinine, Ser: 0.92 mg/dL (ref 0.76–1.27)
GFR calc Af Amer: 101 mL/min/{1.73_m2} (ref >59)
GFR calc non Af Amer: 87 mL/min/{1.73_m2} (ref >59)
Globulin, Total: 2.5 g/dL (ref 1.5–4.5)
Glucose: 101 mg/dL — ABNORMAL HIGH (ref 65–99)
Potassium: 4.6 mmol/L (ref 3.5–5.2)
Sodium: 138 mmol/L (ref 134–144)
Total Protein: 6.6 g/dL (ref 6.0–8.5)

## 2019-07-12 LAB — HEMOGLOBIN A1C
Est. average glucose Bld gHb Est-mCnc: 120 mg/dL
Hgb A1c MFr Bld: 5.8 % — ABNORMAL HIGH (ref 4.8–5.6)

## 2019-07-20 NOTE — Telephone Encounter (Signed)
Order was faxed today.

## 2019-07-20 NOTE — Telephone Encounter (Signed)
pt called today saying [facility] was trying to get a cpap machine for him and he has not heard anything back and says that they have not either.  Telephone number for [phone]   FAX number [phone]  Pt's CB# [phone]  [place]

## 2019-07-31 DIAGNOSIS — G4733 Obstructive sleep apnea (adult) (pediatric): Secondary | ICD-10-CM | POA: Diagnosis not present

## 2019-08-08 ENCOUNTER — Inpatient Hospital Stay: Payer: BC Managed Care – PPO | Attending: Hematology and Oncology

## 2019-08-08 ENCOUNTER — Inpatient Hospital Stay: Payer: BC Managed Care – PPO

## 2019-08-30 DIAGNOSIS — G4733 Obstructive sleep apnea (adult) (pediatric): Secondary | ICD-10-CM | POA: Diagnosis not present

## 2019-09-05 ENCOUNTER — Inpatient Hospital Stay: Payer: BC Managed Care – PPO | Attending: Hematology and Oncology

## 2019-09-05 ENCOUNTER — Inpatient Hospital Stay: Payer: BC Managed Care – PPO

## 2019-09-05 DIAGNOSIS — D751 Secondary polycythemia: Secondary | ICD-10-CM | POA: Insufficient documentation

## 2019-09-05 DIAGNOSIS — Z23 Encounter for immunization: Secondary | ICD-10-CM | POA: Insufficient documentation

## 2019-09-14 ENCOUNTER — Encounter: Payer: Self-pay | Admitting: Hematology and Oncology

## 2019-09-19 ENCOUNTER — Inpatient Hospital Stay: Payer: BC Managed Care – PPO

## 2019-09-19 ENCOUNTER — Other Ambulatory Visit: Payer: Self-pay

## 2019-09-19 VITALS — BP 136/88 | HR 65 | Temp 98.0°F | Resp 20

## 2019-09-19 DIAGNOSIS — Z23 Encounter for immunization: Secondary | ICD-10-CM | POA: Diagnosis not present

## 2019-09-19 DIAGNOSIS — D751 Secondary polycythemia: Secondary | ICD-10-CM

## 2019-09-19 LAB — CBC
HCT: 50.4 % (ref 39.0–52.0)
Hemoglobin: 14.1 g/dL (ref 13.0–17.0)
MCH: 20.5 pg — ABNORMAL LOW (ref 26.0–34.0)
MCHC: 28 g/dL — ABNORMAL LOW (ref 30.0–36.0)
MCV: 73.4 fL — ABNORMAL LOW (ref 80.0–100.0)
Platelets: 140 10*3/uL — ABNORMAL LOW (ref 150–400)
RBC: 6.87 MIL/uL — ABNORMAL HIGH (ref 4.22–5.81)
RDW: 20.3 % — ABNORMAL HIGH (ref 11.5–15.5)
WBC: 9 10*3/uL (ref 4.0–10.5)
nRBC: 0 % (ref 0.0–0.2)

## 2019-09-19 MED ORDER — INFLUENZA VAC A&B SA ADJ QUAD 0.5 ML IM PRSY
0.5000 mL | PREFILLED_SYRINGE | Freq: Once | INTRAMUSCULAR | Status: AC
  Administered 2019-09-19: 0.5 mL via INTRAMUSCULAR

## 2019-09-30 DIAGNOSIS — G4733 Obstructive sleep apnea (adult) (pediatric): Secondary | ICD-10-CM | POA: Diagnosis not present

## 2019-10-03 ENCOUNTER — Inpatient Hospital Stay: Payer: BC Managed Care – PPO

## 2019-10-14 ENCOUNTER — Other Ambulatory Visit: Payer: Self-pay | Admitting: Physician Assistant

## 2019-10-14 DIAGNOSIS — N529 Male erectile dysfunction, unspecified: Secondary | ICD-10-CM

## 2019-10-30 DIAGNOSIS — G4733 Obstructive sleep apnea (adult) (pediatric): Secondary | ICD-10-CM | POA: Diagnosis not present

## 2019-10-31 ENCOUNTER — Encounter: Payer: Self-pay | Admitting: Hematology and Oncology

## 2019-10-31 ENCOUNTER — Inpatient Hospital Stay: Payer: BC Managed Care – PPO | Attending: Hematology and Oncology

## 2019-10-31 ENCOUNTER — Other Ambulatory Visit: Payer: Self-pay

## 2019-10-31 DIAGNOSIS — D751 Secondary polycythemia: Secondary | ICD-10-CM | POA: Diagnosis not present

## 2019-10-31 LAB — CBC WITH DIFFERENTIAL/PLATELET
Abs Immature Granulocytes: 0.06 10*3/uL (ref 0.00–0.07)
Basophils Absolute: 0.1 10*3/uL (ref 0.0–0.1)
Basophils Relative: 1 %
Eosinophils Absolute: 0.1 10*3/uL (ref 0.0–0.5)
Eosinophils Relative: 1 %
HCT: 49.1 % (ref 39.0–52.0)
Hemoglobin: 13.4 g/dL (ref 13.0–17.0)
Immature Granulocytes: 1 %
Lymphocytes Relative: 14 %
Lymphs Abs: 1.3 10*3/uL (ref 0.7–4.0)
MCH: 20.4 pg — ABNORMAL LOW (ref 26.0–34.0)
MCHC: 27.3 g/dL — ABNORMAL LOW (ref 30.0–36.0)
MCV: 74.6 fL — ABNORMAL LOW (ref 80.0–100.0)
Monocytes Absolute: 0.8 10*3/uL (ref 0.1–1.0)
Monocytes Relative: 9 %
Neutro Abs: 6.5 10*3/uL (ref 1.7–7.7)
Neutrophils Relative %: 74 %
Platelets: 138 10*3/uL — ABNORMAL LOW (ref 150–400)
RBC: 6.58 MIL/uL — ABNORMAL HIGH (ref 4.22–5.81)
RDW: 19.6 % — ABNORMAL HIGH (ref 11.5–15.5)
WBC: 8.8 10*3/uL (ref 4.0–10.5)
nRBC: 0 % (ref 0.0–0.2)

## 2019-10-31 LAB — FERRITIN: Ferritin: 4 ng/mL — ABNORMAL LOW (ref 24–336)

## 2019-10-31 NOTE — Progress Notes (Signed)
[facility]  [address], [address] [place], [place] [address] Phone: [phone]  Fax: [phone]   Telemedicine Office Visit:  11/01/2019  Referring physician: Trinna Post, PA-C  I connected with Jason Bolton on 11/01/2019 at 3:26 PM by videoconferencing and verified that I was speaking with the correct person using 2 identifiers.  The patient was at work.  I discussed the limitations, risk, security and privacy concerns of performing an evaluation and management service by videoconferencing and the availability of in person appointments.  I also discussed with the patient that there may be a patient responsible charge related to this service.  The patient expressed understanding and agreed to proceed.   Chief Complaint: Jason Bolton is a 63 y.o. 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  HPI: The patient was last seen in the hematology clinic on 05/16/2019. At that time, he was doing well. He denies any concerned. Hematocrit 49.4 (goal < 48). Hemoglobin 14.3. Patient underwent phlebotomy on 05/15/2019.    Labs followed: 07/11/2019: Hematocrit 48.5, hemoglobin 14.1, MCV 72.0, platelets 170,000, WBC 10,500. 09/19/2019: Hematocrit 50.4, hemoglobin 14.1, MCV 73.4, platelets 140,000, WBC 9,000.  10/31/2019: Hematocrit 49.1, hemoglobin 13.4, MCV 74.6, platelets 138,000, WBC 8,800.   He underwent phlebotomy on 07/11/2019 and 09/19/2019.  During the interim, he has felt "fine". After phlebotomies, he feels better. Before the phlebotomies, he describes pressure behind his eyes and a headache.  He denies having a headache today. He has no shortness of breath. He is not using testosterone. He denies any melena or hematochezia. He denies any hematuria. His last colonoscopy was on 10/19/2017; the next colonoscopy is planned in 3 years.   Past Medical History:  Diagnosis Date  . Allergy   . Hypercholesterolemia   . Hypertension    . 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 Polycythemia   . Sleep apnea     Past Surgical History:  Procedure Laterality Date  . CATARACT EXTRACTION Right 11/2013  . COLONOSCOPY  2006  . COLONOSCOPY WITH PROPOFOL N/A 10/19/2017   Procedure: COLONOSCOPY WITH PROPOFOL;  Surgeon: [name], MD;  Location: [facility] ENDOSCOPY;  Service: Endoscopy;  Laterality: N/A;  . INGUINAL HERNIA REPAIR Right 1972  . MANDIBLE SURGERY  2005   [facility]    Family History  Problem Relation Age of Onset  . Diabetes Mother   . Congestive Heart Failure Mother   . Pancreatic cancer Father   . Healthy Sister   . Healthy Brother   . Healthy Brother   . Healthy Brother     Social History:  reports that he quit smoking about 3 years ago. His smoking use included cigarettes. He has a 30.00 pack-year smoking history. He has never used smokeless tobacco. He reports current alcohol use. He reports that he does not use drugs. He stopped smoking 2 years ago. Patient is smoking 10-12 cigarettes again at this point. He has not had any exposure to formaldehyde in 20 years. He runs 2 funeral homes. 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  Participants in the patient's visit and their role in the encounter included the patient Jason Bolton, CMA, today.  The intake visit was provided by Jason Bolton, CMA.  Allergies:  Allergies  Allergen Reactions  . Penicillins Other (See Comments)    unknown    Current Medications: Current Outpatient Medications  Medication Sig Dispense Refill  . aspirin 81 MG tablet Take  81 mg by mouth daily.    . Coenzyme Q10 50 MG CAPS CO ENZYME Q-10, 50MG  (Oral Capsule)  1 po qd for 0 days  Quantity: 30.00;  Refills: 0   Ordered :31-Aug-2010  Margarita Rana MD;  Started 31-Mar-2009 Active Comments: DX: 272.0    . lisinopril-hydrochlorothiazide (PRINZIDE,ZESTORETIC) 20-12.5 MG tablet Take 1 tablet by mouth daily. 90 tablet 3  . metoprolol tartrate  (LOPRESSOR) 50 MG tablet Take 1 tablet (50 mg total) by mouth 2 (two) times daily. 180 tablet 3  . montelukast (SINGULAIR) 10 MG tablet TAKE 1 TABLET BY MOUTH DAILY GENERIC EQUIVALENT FOR SINGULAIR 90 tablet 3  . OMEGA-3 FATTY ACIDS PO Take 1 tablet by mouth daily. FISH-EPA, 1000MG  (Oral Capsule)  2 po bid for 0 days  Quantity: 0.00;  Refills: 0   Ordered :31-Aug-2010  Margarita Rana MD;  Started 19-June-2007 Active Comments: DX: 272.0    . simvastatin (ZOCOR) 20 MG tablet Take 1 tablet (20 mg total) by mouth at bedtime. 90 tablet 3  . tamsulosin (FLOMAX) 0.4 MG CAPS capsule Take 2 capsules (0.8 mg total) by mouth daily. 180 capsule 3  . naproxen (NAPROSYN) 500 MG tablet Take 500 mg by mouth daily as needed.     . sildenafil (VIAGRA) 50 MG tablet TAKE 1 TABLET BY MOUTH AS NEEDED FOR ERECTILE DYSFUNCTION GENERIC EQUIVALENT FOR VIAGRA (Patient not taking: Reported on 10/31/2019) 8 tablet 3   No current facility-administered medications for this visit.     Review of Systems  Constitutional: Negative.  Negative for chills, diaphoresis, fever, malaise/fatigue and weight loss.       Feels "fine".  HENT: Negative.  Negative for congestion, ear discharge, ear pain, nosebleeds, sinus pain and sore throat.   Eyes: Negative.  Negative for blurred vision, double vision, photophobia and pain.  Respiratory: Negative for cough, hemoptysis, sputum production and shortness of breath.        Sleep apnea on CPAP.  Cardiovascular: Negative.  Negative for chest pain, palpitations, orthopnea, leg swelling and PND.  Gastrointestinal: Negative.  Negative for abdominal pain, blood in stool, constipation, diarrhea, melena, nausea and vomiting.  Genitourinary: Negative.  Negative for dysuria, frequency, hematuria and urgency.       No testosterone use.  Musculoskeletal: Negative.  Negative for back pain, falls, joint pain, myalgias and neck pain.  Skin: Negative.  Negative for itching and rash.  Neurological: Negative.   Negative for dizziness, tremors, sensory change, speech change, focal weakness, weakness and headaches.  Endo/Heme/Allergies: Negative.  Does not bruise/bleed easily.  Psychiatric/Behavioral: Negative.  Negative for depression and memory loss. The patient is not nervous/anxious and does not have insomnia.   All other systems reviewed and are negative.  Performance status (ECOG): 0  Physical Exam  Constitutional: He is oriented to person, place, and time. He appears well-developed. No distress.  HENT:  Head: Normocephalic and atraumatic.  Short gray hair.  Eyes: Conjunctivae and EOM are normal. No scleral icterus.  Neurological: He is alert and oriented to person, place, and time.  Skin: He is not diaphoretic.  Psychiatric: He has a normal mood and affect. His behavior is normal. Judgment and thought content normal.  Nursing note reviewed.   Appointment on 10/31/2019  Component Date Value Ref Range Status  . Ferritin 10/31/2019 4* 24 - 336 ng/mL Final   Performed at [facility], [address] [place], [place] [address]  . WBC 10/31/2019 8.8  4.0 - 10.5 K/uL Final  . RBC 10/31/2019  6.58* 4.22 - 5.81 MIL/uL Final  . Hemoglobin 10/31/2019 13.4  13.0 - 17.0 g/dL Final  . HCT 10/31/2019 49.1  39.0 - 52.0 % Final  . MCV 10/31/2019 74.6* 80.0 - 100.0 fL Final  . MCH 10/31/2019 20.4* 26.0 - 34.0 pg Final  . MCHC 10/31/2019 27.3* 30.0 - 36.0 g/dL Final  . RDW 10/31/2019 19.6* 11.5 - 15.5 % Final  . Platelets 10/31/2019 138* 150 - 400 K/uL Final  . nRBC 10/31/2019 0.0  0.0 - 0.2 % Final  . Neutrophils Relative % 10/31/2019 74  % Final  . Neutro Abs 10/31/2019 6.5  1.7 - 7.7 K/uL Final  . Lymphocytes Relative 10/31/2019 14  % Final  . Lymphs Abs 10/31/2019 1.3  0.7 - 4.0 K/uL Final  . Monocytes Relative 10/31/2019 9  % Final  . Monocytes Absolute 10/31/2019 0.8  0.1 - 1.0 K/uL Final  . Eosinophils Relative 10/31/2019 1  % Final  . Eosinophils Absolute 10/31/2019 0.1  0.0 -  0.5 K/uL Final  . Basophils Relative 10/31/2019 1  % Final  . Basophils Absolute 10/31/2019 0.1  0.0 - 0.1 K/uL Final  . Immature Granulocytes 10/31/2019 1  % Final  . Abs Immature Granulocytes 10/31/2019 0.06  0.00 - 0.07 K/uL Final   Performed at [facility], [address], [place], [place] [address]    Assessment:  Jason Bolton is a 63 y.o. male withsecondary polycythemiasince 2013/2014. Etiology is felt secondary to sleep apnea. He uses CPAP. He has a 30 pack year smoking history. He stopped smoking in 2016. He does not use testosterone. Initial labs ruled out polycythemia rubra vera. JAK2 V617F and exon 12 were negative on 10/17/2017. Erythropoietin level was normal. He is on a baby aspirin.  He undergoes phlebotomy to keep his hematocrit < 48. Last phlebotomy was on 09/19/2019.   Ferritinhas been followed: 6 on 05/12/2017 5 on 10/17/2017, 6 on 11/16/2017, 12 on 05/02/2018, 6 on 11/10/2018, 6 on 05/15/2019, and 4 on 10/31/2019.Epo levelwas 25.3 on 10/17/2017,34.6 on 05/02/2018, and 34.3 on 11/10/2018.  Symptomatically, he feels fine.    Plan: 1.   Labs today: CBC with diff, ferritin. 2.   Secondary polycythemia Hematocrit 49.1.  Hemoglobin 13.4.               Hematocrit goal is < 48.               He last underwent phlebotomy on 09/19/2019.               He notes a headache when his hematocrit is elevated and relieved after phlebotomy.  Continue current phlebotomy schedule.  Etiology felt secondary to sleep apnea and smoking.  3. Elevated erythropoietin level Epo level is slightly elevated in secondary polycythemia. Epo level can be elevated in response to hypoxia or erythropoietin secreting tumor. The degree of elevation does not reveal the underlying cause.  Continue to monitor. 4.   Health maintenance  Last colonoscopy was 10/19/2017.   Next colonoscopy in 3 years per patient.  Ferritin is 4.    Patient denies any bleeding.   Low ferritin likely secondary to phlebotomies. 5.   RTC monthly for CBC +/- phlebotomy. 6.   RTC in 6 months for MD assessment, labs (CBC with diff, ferritin), and +/- phlebotomy.    I discussed the assessment and treatment plan with the patient.  The patient was provided an opportunity to ask questions and all were answered.  The patient agreed with the  plan and demonstrated an understanding of the instructions.  The patient was advised to call back if the symptoms worsen or if the condition fails to improve as anticipated.  I provided 14 minutes (3:26 PM - 3:39 PM) of face-to-face video visit time during this this encounter and > 50% was spent counseling as documented under my assessment and plan.  I provided these services from the [facility] office.   Lequita Asal, MD, PhD    11/01/2019, 3:26 PM  I, Selena Batten, am acting as scribe for [employer]. Mike Gip, MD, PhD.  I, Melissa C. Mike Gip, MD, have reviewed the above documentation for accuracy and completeness, and I agree with the above.

## 2019-10-31 NOTE — Progress Notes (Signed)
No new changes noted today. The patient name and DOB has been verified by phone today. 

## 2019-11-01 ENCOUNTER — Inpatient Hospital Stay: Payer: BC Managed Care – PPO

## 2019-11-01 ENCOUNTER — Inpatient Hospital Stay: Payer: BC Managed Care – PPO | Admitting: Hematology and Oncology

## 2019-11-01 ENCOUNTER — Inpatient Hospital Stay (HOSPITAL_BASED_OUTPATIENT_CLINIC_OR_DEPARTMENT_OTHER): Payer: BC Managed Care – PPO | Admitting: Hematology and Oncology

## 2019-11-01 DIAGNOSIS — D751 Secondary polycythemia: Secondary | ICD-10-CM

## 2019-11-29 ENCOUNTER — Inpatient Hospital Stay: Payer: BC Managed Care – PPO

## 2019-11-30 DIAGNOSIS — G4733 Obstructive sleep apnea (adult) (pediatric): Secondary | ICD-10-CM | POA: Diagnosis not present

## 2019-12-24 ENCOUNTER — Other Ambulatory Visit: Payer: Self-pay

## 2019-12-25 ENCOUNTER — Inpatient Hospital Stay: Payer: BC Managed Care – PPO

## 2019-12-25 ENCOUNTER — Other Ambulatory Visit: Payer: Self-pay

## 2019-12-25 ENCOUNTER — Inpatient Hospital Stay: Payer: BC Managed Care – PPO | Attending: Hematology and Oncology

## 2019-12-25 VITALS — BP 140/82 | HR 86 | Temp 96.7°F | Resp 20

## 2019-12-25 DIAGNOSIS — D751 Secondary polycythemia: Secondary | ICD-10-CM

## 2019-12-25 LAB — CBC
HCT: 52.8 % — ABNORMAL HIGH (ref 39.0–52.0)
Hemoglobin: 14.1 g/dL (ref 13.0–17.0)
MCH: 20.1 pg — ABNORMAL LOW (ref 26.0–34.0)
MCHC: 26.7 g/dL — ABNORMAL LOW (ref 30.0–36.0)
MCV: 75.2 fL — ABNORMAL LOW (ref 80.0–100.0)
Platelets: 147 10*3/uL — ABNORMAL LOW (ref 150–400)
RBC: 7.02 MIL/uL — ABNORMAL HIGH (ref 4.22–5.81)
RDW: 20.1 % — ABNORMAL HIGH (ref 11.5–15.5)
WBC: 9.6 10*3/uL (ref 4.0–10.5)
nRBC: 0 % (ref 0.0–0.2)

## 2019-12-27 ENCOUNTER — Inpatient Hospital Stay: Payer: BC Managed Care – PPO

## 2019-12-31 ENCOUNTER — Other Ambulatory Visit: Payer: Self-pay | Admitting: Physician Assistant

## 2019-12-31 DIAGNOSIS — I10 Essential (primary) hypertension: Secondary | ICD-10-CM

## 2019-12-31 DIAGNOSIS — N400 Enlarged prostate without lower urinary tract symptoms: Secondary | ICD-10-CM

## 2019-12-31 DIAGNOSIS — G4733 Obstructive sleep apnea (adult) (pediatric): Secondary | ICD-10-CM | POA: Diagnosis not present

## 2019-12-31 DIAGNOSIS — J301 Allergic rhinitis due to pollen: Secondary | ICD-10-CM

## 2019-12-31 DIAGNOSIS — E7800 Pure hypercholesterolemia, unspecified: Secondary | ICD-10-CM

## 2019-12-31 NOTE — Telephone Encounter (Signed)
Patient has upcoming appointment 2/17- refilled per protocol- no additional. Will need additional after appointment

## 2020-01-12 DIAGNOSIS — Z20828 Contact with and (suspected) exposure to other viral communicable diseases: Secondary | ICD-10-CM | POA: Diagnosis not present

## 2020-01-12 DIAGNOSIS — Z03818 Encounter for observation for suspected exposure to other biological agents ruled out: Secondary | ICD-10-CM | POA: Diagnosis not present

## 2020-01-15 NOTE — Progress Notes (Deleted)
       Patient: RUFORD IMBURGIA Male    DOB: January 02, 1956   64 y.o.   MRN: [id number] Visit Date: 01/15/2020  Today's Provider: Trinna Post, PA-C   No chief complaint on file.  Subjective:     HPI    Allergies  Allergen Reactions  . Penicillins Other (See Comments)    unknown     Current Outpatient Medications:  .  aspirin 81 MG tablet, Take 81 mg by mouth daily., Disp: , Rfl:  .  Coenzyme Q10 50 MG CAPS, CO ENZYME Q-10, 50MG  (Oral Capsule)  1 po qd for 0 days  Quantity: 30.00;  Refills: 0   Ordered :31-Aug-2010  Margarita Rana MD;  Started 31-Mar-2009 Active Comments: DX: 272.0, Disp: , Rfl:  .  lisinopril-hydrochlorothiazide (ZESTORETIC) 20-12.5 MG tablet, TAKE 1 TABLET BY MOUTH DAILY, Disp: 90 tablet, Rfl: 0 .  metoprolol tartrate (LOPRESSOR) 50 MG tablet, TAKE 1 TABLET(50MG ) BY MOUTH 2 TIMES DAILY., Disp: 180 tablet, Rfl: 0 .  montelukast (SINGULAIR) 10 MG tablet, TAKE 1 TABLET BY MOUTH DAILY GENERIC EQUIVALENT FOR SINGULAIR GENERIC EQUIVALENT FOR SINGULAIR, Disp: 90 tablet, Rfl: 0 .  naproxen (NAPROSYN) 500 MG tablet, Take 500 mg by mouth daily as needed. , Disp: , Rfl:  .  OMEGA-3 FATTY ACIDS PO, Take 1 tablet by mouth daily. FISH-EPA, 1000MG  (Oral Capsule)  2 po bid for 0 days  Quantity: 0.00;  Refills: 0   Ordered :31-Aug-2010  Margarita Rana MD;  Started 19-June-2007 Active Comments: DX: 272.0, Disp: , Rfl:  .  sildenafil (VIAGRA) 50 MG tablet, TAKE 1 TABLET BY MOUTH AS NEEDED FOR ERECTILE DYSFUNCTION GENERIC EQUIVALENT FOR VIAGRA (Patient not taking: Reported on 10/31/2019), Disp: 8 tablet, Rfl: 3 .  simvastatin (ZOCOR) 20 MG tablet, TAKE 1 TABLET BY MOUTH AT BEDTIME, Disp: 90 tablet, Rfl: 0 .  tamsulosin (FLOMAX) 0.4 MG CAPS capsule, TAKE 2 CAPSULES BY MOUTH DAILY GENERIC EQUIVALENT FOR FLOMAX, Disp: 180 capsule, Rfl: 0  Review of Systems  Social History   Tobacco Use  . Smoking status: Former Smoker    Packs/day: 1.50    Years: 20.00    Pack years: 30.00   Types: Cigarettes    Quit date: 11/29/2015    Years since quitting: 4.1  . Smokeless tobacco: Never Used  . Tobacco comment: quit June of 2008 after [employer]. he was exposed to secondary smoke  Substance Use Topics  . Alcohol use: Yes    Alcohol/week: 0.0 standard drinks    Comment: occasional      Objective:   There were no vitals taken for this visit. There were no vitals filed for this visit.There is no height or weight on file to calculate BMI.   Physical Exam   No results found for any visits on 01/16/20.     Assessment & [place], PA-C  [facility]

## 2020-01-16 ENCOUNTER — Encounter: Payer: Self-pay | Admitting: Physician Assistant

## 2020-01-16 ENCOUNTER — Other Ambulatory Visit: Payer: Self-pay

## 2020-01-16 ENCOUNTER — Ambulatory Visit: Payer: BC Managed Care – PPO | Admitting: Physician Assistant

## 2020-01-16 VITALS — BP 116/78 | HR 73 | Temp 97.1°F | Ht 73.0 in | Wt 290.4 lb

## 2020-01-16 DIAGNOSIS — I10 Essential (primary) hypertension: Secondary | ICD-10-CM

## 2020-01-16 DIAGNOSIS — Z6836 Body mass index (BMI) 36.0-36.9, adult: Secondary | ICD-10-CM

## 2020-01-16 DIAGNOSIS — E7800 Pure hypercholesterolemia, unspecified: Secondary | ICD-10-CM | POA: Diagnosis not present

## 2020-01-16 DIAGNOSIS — N529 Male erectile dysfunction, unspecified: Secondary | ICD-10-CM

## 2020-01-16 DIAGNOSIS — R7303 Prediabetes: Secondary | ICD-10-CM

## 2020-01-16 DIAGNOSIS — N400 Enlarged prostate without lower urinary tract symptoms: Secondary | ICD-10-CM

## 2020-01-16 MED ORDER — LISINOPRIL-HYDROCHLOROTHIAZIDE 20-12.5 MG PO TABS: 1.0000 | ORAL_TABLET | Freq: Every day | ORAL | 1 refills | Status: DC

## 2020-01-16 MED ORDER — SIMVASTATIN 20 MG PO TABS: 20.0000 mg | ORAL_TABLET | Freq: Every day | ORAL | 0 refills | Status: DC

## 2020-01-16 MED ORDER — METOPROLOL TARTRATE 50 MG PO TABS: ORAL_TABLET | ORAL | 1 refills | Status: DC

## 2020-01-16 MED ORDER — TAMSULOSIN HCL 0.4 MG PO CAPS: ORAL_CAPSULE | ORAL | 1 refills | Status: DC

## 2020-01-16 MED ORDER — SILDENAFIL CITRATE 50 MG PO TABS: ORAL_TABLET | ORAL | 3 refills | Status: DC

## 2020-01-16 NOTE — Patient Instructions (Signed)
Prediabetes Prediabetes is the condition of having a blood sugar (blood glucose) level that is higher than it should be, but not high enough for you to be diagnosed with type 2 diabetes. Having prediabetes puts you at risk for developing type 2 diabetes (type 2 diabetes mellitus). Prediabetes may be called impaired glucose tolerance or impaired fasting glucose. Prediabetes usually does not cause symptoms. Your health care provider can diagnose this condition with blood tests. You may be tested for prediabetes if you are overweight and if you have at least one other risk factor for prediabetes. What is blood glucose, and how is it measured? Blood glucose refers to the amount of glucose in your bloodstream. Glucose comes from eating foods that contain sugars and starches (carbohydrates), which the body breaks down into glucose. Your blood glucose level may be measured in mg/dL (milligrams per deciliter) or mmol/L (millimoles per liter). Your blood glucose may be checked with one or more of the following blood tests:  A fasting blood glucose (FBG) test. You will not be allowed to eat (you will fast) for 8 hours or longer before a blood sample is taken. ? A normal range for FBG is 70-100 mg/dl (3.9-5.6 mmol/L).  An A1c (hemoglobin A1c) blood test. This test provides information about blood glucose control over the previous 2?3months.  An oral glucose tolerance test (OGTT). This test measures your blood glucose at two times: ? After fasting. This is your baseline level. ? Two hours after you drink a beverage that contains glucose. You may be diagnosed with prediabetes:  If your FBG is 100?125 mg/dL (5.6-6.9 mmol/L).  If your A1c level is 5.7?6.4%.  If your OGTT result is 140?199 mg/dL (7.8-11 mmol/L). These blood tests may be repeated to confirm your diagnosis. How can this condition affect me? The pancreas produces a hormone (insulin) that helps to move glucose from the bloodstream into cells.  When cells in the body do not respond properly to insulin that the body makes (insulin resistance), excess glucose builds up in the blood instead of going into cells. As a result, high blood glucose (hyperglycemia) can develop, which can cause many complications. Hyperglycemia is a symptom of prediabetes. Having high blood glucose for a long time is dangerous. Too much glucose in your blood can damage your nerves and blood vessels. Long-term damage can lead to complications from diabetes, which may include:  Heart disease.  Stroke.  Blindness.  Kidney disease.  Depression.  Poor circulation in the feet and legs, which could lead to surgical removal (amputation) in severe cases. What can increase my risk? Risk factors for prediabetes include:  Having a family member with type 2 diabetes.  Being overweight or obese.  Being older than age 45.  Being of American Indian, African-American, Hispanic/Latino, or Asian/Pacific Islander descent.  Having an inactive (sedentary) lifestyle.  Having a history of heart disease.  History of gestational diabetes or polycystic ovary syndrome (PCOS), in women.  Having low levels of good cholesterol (HDL-C) or high levels of blood fats (triglycerides).  Having high blood pressure. What actions can I take to prevent diabetes?      Be physically active. ? Do moderate-intensity physical activity for 30 or more minutes on 5 or more days of the week, or as much as told by your health care provider. This could be brisk walking, biking, or water aerobics. ? Ask your health care provider what activities are safe for you. A mix of physical activities may be best, such as   walking, swimming, cycling, and strength training.  Lose weight as told by your health care provider. ? Losing 5-7% of your body weight can reverse insulin resistance. ? Your health care provider can determine how much weight loss is best for you and can help you lose weight  safely.  Follow a healthy meal plan. This includes eating lean proteins, complex carbohydrates, fresh fruits and vegetables, low-fat dairy products, and healthy fats. ? Follow instructions from your health care provider about eating or drinking restrictions. ? Make an appointment to see a diet and nutrition specialist (registered dietitian) to help you create a healthy eating plan that is right for you.  Do not smoke or use any tobacco products, such as cigarettes, chewing tobacco, and e-cigarettes. If you need help quitting, ask your health care provider.  Take over-the-counter and prescription medicines as told by your health care provider. You may be prescribed medicines that help lower the risk of type 2 diabetes.  Keep all follow-up visits as told by your health care provider. This is important. Summary  Prediabetes is the condition of having a blood sugar (blood glucose) level that is higher than it should be, but not high enough for you to be diagnosed with type 2 diabetes.  Having prediabetes puts you at risk for developing type 2 diabetes (type 2 diabetes mellitus).  To help prevent type 2 diabetes, make lifestyle changes such as being physically active and eating a healthy diet. Lose weight as told by your health care provider. This information is not intended to replace advice given to you by your health care provider. Make sure you discuss any questions you have with your health care provider. Document Revised: 03/09/2019 Document Reviewed: 01/06/2016 Elsevier Patient Education  2020 [employer]  

## 2020-01-16 NOTE — Progress Notes (Signed)
Patient: Jason Bolton, Male    DOB: Oct 17, 1956, 64 y.o.   MRN: [id number] Visit Date: 01/16/2020  Today's Provider: Trinna Post, PA-C   Chief Complaint  Patient presents with  . Annual Exam  . Diabetes  . Hypertension  . Hyperlipidemia   Subjective:    I, Jason Bolton,CMA am acting as a Education administrator for [employer].   Complete Physical [name] is a 64 y.o. male. He feels well. He reports exercising none. He reports he is sleeping well.  -----------------------------------------------------------  Pre-Diabetes, Follow-up:   Lab Results  Component Value Date   HGBA1C 5.8 (H) 07/11/2019   HGBA1C 6.1 (H) 01/03/2019   HGBA1C 5.7 (A) 07/14/2018   Last seen for diabetes 6 months ago.  Management since then includes no changes. He reports good compliance with treatment. He is not having side effects.  Current symptoms include none and have been stable.   Has had two doses of COVID vaccine. Completed 3-4 weeks ago.  ------------------------------------------------------------------------   Hypertension, follow-up:  BP Readings from Last 3 Encounters:  01/16/20 116/78  12/25/19 140/82  09/19/19 136/88    He was last seen for hypertension 6 months ago.  BP at that visit was 142/90. Management since that visit includes no changes.He reports good compliance with treatment. He is not having side effects.  He is not exercising. He is adherent to low salt diet.   Outside blood pressures are normal. He is experiencing none.  Patient denies chest pain, chest pressure/discomfort, exertional chest pressure/discomfort, fatigue, irregular heart beat, lower extremity edema and palpitations.   Cardiovascular risk factors include advanced age (older than 64 for men, 55 for women), diabetes mellitus, hypertension and male gender.  Use of agents associated with hypertension: none.    ------------------------------------------------------------------------    Lipid/Cholesterol, Follow-up:   Last seen for this 6 months ago.  Management since that visit includes no changes.  Last Lipid Panel:    Component Value Date/Time   CHOL 135 01/03/2019 0842   TRIG 85 01/03/2019 0842   HDL 34 (L) 01/03/2019 0842   CHOLHDL 4.8 07/14/2018 0902   LDLCALC 84 01/03/2019 0842    He reports good compliance with treatment. He is not having side effects.   Wt Readings from Last 3 Encounters:  01/16/20 290 lb 6.4 oz (131.7 kg)  07/11/19 290 lb (131.5 kg)  01/03/19 285 lb 9.6 oz (129.5 kg)    Smoking 5-6 cigarettes daily.  ------------------------------------------------------------------------     Review of Systems  Social History   Socioeconomic History  . Marital status: Married    Spouse name: Not on file  . Number of children: 2  . Years of education: Not on file  . Highest education level: Not on file  Occupational History  . Occupation: Librarian, academic  Tobacco Use  . Smoking status: Former Smoker    Packs/day: 1.50    Years: 20.00    Pack years: 30.00    Types: Cigarettes    Quit date: 11/29/2015    Years since quitting: 4.1  . Smokeless tobacco: Never Used  . Tobacco comment: quit June of 2008 after [employer]. he was exposed to secondary smoke  Substance and Sexual Activity  . Alcohol use: Yes    Alcohol/week: 0.0 standard drinks    Comment: occasional  . Drug use: No  . Sexual activity: Not on file  Other Topics Concern  . Not on file  Social History Narrative  . Not on file  Social Determinants of Health   Financial Resource Strain:   . Difficulty of Paying Living Expenses: Not on file  Food Insecurity:   . Worried About Charity fundraiser in the Last Year: Not on file  . Ran Out of Food in the Last Year: Not on file  Transportation Needs:   . Lack of Transportation (Medical): Not on file  . Lack of Transportation (Non-Medical): Not on  file  Physical Activity:   . Days of Exercise per Week: Not on file  . Minutes of Exercise per Session: Not on file  Stress:   . Feeling of Stress : Not on file  Social Connections:   . Frequency of Communication with Friends and Family: Not on file  . Frequency of Social Gatherings with Friends and Family: Not on file  . Attends Religious Services: Not on file  . Active Member of Clubs or Organizations: Not on file  . Attends Archivist Meetings: Not on file  . Marital Status: Not on file  Intimate Partner Violence:   . Fear of Current or Ex-Partner: Not on file  . Emotionally Abused: Not on file  . Physically Abused: Not on file  . Sexually Abused: Not on file    Past Medical History:  Diagnosis Date  . Allergy   . Hypercholesterolemia   . Hypertension   . 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 Polycythemia   . Sleep apnea      Patient Active Problem List   Diagnosis Date Noted  . Prediabetes 12/20/2017  . History of adenomatous polyp of colon 10/23/2017  . Encounter for screening colonoscopy 06/28/2017  . Dysuria 10/01/2016  . Essential hypertension 10/01/2016  . BMI 36.0-36.9,adult 03/15/2016  . Obesity 03/15/2016  . Baker cyst 04/29/2015  . Fecal occult blood test positive 04/29/2015  . Apnea, sleep 04/29/2015  . Hay fever 02/17/2009  . Benign prostatic hyperplasia 02/17/2009  . ED (erectile dysfunction) of organic origin 04/21/2007  . Compulsive tobacco user syndrome 04/21/2007  . Essential (primary) hypertension 04/19/2007  . Acquired polycythemia 04/19/2007  . Hypercholesterolemia without hypertriglyceridemia 05/19/2006    Past Surgical History:  Procedure Laterality Date  . CATARACT EXTRACTION Right 11/2013  . COLONOSCOPY  2006  . COLONOSCOPY WITH PROPOFOL N/A 10/19/2017   Procedure: COLONOSCOPY WITH PROPOFOL;  Surgeon: [name], MD;  Location: [facility] ENDOSCOPY;  Service: Endoscopy;  Laterality:  N/A;  . INGUINAL HERNIA REPAIR Right 1972  . MANDIBLE SURGERY  2005   [facility]    His family history includes Congestive Heart Failure in his mother; Diabetes in his mother; Healthy in his brother, brother, brother, and sister; Pancreatic cancer in his father.   Current Outpatient Medications:  .  aspirin 81 MG tablet, Take 81 mg by mouth daily., Disp: , Rfl:  .  Coenzyme Q10 50 MG CAPS, CO ENZYME Q-10, 50MG  (Oral Capsule)  1 po qd for 0 days  Quantity: 30.00;  Refills: 0   Ordered :31-Aug-2010  Margarita Rana MD;  Started 31-Mar-2009 Active Comments: DX: 272.0, Disp: , Rfl:  .  lisinopril-hydrochlorothiazide (ZESTORETIC) 20-12.5 MG tablet, Take 1 tablet by mouth daily., Disp: 90 tablet, Rfl: 1 .  metoprolol tartrate (LOPRESSOR) 50 MG tablet, TAKE 1 TABLET(50MG ) BY MOUTH 2 TIMES DAILY., Disp: 180 tablet, Rfl: 1 .  montelukast (SINGULAIR) 10 MG tablet, TAKE 1 TABLET BY MOUTH DAILY GENERIC EQUIVALENT FOR SINGULAIR GENERIC EQUIVALENT FOR SINGULAIR, Disp: 90 tablet, Rfl: 0 .  naproxen (NAPROSYN) 500 MG tablet, Take 500 mg by mouth daily as needed. , Disp: , Rfl:  .  OMEGA-3 FATTY ACIDS PO, Take 1 tablet by mouth daily. FISH-EPA, 1000MG  (Oral Capsule)  2 po bid for 0 days  Quantity: 0.00;  Refills: 0   Ordered :31-Aug-2010  Margarita Rana MD;  Started 19-June-2007 Active Comments: DX: 272.0, Disp: , Rfl:  .  simvastatin (ZOCOR) 20 MG tablet, Take 1 tablet (20 mg total) by mouth at bedtime., Disp: 90 tablet, Rfl: 0 .  tamsulosin (FLOMAX) 0.4 MG CAPS capsule, TAKE 2 CAPSULES BY MOUTH DAILY GENERIC EQUIVALENT FOR FLOMAX, Disp: 180 capsule, Rfl: 1 .  sildenafil (VIAGRA) 50 MG tablet, TAKE 1 TABLET BY MOUTH AS NEEDED FOR ERECTILE DYSFUNCTION GENERIC EQUIVALENT FOR VIAGRA, Disp: 8 tablet, Rfl: 3  Patient Care Team: Paulene Floor as PCP - General (Physician Assistant) Trinna Post, PA-C as Physician Assistant (Physician Assistant) [name], MD (General Surgery) Lequita Asal, MD  as Referring Physician (Hematology and Oncology)     Objective:    Vitals: BP 116/78 (BP Location: Left Arm, Patient Position: Sitting, Cuff Size: Normal)   Pulse 73   Temp (!) 97.1 F (36.2 C) (Temporal)   Ht 6\' 1"  (1.854 m)   Wt 290 lb 6.4 oz (131.7 kg)   SpO2 95%   BMI 38.31 kg/m   Physical Exam Constitutional:      Appearance: Normal appearance.  Cardiovascular:     Rate and Rhythm: Normal rate and regular rhythm.     Heart sounds: Normal heart sounds.  Pulmonary:     Effort: Pulmonary effort is normal.     Breath sounds: Normal breath sounds.  Skin:    General: Skin is warm and dry.  Neurological:     Mental Status: He is alert and oriented to person, place, and time. Mental status is at baseline.  Psychiatric:        Mood and Affect: Mood normal.        Behavior: Behavior normal.     Activities of Daily Living In your present state of health, do you have any difficulty performing the following activities: 01/16/2020  Hearing? N  Vision? N  Difficulty concentrating or making decisions? N  Walking or climbing stairs? N  Dressing or bathing? N  Doing errands, shopping? N  Some recent data might be hidden    Fall Risk Assessment Fall Risk  01/16/2020  Falls in the past year? 0  Number falls in past yr: 0  Injury with Fall? 0     Depression Screen PHQ 2/9 Scores 01/16/2020 01/03/2019 06/07/2017 03/08/2017  PHQ - 2 Score 0 0 0 0  PHQ- 9 Score 1 0 0 0    No flowsheet data found.     Assessment & Plan:    Annual Physical Reviewed patient's Family Medical History Reviewed and updated list of patient's medical providers Assessment of cognitive impairment was done Assessed patient's functional ability Established a written schedule for health screening [place] Completed and Reviewed  Exercise Activities and Dietary recommendations Goals   None     Immunization History  Administered Date(s) Administered  . Fluad Quad(high Dose  65+) 09/19/2019  . Influenza,inj,Quad PF,6+ Mos 10/01/2016, 09/05/2017, 09/11/2018  . Influenza-Unspecified 09/22/2015  . Pneumococcal Conjugate-13 01/03/2019  . Pneumococcal Polysaccharide-23 01/15/2015  . Td 06/07/2017  . Tdap 04/19/2007  . Zoster Recombinat (Shingrix) 12/20/2017, 07/14/2018    Health Maintenance  Topic Date Due  .  PNA vac Low Risk Adult (2 of 2 - PPSV23) 01/16/2020  . TETANUS/TDAP  06/08/2027  . COLONOSCOPY  10/20/2027  . INFLUENZA VACCINE  Completed  . Hepatitis C Screening  Completed     Discussed health benefits of physical activity, and encouraged him to engage in regular exercise appropriate for his age and condition.    1. Essential (primary) hypertension  Well controlled, continue medications as below.  We will administer pneumonia 23 at next visit since he just had covid vaccine in the past month.   - Comprehensive metabolic panel - Hemoglobin A1c - Lipid Profile - TSH - lisinopril-hydrochlorothiazide (ZESTORETIC) 20-12.5 MG tablet; Take 1 tablet by mouth daily.  Dispense: 90 tablet; Refill: 1 - metoprolol tartrate (LOPRESSOR) 50 MG tablet; TAKE 1 TABLET(50MG ) BY MOUTH 2 TIMES DAILY.  Dispense: 180 tablet; Refill: 1  2. BMI 36.0-36.9,adult   3. Prediabetes  - Comprehensive metabolic panel - Hemoglobin A1c - Lipid Profile - TSH  4. Hypercholesterolemia without hypertriglyceridemia  Continue as below.   - simvastatin (ZOCOR) 20 MG tablet; Take 1 tablet (20 mg total) by mouth at bedtime.  Dispense: 90 tablet; Refill: 0  5. ED (erectile dysfunction) of organic origin  Discussed insurance coverage and can call around for cash price, consider mail order with [facility] in [place], [place].   - sildenafil (VIAGRA) 50 MG tablet; TAKE 1 TABLET BY MOUTH AS NEEDED FOR ERECTILE DYSFUNCTION GENERIC EQUIVALENT FOR VIAGRA  Dispense: 8 tablet; Refill: 3  6. Benign prostatic hyperplasia, unspecified whether lower urinary tract symptoms present  -  tamsulosin (FLOMAX) 0.4 MG CAPS capsule; TAKE 2 CAPSULES BY MOUTH DAILY GENERIC EQUIVALENT FOR FLOMAX  Dispense: 180 capsule; Refill: 1  The entirety of the information documented in the History of Present Illness, Review of Systems and Physical Exam were personally obtained by me. Portions of this information were initially documented by [facility] and reviewed by me for thoroughness and accuracy.   Follow up in 6 months for chronic and physical.  ------------------------------------------------------------------------------------------------------------    Trinna Post, PA-C  [facility]

## 2020-01-17 LAB — LIPID PANEL
Chol/HDL Ratio: 3.2 ratio (ref 0.0–5.0)
Cholesterol, Total: 104 mg/dL (ref 100–199)
HDL: 33 mg/dL — ABNORMAL LOW (ref >39)
LDL Chol Calc (NIH): 58 mg/dL (ref 0–99)
Triglycerides: 58 mg/dL (ref 0–149)
VLDL Cholesterol Cal: 13 mg/dL (ref 5–40)

## 2020-01-17 LAB — COMPREHENSIVE METABOLIC PANEL
ALT: 17 IU/L (ref 0–44)
AST: 20 IU/L (ref 0–40)
Albumin/Globulin Ratio: 1.5 (ref 1.2–2.2)
Albumin: 4 g/dL (ref 3.8–4.8)
Alkaline Phosphatase: 57 IU/L (ref 39–117)
BUN/Creatinine Ratio: 14 (ref 10–24)
BUN: 14 mg/dL (ref 8–27)
Bilirubin Total: 0.5 mg/dL (ref 0.0–1.2)
CO2: 27 mmol/L (ref 20–29)
Calcium: 9.5 mg/dL (ref 8.6–10.2)
Chloride: 96 mmol/L (ref 96–106)
Creatinine, Ser: 1.01 mg/dL (ref 0.76–1.27)
GFR calc Af Amer: 89 mL/min/{1.73_m2} (ref >59)
GFR calc non Af Amer: 77 mL/min/{1.73_m2} (ref >59)
Globulin, Total: 2.7 g/dL (ref 1.5–4.5)
Glucose: 80 mg/dL (ref 65–99)
Potassium: 5.1 mmol/L (ref 3.5–5.2)
Sodium: 141 mmol/L (ref 134–144)
Total Protein: 6.7 g/dL (ref 6.0–8.5)

## 2020-01-17 LAB — TSH: TSH: 1.71 u[IU]/mL (ref 0.450–4.500)

## 2020-01-17 LAB — HEMOGLOBIN A1C
Est. average glucose Bld gHb Est-mCnc: 128 mg/dL
Hgb A1c MFr Bld: 6.1 % — ABNORMAL HIGH (ref 4.8–5.6)

## 2020-01-24 ENCOUNTER — Inpatient Hospital Stay: Payer: BC Managed Care – PPO

## 2020-01-28 DIAGNOSIS — G4733 Obstructive sleep apnea (adult) (pediatric): Secondary | ICD-10-CM | POA: Diagnosis not present

## 2020-02-21 ENCOUNTER — Inpatient Hospital Stay: Payer: BC Managed Care – PPO

## 2020-02-26 ENCOUNTER — Inpatient Hospital Stay: Payer: BC Managed Care – PPO | Attending: Hematology and Oncology

## 2020-02-26 ENCOUNTER — Inpatient Hospital Stay: Payer: BC Managed Care – PPO

## 2020-02-26 VITALS — BP 134/86 | HR 66 | Temp 97.8°F | Resp 18

## 2020-02-26 DIAGNOSIS — D751 Secondary polycythemia: Secondary | ICD-10-CM | POA: Insufficient documentation

## 2020-02-26 LAB — CBC WITH DIFFERENTIAL/PLATELET
Abs Immature Granulocytes: 0.07 10*3/uL (ref 0.00–0.07)
Basophils Absolute: 0.1 10*3/uL (ref 0.0–0.1)
Basophils Relative: 1 %
Eosinophils Absolute: 0.2 10*3/uL (ref 0.0–0.5)
Eosinophils Relative: 2 %
HCT: 54.7 % — ABNORMAL HIGH (ref 39.0–52.0)
Hemoglobin: 15.2 g/dL (ref 13.0–17.0)
Immature Granulocytes: 1 %
Lymphocytes Relative: 15 %
Lymphs Abs: 1.4 10*3/uL (ref 0.7–4.0)
MCH: 20.5 pg — ABNORMAL LOW (ref 26.0–34.0)
MCHC: 27.8 g/dL — ABNORMAL LOW (ref 30.0–36.0)
MCV: 73.7 fL — ABNORMAL LOW (ref 80.0–100.0)
Monocytes Absolute: 0.8 10*3/uL (ref 0.1–1.0)
Monocytes Relative: 8 %
Neutro Abs: 6.9 10*3/uL (ref 1.7–7.7)
Neutrophils Relative %: 73 %
Platelets: 146 10*3/uL — ABNORMAL LOW (ref 150–400)
RBC: 7.42 MIL/uL — ABNORMAL HIGH (ref 4.22–5.81)
RDW: 21.2 % — ABNORMAL HIGH (ref 11.5–15.5)
WBC: 9.3 10*3/uL (ref 4.0–10.5)
nRBC: 0 % (ref 0.0–0.2)

## 2020-02-28 DIAGNOSIS — G4733 Obstructive sleep apnea (adult) (pediatric): Secondary | ICD-10-CM | POA: Diagnosis not present

## 2020-03-20 ENCOUNTER — Other Ambulatory Visit: Payer: Self-pay | Admitting: Physician Assistant

## 2020-03-20 ENCOUNTER — Inpatient Hospital Stay: Payer: BC Managed Care – PPO | Attending: Hematology and Oncology

## 2020-03-20 ENCOUNTER — Inpatient Hospital Stay: Payer: BC Managed Care – PPO

## 2020-03-20 DIAGNOSIS — I10 Essential (primary) hypertension: Secondary | ICD-10-CM

## 2020-03-20 DIAGNOSIS — N400 Enlarged prostate without lower urinary tract symptoms: Secondary | ICD-10-CM

## 2020-03-20 DIAGNOSIS — E7800 Pure hypercholesterolemia, unspecified: Secondary | ICD-10-CM

## 2020-03-20 DIAGNOSIS — J301 Allergic rhinitis due to pollen: Secondary | ICD-10-CM

## 2020-03-29 DIAGNOSIS — G4733 Obstructive sleep apnea (adult) (pediatric): Secondary | ICD-10-CM | POA: Diagnosis not present

## 2020-04-17 ENCOUNTER — Other Ambulatory Visit: Payer: BC Managed Care – PPO

## 2020-04-17 ENCOUNTER — Ambulatory Visit: Payer: BC Managed Care – PPO | Admitting: Hematology and Oncology

## 2020-04-22 ENCOUNTER — Encounter: Payer: Self-pay | Admitting: Hematology and Oncology

## 2020-04-22 NOTE — Progress Notes (Signed)
[facility]  [address], [address] [place], [place] [address] Phone: [phone]  Fax: [phone]   Office Visit:  04/23/2020  Referring physician: Trinna Post, PA-C  Chief Complaint: Jason Bolton is a 64 y.o. 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  HPI: The patient was last seen in the hematology clinic on 11/01/2019 via telemedicine. At that time, he felt fine.  Hematocrit was 49.1, hemoglobin 13.4, MCV 74.6, platelets 138,000, WBC 8800 with an [place] of 6500.  Ferritin was 4.  Labs followed:  12/25/2019: Hematocrit 52.8, hemoglobin 14.1, MCV 75.2, platelets 147,000, WBC 9600.  02/26/2020: Hematocrit 54.7, hemoglobin 15.2, MCV 73.7, platelets 146,000, WBC 9300, ANC 6900.   He underwent phlebotomy on 12/25/2019 and 02/26/2020.   Symptomatically, he feels good today. He says he does not usually feel different after his phlebotomies. If his hematocrit hits 51-52, he begins to have a dull headache. He uses a CPAP machine and notes his phone has good readings (shown in clinic- 100% last night).  He began smoking again 3 months ago. He denies taking any exogenous testosterone.   He received both doses of the COVID-19 vaccine; the last dose was in the middle of March 2021.    Past Medical History:  Diagnosis Date  . Allergy   . Hypercholesterolemia   . Hypertension   . 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 Polycythemia   . Sleep apnea     Past Surgical History:  Procedure Laterality Date  . CATARACT EXTRACTION Right 11/2013  . COLONOSCOPY  2006  . COLONOSCOPY WITH PROPOFOL N/A 10/19/2017   Procedure: COLONOSCOPY WITH PROPOFOL;  Surgeon: [name], MD;  Location: [facility] ENDOSCOPY;  Service: Endoscopy;  Laterality: N/A;  . INGUINAL HERNIA REPAIR Right 1972  . MANDIBLE SURGERY  2005   [facility]    Family History  Problem Relation Age of Onset  . Diabetes Mother   .  Congestive Heart Failure Mother   . Pancreatic cancer Father   . Healthy Sister   . Healthy Brother   . Healthy Brother   . Healthy Brother     Social History:  reports that he quit smoking about 4 years ago. His smoking use included cigarettes. He has a 30.00 pack-year smoking history. He has never used smokeless tobacco. He reports current alcohol use. He reports that he does not use drugs. He stopped smoking 2 years ago. He began smoking again 3 months ago.Patient is smoking 10-12 cigarettes again at this point. He has not had any exposure to formaldehyde in 20 years. He runs 2 funeral homes. 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  Allergies:  Allergies  Allergen Reactions  . Penicillins Other (See Comments)    unknown    Current Medications: Current Outpatient Medications  Medication Sig Dispense Refill  . aspirin 81 MG tablet Take 81 mg by mouth daily.    . Coenzyme Q10 50 MG CAPS CO ENZYME Q-10, 50MG  (Oral Capsule)  1 po qd for 0 days  Quantity: 30.00;  Refills: 0   Ordered :31-Aug-2010  Margarita Rana MD;  Started 31-Mar-2009 Active Comments: DX: 272.0    . lisinopril-hydrochlorothiazide (ZESTORETIC) 20-12.5 MG tablet TAKE 1 TABLET BY MOUTH DAILY 90 tablet 0  . metoprolol tartrate (LOPRESSOR) 50 MG tablet TAKE 1 TABLET(50MG ) BY MOUTH 2 TIMES DAILY. 180 tablet 0  . montelukast (SINGULAIR) 10 MG tablet TAKE 1 TABLET BY MOUTH DAILY GENERIC EQUIVALENT  FOR SINGULAIR 90 tablet 0  . naproxen (NAPROSYN) 500 MG tablet Take 500 mg by mouth daily as needed.     . OMEGA-3 FATTY ACIDS PO Take 1 tablet by mouth daily. FISH-EPA, 1000MG  (Oral Capsule)  2 po bid for 0 days  Quantity: 0.00;  Refills: 0   Ordered :31-Aug-2010  Margarita Rana MD;  Started 19-June-2007 Active Comments: DX: 272.0    . sildenafil (VIAGRA) 50 MG tablet TAKE 1 TABLET BY MOUTH AS NEEDED FOR ERECTILE DYSFUNCTION GENERIC EQUIVALENT FOR VIAGRA 8 tablet 3  . simvastatin (ZOCOR) 20 MG tablet TAKE 1 TABLET BY MOUTH AT BEDTIME 90 tablet 0    . tamsulosin (FLOMAX) 0.4 MG CAPS capsule TAKE 2 CAPSULES BY MOUTH DAILY GENERIC EQUIVALENT FOR FLOMAX 180 capsule 0   No current facility-administered medications for this visit.    Review of Systems  Constitutional: Negative.  Negative for chills, diaphoresis, fever, malaise/fatigue and weight loss.       Feels "good".  HENT: Negative.  Negative for congestion, ear discharge, ear pain, hearing loss, nosebleeds, sinus pain, sore throat and tinnitus.   Eyes: Negative.  Negative for blurred vision, double vision, photophobia, pain, discharge and redness.  Respiratory: Negative for cough, hemoptysis, sputum production, shortness of breath, wheezing and stridor.        Sleep apnea on CPAP.  Cardiovascular: Negative.  Negative for chest pain, palpitations, orthopnea, claudication, leg swelling and PND.  Gastrointestinal: Negative.  Negative for abdominal pain, blood in stool, constipation, diarrhea, heartburn, melena, nausea and vomiting.  Genitourinary: Negative.  Negative for dysuria, flank pain, frequency, hematuria and urgency.       No testosterone use.  Musculoskeletal: Negative.  Negative for back pain, falls, joint pain, myalgias and neck pain.  Skin: Negative.  Negative for itching and rash.  Neurological: Positive for headaches (intermittent over the last two weeks. ). Negative for dizziness, tingling, tremors, sensory change, speech change, focal weakness, seizures, loss of consciousness and weakness.  Endo/Heme/Allergies: Negative.  Negative for environmental allergies. Does not bruise/bleed easily.  Psychiatric/Behavioral: Negative.  Negative for depression, hallucinations, memory loss, substance abuse and suicidal ideas. The patient is not nervous/anxious and does not have insomnia.   All other systems reviewed and are negative.  Performance status (ECOG): 0 - Asymptomatic  Blood pressure (!) 145/92, pulse 71, temperature (!) 96.2 F (35.7 C), temperature source Tympanic, resp.  rate 16, weight 290 lb 10.8 oz (131.8 kg), SpO2 97 %.  Physical Exam Vitals and nursing note reviewed.  Constitutional:      General: He is not in acute distress.    Appearance: Normal appearance. He is well-developed and well-nourished. He is not diaphoretic.     Interventions: Face mask in place.  HENT:     Head: Normocephalic and atraumatic.     Mouth/Throat:     Mouth: Oropharynx is clear and moist and mucous membranes are normal. No oral lesions.      Comments: Short gray hair. Mustache. Eyes:     General: No scleral icterus.       Right eye: No discharge.        Left eye: No discharge.     Extraocular Movements: EOM normal.     Conjunctiva/sclera: Conjunctivae normal.     Pupils: Pupils are equal, round, and reactive to light.  Neck:     Vascular: No JVD.  Cardiovascular:     Rate and Rhythm: Normal rate and regular rhythm.     Pulses: Intact distal pulses.  Heart sounds: Normal heart sounds. No murmur heard.  No friction rub. No gallop.   Pulmonary:     Effort: Pulmonary effort is normal.     Breath sounds: Normal breath sounds. No wheezing or rhonchi.  Abdominal:     General: Bowel sounds are normal. There is no distension.     Palpations: Abdomen is soft. There is no hepatosplenomegaly or mass.     Tenderness: There is no abdominal tenderness. There is no CVA tenderness, guarding or rebound.  Musculoskeletal:        General: No tenderness or edema. Normal range of motion.     Cervical back: Normal range of motion and neck supple.  Lymphadenopathy:     Head:     Right side of head: No preauricular, posterior auricular or occipital adenopathy.     Left side of head: No preauricular, posterior auricular or occipital adenopathy.     Cervical: No cervical adenopathy.     Upper Body:  No axillary adenopathy present.    Right upper body: No supraclavicular adenopathy.     Left upper body: No supraclavicular adenopathy.     Lower Body: No right inguinal adenopathy.  No left inguinal adenopathy.  Skin:    General: Skin is warm, dry and intact.     Coloration: Skin is not pale.     Findings: No bruising, erythema, lesion or rash.  Neurological:     Mental Status: He is alert and oriented to person, place, and time.  Psychiatric:        Mood and Affect: Mood and affect normal.        Behavior: Behavior normal.        Thought Content: Thought content normal.        Judgment: Judgment normal.    Appointment on 04/23/2020  Component Date Value Ref Range Status  . WBC 04/23/2020 8.7  4.0 - 10.5 K/uL Final  . RBC 04/23/2020 7.46* 4.22 - 5.81 MIL/uL Final  . Hemoglobin 04/23/2020 15.4  13.0 - 17.0 g/dL Final  . HCT 04/23/2020 55.3* 39.0 - 52.0 % Final  . MCV 04/23/2020 74.1* 80.0 - 100.0 fL Final  . MCH 04/23/2020 20.6* 26.0 - 34.0 pg Final  . MCHC 04/23/2020 27.8* 30.0 - 36.0 g/dL Final  . RDW 04/23/2020 21.3* 11.5 - 15.5 % Final  . Platelets 04/23/2020 149* 150 - 400 K/uL Final  . nRBC 04/23/2020 0.0  0.0 - 0.2 % Final   Performed at [facility], [address], [place], [place] [address]    Assessment:  Jason Bolton is a 64 y.o. male withsecondary polycythemiasince 2013/2014. Etiology is felt secondary to sleep apnea. He uses CPAP. He has a 30 pack year smoking history. He stopped smoking in 2016. He does not use testosterone. Initial labs ruled out polycythemia rubra vera. JAK2 V617F and exon 12 were negative on 10/17/2017. Erythropoietin level was normal. He is on a baby aspirin.  He undergoes phlebotomy to keep his hematocrit < 48. Last phlebotomy was on 02/26/2020.   Ferritinhas been followed: 6 on 05/12/2017 5 on 10/17/2017, 6 on 11/16/2017, 12 on 05/02/2018, 6 on 11/10/2018, 6 on 05/15/2019, 4 on 10/31/2019, and 5 on 04/23/2020.Epo levelwas 25.3 on 10/17/2017,34.6 on 05/02/2018, and 34.3 on 11/10/2018.  He received the COVID-19 vaccine (last dose in the middle of March 2021).   Symptomatically, he feels  "all right".  CPAP machine is working well.  He started smoking again.  He has a dull headache  when his hematocrit is 51-52.  Exam is stable.  Plan: 1.   Labs today: CBC with diff, ferritin. 2.   Secondary polycythemia Hematocrit 55.3.  Hemoglobin 15.4.  MCV 74.1             Hematocrit goal is < 48.               He last underwent phlebotomy on 01/30/2020.               He notes a headache when his HCT is 51-52 and relieved after phlebotomy.  Review of the last 3 CBCs dating back to 12/25/2019.   Hematocrit has been persistently elevated despite low iron stores.   Discuss initial work-up for polycythemia.      He had an elevated carbon monoxide level of 9.1% on 10/17/2017.    EPO level is slightly elevated.  Discuss repeating work-up as his CPAP machine appears to be working well.  Encourage patient to stop smoking. 3. Elevated erythropoietin level Epo level is slightly elevated in secondary polycythemia. Epo level can be elevated in response to hypoxia or erythropoietin secreting tumor. The degree of elevation does not reveal the underlying cause.  Continue to monitor. 4.   Health maintenance  Last colonoscopy was 10/19/2017.   Next colonoscopy in 3 years per patient.  Ferritin is 5.   Patient denies any bleeding.   Low ferritin likely secondary to phlebotomies. 5.   Phlebotomy today. 6.   RTC in 2 weeks for labs (CBC, JAK2 with reflex to 12-15, CALR, MPL, carbon monoxide level, testosterone, epo level) and +/- phlebotomy. 8.   RTC in 6 weeks for MD assessment, labs (CBC with diff, ferritin), and +/- phlebotomy.  I discussed the assessment and treatment plan with the patient.  The patient was provided an opportunity to ask questions and all were answered.  The patient agreed with the plan and demonstrated an understanding of the instructions.  The patient was advised to call back if the symptoms worsen or if the condition fails to  improve as anticipated.   Lequita Asal, MD, PhD    04/23/2020, 1:41 PM  I, Heywood Footman, am acting as a Education administrator for Lequita Asal, MD.  I, Kerens Mike Gip, MD, have reviewed the above documentation for accuracy and completeness, and I agree with the above.

## 2020-04-23 ENCOUNTER — Other Ambulatory Visit: Payer: Self-pay

## 2020-04-23 ENCOUNTER — Inpatient Hospital Stay (HOSPITAL_BASED_OUTPATIENT_CLINIC_OR_DEPARTMENT_OTHER): Payer: BC Managed Care – PPO | Admitting: Hematology and Oncology

## 2020-04-23 ENCOUNTER — Inpatient Hospital Stay: Payer: BC Managed Care – PPO

## 2020-04-23 ENCOUNTER — Inpatient Hospital Stay: Payer: BC Managed Care – PPO | Attending: Hematology and Oncology

## 2020-04-23 ENCOUNTER — Encounter: Payer: Self-pay | Admitting: Hematology and Oncology

## 2020-04-23 VITALS — BP 145/92 | HR 71 | Temp 96.2°F | Resp 16 | Wt 290.7 lb

## 2020-04-23 VITALS — BP 144/96 | HR 85 | Resp 16

## 2020-04-23 DIAGNOSIS — D751 Secondary polycythemia: Secondary | ICD-10-CM

## 2020-04-23 LAB — CBC
HCT: 55.3 % — ABNORMAL HIGH (ref 39.0–52.0)
Hemoglobin: 15.4 g/dL (ref 13.0–17.0)
MCH: 20.6 pg — ABNORMAL LOW (ref 26.0–34.0)
MCHC: 27.8 g/dL — ABNORMAL LOW (ref 30.0–36.0)
MCV: 74.1 fL — ABNORMAL LOW (ref 80.0–100.0)
Platelets: 149 10*3/uL — ABNORMAL LOW (ref 150–400)
RBC: 7.46 MIL/uL — ABNORMAL HIGH (ref 4.22–5.81)
RDW: 21.3 % — ABNORMAL HIGH (ref 11.5–15.5)
WBC: 8.7 10*3/uL (ref 4.0–10.5)
nRBC: 0 % (ref 0.0–0.2)

## 2020-04-23 LAB — FERRITIN: Ferritin: 5 ng/mL — ABNORMAL LOW (ref 24–336)

## 2020-04-23 NOTE — Patient Instructions (Signed)

## 2020-04-23 NOTE — Progress Notes (Signed)
Patient has no complaints or questions at this time 

## 2020-05-07 ENCOUNTER — Inpatient Hospital Stay: Payer: BC Managed Care – PPO | Attending: Hematology and Oncology

## 2020-05-07 ENCOUNTER — Inpatient Hospital Stay: Payer: BC Managed Care – PPO

## 2020-05-07 ENCOUNTER — Other Ambulatory Visit: Payer: BC Managed Care – PPO

## 2020-05-07 ENCOUNTER — Other Ambulatory Visit: Payer: Self-pay

## 2020-05-07 VITALS — BP 128/71 | HR 68 | Temp 98.7°F | Resp 20

## 2020-05-07 DIAGNOSIS — D751 Secondary polycythemia: Secondary | ICD-10-CM | POA: Insufficient documentation

## 2020-05-07 LAB — CBC WITH DIFFERENTIAL/PLATELET
Abs Immature Granulocytes: 0.07 10*3/uL (ref 0.00–0.07)
Basophils Absolute: 0.1 10*3/uL (ref 0.0–0.1)
Basophils Relative: 1 %
Eosinophils Absolute: 0.1 10*3/uL (ref 0.0–0.5)
Eosinophils Relative: 1 %
HCT: 51.1 % (ref 39.0–52.0)
Hemoglobin: 14.5 g/dL (ref 13.0–17.0)
Immature Granulocytes: 1 %
Lymphocytes Relative: 16 %
Lymphs Abs: 1.4 10*3/uL (ref 0.7–4.0)
MCH: 20.8 pg — ABNORMAL LOW (ref 26.0–34.0)
MCHC: 28.4 g/dL — ABNORMAL LOW (ref 30.0–36.0)
MCV: 73.3 fL — ABNORMAL LOW (ref 80.0–100.0)
Monocytes Absolute: 0.8 10*3/uL (ref 0.1–1.0)
Monocytes Relative: 9 %
Neutro Abs: 6.2 10*3/uL (ref 1.7–7.7)
Neutrophils Relative %: 72 %
Platelets: 152 10*3/uL (ref 150–400)
RBC: 6.97 MIL/uL — ABNORMAL HIGH (ref 4.22–5.81)
RDW: 20.6 % — ABNORMAL HIGH (ref 11.5–15.5)
WBC: 8.7 10*3/uL (ref 4.0–10.5)
nRBC: 0 % (ref 0.0–0.2)

## 2020-05-08 LAB — CARBON MONOXIDE, BLOOD (PERFORMED AT REF LAB): Carbon Monoxide, Blood: 9.5 % — ABNORMAL HIGH (ref 0.0–3.6)

## 2020-05-08 LAB — ERYTHROPOIETIN: Erythropoietin: 90.9 m[IU]/mL — ABNORMAL HIGH (ref 2.6–18.5)

## 2020-05-08 LAB — TESTOSTERONE: Testosterone: 183 ng/dL — ABNORMAL LOW (ref 264–916)

## 2020-05-14 LAB — CALRETICULIN (CALR) MUTATION ANALYSIS

## 2020-05-14 LAB — MPL MUTATION ANALYSIS

## 2020-05-20 LAB — JAK2 EXONS 12-15

## 2020-05-20 LAB — JAK2  V617F QUAL. WITH REFLEX TO EXON 12: Reflex:: 15

## 2020-06-04 NOTE — Progress Notes (Signed)
[facility]  [address], [address] [place], [place] [address] Phone: [phone]  Fax: [phone]   Office Visit:  06/05/2020  Referring physician: Trinna Post, PA-C  Chief Complaint: Jason Bolton is a 64 y.o. 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  HPI: The patient was last seen in the hematology clinic on 04/23/2020. At that time, he felt "all right".  CPAP machine was working well.  He started smoking again.  He had a dull headache when his hematocrit was 51-52.  Exam was stable. Hematocrit was 55.3, hemoglobin 15.4, platelets 149,000, WBC 8,700. Ferritin was 5. He was counseled on smoking cessation. He received a phlebotomy.   Labs on 05/07/2020 revealed a hematocrit 51.1, hemoglobin 14.5, platelets 152,000, WBC 8,700. Erythropoietin 90.9. Testosterone 183. Carbon monoxide 9.5. JAK2, exon 12-15, CALR, and MPL were all negative.  He underwent a phlebotomy on 05/07/2020.  During the interim, he has been doing well. He smokes 3-4 cigarettes per day. For the past 3 months, he has had a slight, dull headaches. He notes that he is dehydrated and sometimes he "feels anemic." He has asthma and takes an allergy pill.  The patient is worried about an adrenal tumor because he has all of the symptoms he read about online. He has had lower back pain for 6-8 weeks and has an elevated erythropoietin. He has a family history of an adrenal tumor and his brother had to get his adrenal gland removed. The patient is agreeable to an abdomen and pelvis CT.   Past Medical History:  Diagnosis Date  . Allergy   . Hypercholesterolemia   . Hypertension   . 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 Polycythemia   . Sleep apnea     Past Surgical History:  Procedure Laterality Date  . CATARACT EXTRACTION Right 11/2013  . COLONOSCOPY  2006  . COLONOSCOPY WITH PROPOFOL N/A 10/19/2017   Procedure:  COLONOSCOPY WITH PROPOFOL;  Surgeon: [name], MD;  Location: [facility] ENDOSCOPY;  Service: Endoscopy;  Laterality: N/A;  . INGUINAL HERNIA REPAIR Right 1972  . MANDIBLE SURGERY  2005   [facility]    Family History  Problem Relation Age of Onset  . Diabetes Mother   . Congestive Heart Failure Mother   . Pancreatic cancer Father   . Healthy Sister   . Healthy Brother   . Healthy Brother   . Healthy Brother     Social History:  reports that he quit smoking about 4 years ago. His smoking use included cigarettes. He has a 30.00 pack-year smoking history. He has never used smokeless tobacco. He reports current alcohol use. He reports that he does not use drugs. He stopped smoking 2 years ago. He began smoking again 3 months ago.Patient is smoking 3-4 cigarettes again at this point. The patient was exposed to formaldehyde for 45 years and now his exposure is close to zero.He runs 2 funeral homes. 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  Allergies:  Allergies  Allergen Reactions  . Penicillins Other (See Comments)    unknown    Current Medications: Current Outpatient Medications  Medication Sig Dispense Refill  . aspirin 81 MG tablet Take 81 mg by mouth daily.    . Coenzyme Q10 50 MG CAPS CO ENZYME Q-10, 50MG  (Oral Capsule)  1 po qd for 0 days  Quantity: 30.00;  Refills: 0   Ordered :31-Aug-2010  Margarita Rana MD;  Started  31-Mar-2009 Active Comments: DX: 272.0    . naproxen (NAPROSYN) 500 MG tablet Take 500 mg by mouth daily as needed.     . OMEGA-3 FATTY ACIDS PO Take 1 tablet by mouth daily. FISH-EPA, 1000MG  (Oral Capsule)  2 po bid for 0 days  Quantity: 0.00;  Refills: 0   Ordered :31-Aug-2010  Margarita Rana MD;  Started 19-June-2007 Active Comments: DX: 272.0    . sildenafil (VIAGRA) 50 MG tablet TAKE 1 TABLET BY MOUTH AS NEEDED FOR ERECTILE DYSFUNCTION GENERIC EQUIVALENT FOR VIAGRA 8 tablet 3  . lisinopril-hydrochlorothiazide (ZESTORETIC) 20-12.5 MG tablet TAKE 1 TABLET BY MOUTH DAILY 90 tablet  0  . metoprolol tartrate (LOPRESSOR) 50 MG tablet TAKE 1 TABLET(50MG ) BY MOUTH 2 TIMES DAILY. 180 tablet 0  . montelukast (SINGULAIR) 10 MG tablet TAKE 1 TABLET BY MOUTH DAILY GENERIC EQUIVALENT FOR SINGULAIR 90 tablet 0  . simvastatin (ZOCOR) 20 MG tablet Take 1 tablet (20 mg total) by mouth at bedtime. 90 tablet 0  . tamsulosin (FLOMAX) 0.4 MG CAPS capsule TAKE 2 CAPSULES BY MOUTH DAILY GENERIC EQUIVALENT FOR FLOMAX 180 capsule 0   No current facility-administered medications for this visit.    Review of Systems  Constitutional: Negative.  Negative for chills, diaphoresis, fever, malaise/fatigue and weight loss.       Feels "alright".  HENT: Negative.  Negative for congestion, ear discharge, ear pain, hearing loss, nosebleeds, sinus pain, sore throat and tinnitus.   Eyes: Negative.  Negative for blurred vision.  Respiratory: Negative for cough, hemoptysis, sputum production and shortness of breath.        Sleep apnea on CPAP. Smoking 3-4 cigarettes per day.  Cardiovascular: Negative.  Negative for chest pain, palpitations and leg swelling.  Gastrointestinal: Negative.  Negative for abdominal pain, blood in stool, constipation, diarrhea, heartburn, melena, nausea and vomiting.       Notes that he is dehydrated.  Genitourinary: Negative.  Negative for dysuria, frequency, hematuria and urgency.  Musculoskeletal: Positive for back pain (lower, x 6-8 weeks). Negative for joint pain, myalgias and neck pain.  Skin: Negative.  Negative for itching and rash.  Neurological: Positive for headaches (dull headache for the past 3 months). Negative for dizziness, tingling, tremors, sensory change, speech change, focal weakness, seizures, loss of consciousness and weakness.  Endo/Heme/Allergies: Negative.  Negative for environmental allergies. Does not bruise/bleed easily.  Psychiatric/Behavioral: Negative.  Negative for depression, hallucinations, memory loss, substance abuse and suicidal ideas. The  patient is not nervous/anxious and does not have insomnia.   All other systems reviewed and are negative.  Performance status (ECOG): 0-1  Vital Signs: Blood pressure (!) 153/99, pulse 76, temperature (!) 97.2 F (36.2 C), temperature source Tympanic, resp. rate 18, height 6\' 1"  (1.854 m), weight 290 lb 7.3 oz (131.8 kg), SpO2 96 %.  Physical Exam Vitals and nursing note reviewed.  Constitutional:      General: He is not in acute distress.    Appearance: Normal appearance. He is well-developed. He is not diaphoretic.     Interventions: Face mask in place.  HENT:     Head: Normocephalic and atraumatic.     Comments: Gray hair.    Mouth/Throat:     Mouth: No oral lesions.  Eyes:     General: No scleral icterus.    Extraocular Movements: Extraocular movements intact.     Conjunctiva/sclera: Conjunctivae normal.     Pupils: Pupils are equal, round, and reactive to light.  Neck:     Vascular: No  JVD.  Cardiovascular:     Rate and Rhythm: Normal rate and regular rhythm.     Heart sounds: Normal heart sounds. No murmur heard.  No friction rub. No gallop.   Pulmonary:     Effort: Pulmonary effort is normal. No respiratory distress.     Breath sounds: Wheezing (intermittent soft) present. No rales.  Chest:     Chest wall: No tenderness.  Abdominal:     General: Bowel sounds are normal. There is no distension.     Palpations: Abdomen is soft. There is no mass.     Tenderness: There is no abdominal tenderness. There is no guarding or rebound.  Musculoskeletal:        General: No swelling or tenderness. Normal range of motion.     Cervical back: Normal range of motion and neck supple.  Lymphadenopathy:     Head:     Right side of head: No preauricular, posterior auricular or occipital adenopathy.     Left side of head: No preauricular, posterior auricular or occipital adenopathy.     Cervical: No cervical adenopathy.     Upper Body:     Right upper body: No supraclavicular or  axillary adenopathy.     Left upper body: No supraclavicular or axillary adenopathy.     Lower Body: No right inguinal adenopathy. No left inguinal adenopathy.  Skin:    General: Skin is warm and dry.     Coloration: Skin is not pale.     Findings: No bruising, erythema, lesion or rash.  Neurological:     Mental Status: He is alert and oriented to person, place, and time.  Psychiatric:        Behavior: Behavior normal.        Thought Content: Thought content normal.        Judgment: Judgment normal.    Appointment on 06/05/2020  Component Date Value Ref Range Status  . Ferritin 06/05/2020 7* 24 - 336 ng/mL Final   Performed at [address] [facility], [address] [place], [place] [address]  . WBC 06/05/2020 9.3  4.0 - 10.5 K/uL Final  . RBC 06/05/2020 7.07* 4.22 - 5.81 MIL/uL Final  . Hemoglobin 06/05/2020 14.6  13.0 - 17.0 g/dL Final  . HCT 06/05/2020 52.0  39 - 52 % Final  . MCV 06/05/2020 73.6* 80.0 - 100.0 fL Final  . MCH 06/05/2020 20.7* 26.0 - 34.0 pg Final  . MCHC 06/05/2020 28.1* 30.0 - 36.0 g/dL Final  . RDW 06/05/2020 20.5* 11.5 - 15.5 % Final  . Platelets 06/05/2020 146* 150 - 400 K/uL Final  . nRBC 06/05/2020 0.0  0.0 - 0.2 % Final  . Neutrophils Relative % 06/05/2020 70  % Final  . Neutro Abs 06/05/2020 6.5  1.7 - 7.7 K/uL Final  . Lymphocytes Relative 06/05/2020 17  % Final  . Lymphs Abs 06/05/2020 1.6  0.7 - 4.0 K/uL Final  . Monocytes Relative 06/05/2020 10  % Final  . Monocytes Absolute 06/05/2020 0.9  0 - 1 K/uL Final  . Eosinophils Relative 06/05/2020 1  % Final  . Eosinophils Absolute 06/05/2020 0.1  0 - 0 K/uL Final  . Basophils Relative 06/05/2020 1  % Final  . Basophils Absolute 06/05/2020 0.1  0 - 0 K/uL Final  . Immature Granulocytes 06/05/2020 1  % Final  . Abs Immature Granulocytes 06/05/2020 0.08* 0.00 - 0.07 K/uL Final   Performed at [facility], [address], [place], [place] [address]  Assessment:  Jason Bolton is a  64 y.o. male withsecondary polycythemiasince 2013/2014. Etiology is felt secondary to sleep apnea. He uses CPAP. He has a 30 pack year smoking history. He stopped smoking in 2016. He does not use testosterone. Initial labs ruled out polycythemia rubra vera. JAK2 V617F and exon 12 were negative on 10/17/2017. Erythropoietin level was normal. He is on a baby aspirin.  Labs on 05/07/2020 revealed an erythropoietin 90.9 (2.6 - 18.5) and carbon monoxide 9.5% (0-3.6%). Testosterone was 183 (low). JAK2, exon 12-15, CALR, and MPL were negative.  He undergoes phlebotomy to keep his hematocrit < 48. Last phlebotomy was on 05/07/2020.   Ferritinhas been followed: 6 on 05/12/2017 5 on 10/17/2017, 6 on 11/16/2017, 12 on 05/02/2018, 6 on 11/10/2018, 6 on 05/15/2019, 4 on 10/31/2019, and 5 on 04/23/2020.Epo levelwas 25.3 on 10/17/2017,34.6 on 05/02/2018, and 34.3 on 11/10/2018.  He received the [place] COVID-19 vaccine on 12/03/2019 and 12/24/2019  Symptomatically, he is doing well.  He is smoking 3-4 cigarettes/day.  Plan: 1.   Labs today: CBC with diff, ferritin. 2.   Secondary polycythemia Hematocrit 52.0.  Hemoglobin 14.6.  MCV 73.7.             Hematocrit goal is < 48.               He last underwent phlebotomy on 05/07/2020.               He notes a headache when his HCT is 51-52 and relieved after phlebotomy.  Hematocrit has been persistently elevated despite low iron stores.   Work-up for polycythemia:     He had an elevated carbon monoxide level of 9.1% on 10/17/2017.    EPO level is elevated (unclear significance).   Work-up repeated with his CPAP machine working well.     Carbon monoxide 9.5% (high) on 05/07/2020.    EPO level 90.9 on 05/07/2020.    JAK, Exon 12-15, CALR, MPL were negative.  Encourage smoking cessation.  Discuss plans for abdomen and pelvis CT. 3. Elevated erythropoietin level EPO level is slightly elevated and secondary  polycythemia. EPO level can be elevated in response to hypoxia or an erythropoietin secreting tumor. The degree of elevation does not reveal the underlying cause.  Work-up repeated as above. 4.   Health maintenance  Last colonoscopy was 10/19/2017.   Next colonoscopy in 3 years per patient.  Ferritin is 5.   Patient denies any bleeding.   Low ferritin is likely secondary to phlebotomies. 5.   Abdomen and pelvis CT on 06/12/2020. 6.   RTC after CT scan for MD assessment and review of imaging. 7.   RTC monthly x 6 for labs (CBC, ferritin) and +/- phlebotomy.  I discussed the assessment and treatment plan with the patient.  The patient was provided an opportunity to ask questions and all were answered.  The patient agreed with the plan and demonstrated an understanding of the instructions.  The patient was advised to call back if the symptoms worsen or if the condition fails to improve as anticipated.  I provided 19 minutes of face-to-face time during this this encounter and > 50% was spent counseling as documented under my assessment and plan. An additional 5 minutes were spent reviewing his chart (Epic and Care Everywhere) including notes, labs, and imaging studies.    Lequita Asal, MD, PhD    06/05/2020, 7:14 PM  I, De Burrs, am acting as a scribe for Lequita Asal, MD.  I, Algenis Ballin C. Mike Gip, MD, have reviewed the above documentation for accuracy and completeness, and I agree with the above.

## 2020-06-05 ENCOUNTER — Inpatient Hospital Stay (HOSPITAL_BASED_OUTPATIENT_CLINIC_OR_DEPARTMENT_OTHER): Payer: BC Managed Care – PPO | Admitting: Hematology and Oncology

## 2020-06-05 ENCOUNTER — Encounter: Payer: Self-pay | Admitting: Hematology and Oncology

## 2020-06-05 ENCOUNTER — Inpatient Hospital Stay: Payer: BC Managed Care – PPO

## 2020-06-05 ENCOUNTER — Other Ambulatory Visit: Payer: Self-pay

## 2020-06-05 ENCOUNTER — Inpatient Hospital Stay: Payer: BC Managed Care – PPO | Attending: Hematology and Oncology

## 2020-06-05 VITALS — BP 153/99 | HR 76 | Temp 97.2°F | Resp 18 | Ht 73.0 in | Wt 290.5 lb

## 2020-06-05 VITALS — BP 142/90 | HR 60 | Resp 18

## 2020-06-05 DIAGNOSIS — G4730 Sleep apnea, unspecified: Secondary | ICD-10-CM | POA: Diagnosis not present

## 2020-06-05 DIAGNOSIS — D751 Secondary polycythemia: Secondary | ICD-10-CM

## 2020-06-05 DIAGNOSIS — Z7982 Long term (current) use of aspirin: Secondary | ICD-10-CM | POA: Diagnosis not present

## 2020-06-05 DIAGNOSIS — F17210 Nicotine dependence, cigarettes, uncomplicated: Secondary | ICD-10-CM | POA: Insufficient documentation

## 2020-06-05 DIAGNOSIS — R718 Other abnormality of red blood cells: Secondary | ICD-10-CM

## 2020-06-05 DIAGNOSIS — E7800 Pure hypercholesterolemia, unspecified: Secondary | ICD-10-CM | POA: Diagnosis not present

## 2020-06-05 DIAGNOSIS — J45909 Unspecified asthma, uncomplicated: Secondary | ICD-10-CM | POA: Insufficient documentation

## 2020-06-05 DIAGNOSIS — I10 Essential (primary) hypertension: Secondary | ICD-10-CM | POA: Diagnosis not present

## 2020-06-05 DIAGNOSIS — Z79899 Other long term (current) drug therapy: Secondary | ICD-10-CM | POA: Insufficient documentation

## 2020-06-05 DIAGNOSIS — E860 Dehydration: Secondary | ICD-10-CM | POA: Insufficient documentation

## 2020-06-05 LAB — CBC WITH DIFFERENTIAL/PLATELET
Abs Immature Granulocytes: 0.08 10*3/uL — ABNORMAL HIGH (ref 0.00–0.07)
Basophils Absolute: 0.1 10*3/uL (ref 0.0–0.1)
Basophils Relative: 1 %
Eosinophils Absolute: 0.1 10*3/uL (ref 0.0–0.5)
Eosinophils Relative: 1 %
HCT: 52 % (ref 39.0–52.0)
Hemoglobin: 14.6 g/dL (ref 13.0–17.0)
Immature Granulocytes: 1 %
Lymphocytes Relative: 17 %
Lymphs Abs: 1.6 10*3/uL (ref 0.7–4.0)
MCH: 20.7 pg — ABNORMAL LOW (ref 26.0–34.0)
MCHC: 28.1 g/dL — ABNORMAL LOW (ref 30.0–36.0)
MCV: 73.6 fL — ABNORMAL LOW (ref 80.0–100.0)
Monocytes Absolute: 0.9 10*3/uL (ref 0.1–1.0)
Monocytes Relative: 10 %
Neutro Abs: 6.5 10*3/uL (ref 1.7–7.7)
Neutrophils Relative %: 70 %
Platelets: 146 10*3/uL — ABNORMAL LOW (ref 150–400)
RBC: 7.07 MIL/uL — ABNORMAL HIGH (ref 4.22–5.81)
RDW: 20.5 % — ABNORMAL HIGH (ref 11.5–15.5)
WBC: 9.3 10*3/uL (ref 4.0–10.5)
nRBC: 0 % (ref 0.0–0.2)

## 2020-06-05 LAB — FERRITIN: Ferritin: 7 ng/mL — ABNORMAL LOW (ref 24–336)

## 2020-06-05 NOTE — Progress Notes (Signed)
No new changes noted today 

## 2020-06-06 ENCOUNTER — Other Ambulatory Visit: Payer: Self-pay | Admitting: Physician Assistant

## 2020-06-06 DIAGNOSIS — E7800 Pure hypercholesterolemia, unspecified: Secondary | ICD-10-CM

## 2020-06-06 DIAGNOSIS — I10 Essential (primary) hypertension: Secondary | ICD-10-CM

## 2020-06-06 DIAGNOSIS — N400 Enlarged prostate without lower urinary tract symptoms: Secondary | ICD-10-CM

## 2020-06-06 DIAGNOSIS — J301 Allergic rhinitis due to pollen: Secondary | ICD-10-CM

## 2020-06-06 MED ORDER — SIMVASTATIN 20 MG PO TABS: 20.0000 mg | ORAL_TABLET | Freq: Every day | ORAL | 0 refills | Status: DC

## 2020-06-06 MED ORDER — TAMSULOSIN HCL 0.4 MG PO CAPS: ORAL_CAPSULE | ORAL | 0 refills | Status: DC

## 2020-06-06 NOTE — Addendum Note (Signed)
Addended by: Dimple Nanas on: 06/06/2020 03:03 PM   Modules accepted: Orders

## 2020-06-06 NOTE — Addendum Note (Signed)
Addended by: Dimple Nanas on: 06/06/2020 03:04 PM   Modules accepted: Orders

## 2020-06-10 DIAGNOSIS — R718 Other abnormality of red blood cells: Secondary | ICD-10-CM | POA: Insufficient documentation

## 2020-06-17 ENCOUNTER — Ambulatory Visit
Admission: RE | Admit: 2020-06-17 | Discharge: 2020-06-17 | Disposition: A | Discharge status: Home / Self Care | Payer: BC Managed Care – PPO | Source: Ambulatory Visit | Attending: Hematology and Oncology | Admitting: Hematology and Oncology

## 2020-06-17 ENCOUNTER — Other Ambulatory Visit: Payer: Self-pay

## 2020-06-17 DIAGNOSIS — D751 Secondary polycythemia: Secondary | ICD-10-CM | POA: Insufficient documentation

## 2020-06-17 DIAGNOSIS — K7689 Other specified diseases of liver: Secondary | ICD-10-CM | POA: Diagnosis not present

## 2020-06-17 DIAGNOSIS — K5730 Diverticulosis of large intestine without perforation or abscess without bleeding: Secondary | ICD-10-CM | POA: Diagnosis not present

## 2020-06-17 LAB — POCT I-STAT CREATININE: Creatinine, Ser: 1 mg/dL (ref 0.61–1.24)

## 2020-06-17 MED ORDER — IOHEXOL 300 MG/ML  SOLN
100.0000 mL | Freq: Once | INTRAMUSCULAR | Status: AC | PRN
  Administered 2020-06-17: 100 mL via INTRAVENOUS

## 2020-06-19 DIAGNOSIS — G4733 Obstructive sleep apnea (adult) (pediatric): Secondary | ICD-10-CM | POA: Diagnosis not present

## 2020-07-03 ENCOUNTER — Inpatient Hospital Stay: Payer: BC Managed Care – PPO

## 2020-07-07 ENCOUNTER — Inpatient Hospital Stay: Payer: BC Managed Care – PPO

## 2020-07-07 ENCOUNTER — Inpatient Hospital Stay: Payer: BC Managed Care – PPO | Attending: Hematology and Oncology

## 2020-07-07 ENCOUNTER — Other Ambulatory Visit: Payer: Self-pay

## 2020-07-07 DIAGNOSIS — D751 Secondary polycythemia: Secondary | ICD-10-CM | POA: Insufficient documentation

## 2020-07-07 LAB — CBC WITH DIFFERENTIAL/PLATELET
Abs Immature Granulocytes: 0.07 10*3/uL (ref 0.00–0.07)
Basophils Absolute: 0.1 10*3/uL (ref 0.0–0.1)
Basophils Relative: 1 %
Eosinophils Absolute: 0.1 10*3/uL (ref 0.0–0.5)
Eosinophils Relative: 2 %
HCT: 47.4 % (ref 39.0–52.0)
Hemoglobin: 13.5 g/dL (ref 13.0–17.0)
Immature Granulocytes: 1 %
Lymphocytes Relative: 15 %
Lymphs Abs: 1.2 10*3/uL (ref 0.7–4.0)
MCH: 20.7 pg — ABNORMAL LOW (ref 26.0–34.0)
MCHC: 28.5 g/dL — ABNORMAL LOW (ref 30.0–36.0)
MCV: 72.6 fL — ABNORMAL LOW (ref 80.0–100.0)
Monocytes Absolute: 0.8 10*3/uL (ref 0.1–1.0)
Monocytes Relative: 9 %
Neutro Abs: 6.2 10*3/uL (ref 1.7–7.7)
Neutrophils Relative %: 72 %
Platelets: 144 10*3/uL — ABNORMAL LOW (ref 150–400)
RBC: 6.53 MIL/uL — ABNORMAL HIGH (ref 4.22–5.81)
RDW: 19.9 % — ABNORMAL HIGH (ref 11.5–15.5)
WBC: 8.5 10*3/uL (ref 4.0–10.5)
nRBC: 0 % (ref 0.0–0.2)

## 2020-07-07 LAB — FERRITIN: Ferritin: 4 ng/mL — ABNORMAL LOW (ref 24–336)

## 2020-07-14 NOTE — Progress Notes (Signed)
Established patient visit   Patient: Jason Bolton   DOB: 03-Sep-1956   64 y.o. Male  MRN: [id number] Visit Date: 07/15/2020  Today's healthcare provider: Trinna Post, PA-C   Chief Complaint  Patient presents with  . Hyperlipidemia  . Hypertension  . Hyperglycemia   Subjective    HPI   Hypertension, follow-up  BP Readings from Last 3 Encounters:  07/15/20 132/87  06/05/20 (!) 142/90  06/05/20 (!) 153/99   Wt Readings from Last 3 Encounters:  07/15/20 294 lb (133.4 kg)  06/05/20 290 lb 7.3 oz (131.8 kg)  04/23/20 290 lb 10.8 oz (131.8 kg)     He was last seen for hypertension 6 months ago.  BP at that visit was 116/78. Management since that visit includes continue current medication. He reports good compliance with treatment. He is not having side effects.  He is not exercising. He is adherent to low salt diet.   Outside blood pressures are checked occasionally.  He does smoke.  Use of agents associated with hypertension: none.   Lipid/Cholesterol, follow-up  Last Lipid Panel: Lab Results  Component Value Date   CHOL 104 01/16/2020   LDLCALC 58 01/16/2020   HDL 33 (L) 01/16/2020   TRIG 58 01/16/2020    He was last seen for this 6 months ago.  Management since that visit includes continue current medication.  He reports good compliance with treatment. He is not having side effects.   Symptoms: No appetite changes No foot ulcerations  No chest pain No chest pressure/discomfort  No dyspnea No orthopnea  No fatigue No lower extremity edema  No palpitations No paroxysmal nocturnal dyspnea  No nausea No numbness or tingling of extremity  No polydipsia No polyuria  No speech difficulty No syncope   He is following a Regular diet. Current exercise: no regular exercise  Last metabolic panel Lab Results  Component Value Date   GLUCOSE 80 01/16/2020   NA 141 01/16/2020   K 5.1 01/16/2020   BUN 14 01/16/2020   CREATININE 1.00 06/17/2020     GFRNONAA 77 01/16/2020   GFRAA 89 01/16/2020   CALCIUM 9.5 01/16/2020   AST 20 01/16/2020   ALT 17 01/16/2020   The ASCVD Risk score Mikey Bussing DC Jr., et al., 2013) failed to calculate for the following reasons:   The valid total cholesterol range is 130 to 320 mg/dL  BPH: continues with flomax daily with good relief.   In the interim, his brother has been diagnosed with lymphoma, had COVID twice.   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   Patient reports intermittent r shoulder pain x several months. Was pulling a Conservation officer, nature. Does not have issues with overhead motions, back pocket motions, lifting. Will occasionally have pain with supination. Has taken naproxen with mild relief. Denies pain when sleeping on the shoulder. Denies weakness, numbness. Rates pain as moderate.      Medications: Outpatient Medications Prior to Visit  Medication Sig  . aspirin 81 MG tablet Take 81 mg by mouth daily.  . Coenzyme Q10 50 MG CAPS CO ENZYME Q-10, 50MG  (Oral Capsule)  1 po qd for 0 days  Quantity: 30.00;  Refills: 0   Ordered :31-Aug-2010  Margarita Rana MD;  Started 31-Mar-2009 Active Comments: DX: 272.0  . lisinopril-hydrochlorothiazide (ZESTORETIC) 20-12.5 MG tablet TAKE 1 TABLET BY MOUTH DAILY  . metoprolol tartrate (LOPRESSOR) 50 MG tablet TAKE 1 TABLET(50MG ) BY MOUTH 2 TIMES DAILY.  . montelukast (  SINGULAIR) 10 MG tablet TAKE 1 TABLET BY MOUTH DAILY GENERIC EQUIVALENT FOR SINGULAIR  . naproxen (NAPROSYN) 500 MG tablet Take 500 mg by mouth daily as needed.   . OMEGA-3 FATTY ACIDS PO Take 1 tablet by mouth daily. FISH-EPA, 1000MG  (Oral Capsule)  2 po bid for 0 days  Quantity: 0.00;  Refills: 0   Ordered :31-Aug-2010  Margarita Rana MD;  Started 19-June-2007 Active Comments: DX: 272.0  . sildenafil (VIAGRA) 50 MG tablet TAKE 1 TABLET BY MOUTH AS NEEDED FOR ERECTILE DYSFUNCTION GENERIC EQUIVALENT FOR VIAGRA  . simvastatin (ZOCOR) 20 MG tablet Take 1 tablet (20 mg total) by mouth at  bedtime.  . tamsulosin (FLOMAX) 0.4 MG CAPS capsule TAKE 2 CAPSULES BY MOUTH DAILY GENERIC EQUIVALENT FOR FLOMAX   No facility-administered medications prior to visit.        Objective    BP 132/87   Pulse 83   Temp 98.6 F (37 C)   Ht 6\' 1"  (1.854 m)   Wt 294 lb (133.4 kg)   BMI 38.79 kg/m    Physical Exam Constitutional:      Appearance: Normal appearance.  Cardiovascular:     Rate and Rhythm: Normal rate and regular rhythm.     Heart sounds: Normal heart sounds.  Pulmonary:     Effort: Pulmonary effort is normal.     Breath sounds: Normal breath sounds.  Musculoskeletal:     Right shoulder: Normal.     Left shoulder: Normal.  Skin:    General: Skin is warm and dry.  Neurological:     Mental Status: He is alert and oriented to person, place, and time. Mental status is at baseline.  Psychiatric:        Mood and Affect: Mood normal.        Behavior: Behavior normal.       Results for orders placed or performed in visit on 07/15/20  POCT glycosylated hemoglobin (Hb A1C)  Result Value Ref Range   Hemoglobin A1C 5.7 (A) 4.0 - 5.6 %   HbA1c POC (<> result, manual entry)     HbA1c, POC (prediabetic range)     HbA1c, POC (controlled diabetic range)      Assessment & Plan    1. Essential (primary) hypertension  Well controlled Continue current medications Recheck metabolic panel F/u in 6 months   2. Hypercholesterolemia without hypertriglyceridemia Previously well controlled Continue statin Repeat FLP and CMP Goal LDL < 100   3. Prediabetes  - POCT glycosylated hemoglobin (Hb A1C)  4. Benign prostatic hyperplasia, unspecified whether lower urinary tract symptoms present  Continue flomax.   5. Acute pain of right shoulder  Discussed sparing use of NSAIDs - meloxicam (MOBIC) 7.5 MG tablet; Take 1 tablet (7.5 mg total) by mouth daily.  Dispense: 30 tablet; Refill: 0  6. Need for vaccination against Streptococcus pneumoniae  Due for second  pneumovax.   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 Abdominal aortic aneurysm (AAA) without rupture (HCC)  Monitor with ultrasound in 3 years. Counseled on stopping smoking.     No follow-ups on file.      ITrinna Post, PA-C, have reviewed all documentation for this visit. The documentation on 07/15/20 for the exam, diagnosis, procedures, and orders are all accurate and complete.  The entirety of the information  documented in the History of Present Illness, Review of Systems and Physical Exam were personally obtained by me. Portions of this information were initially documented by rachelle presley, CMA and reviewed by me for thoroughness and accuracy.         Paulene Floor  [facility] [phone] (phone) [phone] (fax)  [place]

## 2020-07-15 ENCOUNTER — Encounter: Payer: Self-pay | Admitting: Physician Assistant

## 2020-07-15 ENCOUNTER — Other Ambulatory Visit: Payer: Self-pay

## 2020-07-15 ENCOUNTER — Ambulatory Visit: Payer: BC Managed Care – PPO | Admitting: Physician Assistant

## 2020-07-15 VITALS — BP 132/87 | HR 83 | Temp 98.6°F | Ht 73.0 in | Wt 294.0 lb

## 2020-07-15 DIAGNOSIS — N400 Enlarged prostate without lower urinary tract symptoms: Secondary | ICD-10-CM | POA: Diagnosis not present

## 2020-07-15 DIAGNOSIS — I714 Abdominal aortic aneurysm, without rupture, unspecified: Secondary | ICD-10-CM

## 2020-07-15 DIAGNOSIS — I10 Essential (primary) hypertension: Secondary | ICD-10-CM | POA: Diagnosis not present

## 2020-07-15 DIAGNOSIS — R7303 Prediabetes: Secondary | ICD-10-CM

## 2020-07-15 DIAGNOSIS — Z23 Encounter for immunization: Secondary | ICD-10-CM | POA: Diagnosis not present

## 2020-07-15 DIAGNOSIS — E7800 Pure hypercholesterolemia, unspecified: Secondary | ICD-10-CM

## 2020-07-15 DIAGNOSIS — M25511 Pain in right shoulder: Secondary | ICD-10-CM

## 2020-07-15 DIAGNOSIS — N529 Male erectile dysfunction, unspecified: Secondary | ICD-10-CM

## 2020-07-15 LAB — POCT GLYCOSYLATED HEMOGLOBIN (HGB A1C): Hemoglobin A1C: 5.7 % — AB (ref 4.0–5.6)

## 2020-07-15 MED ORDER — SILDENAFIL CITRATE 50 MG PO TABS: ORAL_TABLET | ORAL | 3 refills | Status: AC

## 2020-07-15 MED ORDER — MELOXICAM 7.5 MG PO TABS: 7.5000 mg | ORAL_TABLET | Freq: Every day | ORAL | 0 refills | Status: DC

## 2020-07-15 NOTE — Addendum Note (Signed)
Addended by: Wilburt Finlay on: 07/15/2020 08:48 AM   Modules accepted: Orders

## 2020-07-31 ENCOUNTER — Inpatient Hospital Stay: Payer: BC Managed Care – PPO

## 2020-08-11 ENCOUNTER — Other Ambulatory Visit: Payer: Self-pay

## 2020-08-11 ENCOUNTER — Inpatient Hospital Stay: Payer: BC Managed Care – PPO

## 2020-08-11 ENCOUNTER — Inpatient Hospital Stay: Payer: BC Managed Care – PPO | Attending: Hematology and Oncology

## 2020-08-11 ENCOUNTER — Other Ambulatory Visit: Payer: Self-pay | Admitting: Hematology and Oncology

## 2020-08-11 DIAGNOSIS — Z7982 Long term (current) use of aspirin: Secondary | ICD-10-CM | POA: Insufficient documentation

## 2020-08-11 DIAGNOSIS — D751 Secondary polycythemia: Secondary | ICD-10-CM | POA: Diagnosis not present

## 2020-08-11 DIAGNOSIS — I10 Essential (primary) hypertension: Secondary | ICD-10-CM | POA: Diagnosis not present

## 2020-08-11 DIAGNOSIS — G4730 Sleep apnea, unspecified: Secondary | ICD-10-CM | POA: Diagnosis not present

## 2020-08-11 DIAGNOSIS — Z79899 Other long term (current) drug therapy: Secondary | ICD-10-CM | POA: Diagnosis not present

## 2020-08-11 DIAGNOSIS — Z87891 Personal history of nicotine dependence: Secondary | ICD-10-CM | POA: Insufficient documentation

## 2020-08-11 LAB — CBC WITH DIFFERENTIAL/PLATELET
Abs Immature Granulocytes: 0.07 10*3/uL (ref 0.00–0.07)
Basophils Absolute: 0.1 10*3/uL (ref 0.0–0.1)
Basophils Relative: 1 %
Eosinophils Absolute: 0.2 10*3/uL (ref 0.0–0.5)
Eosinophils Relative: 2 %
HCT: 51.9 % (ref 39.0–52.0)
Hemoglobin: 14.6 g/dL (ref 13.0–17.0)
Immature Granulocytes: 1 %
Lymphocytes Relative: 14 %
Lymphs Abs: 1.4 10*3/uL (ref 0.7–4.0)
MCH: 20.3 pg — ABNORMAL LOW (ref 26.0–34.0)
MCHC: 28.1 g/dL — ABNORMAL LOW (ref 30.0–36.0)
MCV: 72.3 fL — ABNORMAL LOW (ref 80.0–100.0)
Monocytes Absolute: 0.9 10*3/uL (ref 0.1–1.0)
Monocytes Relative: 9 %
Neutro Abs: 7.5 10*3/uL (ref 1.7–7.7)
Neutrophils Relative %: 73 %
Platelets: 143 10*3/uL — ABNORMAL LOW (ref 150–400)
RBC: 7.18 MIL/uL — ABNORMAL HIGH (ref 4.22–5.81)
RDW: 20.6 % — ABNORMAL HIGH (ref 11.5–15.5)
WBC: 10 10*3/uL (ref 4.0–10.5)
nRBC: 0 % (ref 0.0–0.2)

## 2020-08-11 LAB — FERRITIN: Ferritin: 3 ng/mL — ABNORMAL LOW (ref 24–336)

## 2020-08-11 NOTE — Progress Notes (Signed)
Labs reviewed with MD and treatment team. Per MD pt does not need phlebotomy today. Pt updated and all questions answered at this time.   Jason Bolton [employer]

## 2020-08-28 ENCOUNTER — Inpatient Hospital Stay: Payer: BC Managed Care – PPO

## 2020-09-02 ENCOUNTER — Inpatient Hospital Stay: Payer: BC Managed Care – PPO

## 2020-09-02 ENCOUNTER — Other Ambulatory Visit: Payer: Self-pay

## 2020-09-02 ENCOUNTER — Inpatient Hospital Stay: Payer: BC Managed Care – PPO | Attending: Hematology and Oncology

## 2020-09-02 DIAGNOSIS — D751 Secondary polycythemia: Secondary | ICD-10-CM | POA: Insufficient documentation

## 2020-09-02 LAB — CBC WITH DIFFERENTIAL/PLATELET
Abs Immature Granulocytes: 0.09 10*3/uL — ABNORMAL HIGH (ref 0.00–0.07)
Basophils Absolute: 0.1 10*3/uL (ref 0.0–0.1)
Basophils Relative: 1 %
Eosinophils Absolute: 0.2 10*3/uL (ref 0.0–0.5)
Eosinophils Relative: 2 %
HCT: 52.9 % — ABNORMAL HIGH (ref 39.0–52.0)
Hemoglobin: 14.6 g/dL (ref 13.0–17.0)
Immature Granulocytes: 1 %
Lymphocytes Relative: 19 %
Lymphs Abs: 1.7 10*3/uL (ref 0.7–4.0)
MCH: 20.2 pg — ABNORMAL LOW (ref 26.0–34.0)
MCHC: 27.6 g/dL — ABNORMAL LOW (ref 30.0–36.0)
MCV: 73.3 fL — ABNORMAL LOW (ref 80.0–100.0)
Monocytes Absolute: 0.8 10*3/uL (ref 0.1–1.0)
Monocytes Relative: 9 %
Neutro Abs: 6.1 10*3/uL (ref 1.7–7.7)
Neutrophils Relative %: 68 %
Platelets: 152 10*3/uL (ref 150–400)
RBC: 7.22 MIL/uL — ABNORMAL HIGH (ref 4.22–5.81)
RDW: 20.7 % — ABNORMAL HIGH (ref 11.5–15.5)
WBC: 9 10*3/uL (ref 4.0–10.5)
nRBC: 0 % (ref 0.0–0.2)

## 2020-09-02 LAB — FERRITIN: Ferritin: 4 ng/mL — ABNORMAL LOW (ref 24–336)

## 2020-09-02 NOTE — Progress Notes (Unsigned)
Pt here for labs/ possible phleb. Reviewed labs. Hgb 14.6 . Less than 16.5 per parameters of MD  . No phleb today. Pt aware . Follow-up as scheduled .

## 2020-09-24 ENCOUNTER — Other Ambulatory Visit: Payer: Self-pay | Admitting: Physician Assistant

## 2020-09-24 DIAGNOSIS — M25511 Pain in right shoulder: Secondary | ICD-10-CM

## 2020-09-25 ENCOUNTER — Ambulatory Visit: Payer: Self-pay | Admitting: *Deleted

## 2020-09-25 ENCOUNTER — Telehealth: Payer: Self-pay

## 2020-09-25 ENCOUNTER — Other Ambulatory Visit: Payer: Self-pay | Admitting: Physician Assistant

## 2020-09-25 ENCOUNTER — Inpatient Hospital Stay: Payer: BC Managed Care – PPO

## 2020-09-25 ENCOUNTER — Other Ambulatory Visit: Payer: Self-pay

## 2020-09-25 VITALS — BP 158/91 | HR 89 | Temp 98.0°F | Resp 20

## 2020-09-25 DIAGNOSIS — D751 Secondary polycythemia: Secondary | ICD-10-CM

## 2020-09-25 DIAGNOSIS — I10 Essential (primary) hypertension: Secondary | ICD-10-CM

## 2020-09-25 DIAGNOSIS — N400 Enlarged prostate without lower urinary tract symptoms: Secondary | ICD-10-CM

## 2020-09-25 DIAGNOSIS — E7800 Pure hypercholesterolemia, unspecified: Secondary | ICD-10-CM

## 2020-09-25 LAB — CBC WITH DIFFERENTIAL/PLATELET
Abs Immature Granulocytes: 0.04 10*3/uL (ref 0.00–0.07)
Basophils Absolute: 0.1 10*3/uL (ref 0.0–0.1)
Basophils Relative: 1 %
Eosinophils Absolute: 0.1 10*3/uL (ref 0.0–0.5)
Eosinophils Relative: 2 %
HCT: 52.8 % — ABNORMAL HIGH (ref 39.0–52.0)
Hemoglobin: 14.7 g/dL (ref 13.0–17.0)
Immature Granulocytes: 1 %
Lymphocytes Relative: 13 %
Lymphs Abs: 1.1 10*3/uL (ref 0.7–4.0)
MCH: 20.6 pg — ABNORMAL LOW (ref 26.0–34.0)
MCHC: 27.8 g/dL — ABNORMAL LOW (ref 30.0–36.0)
MCV: 73.8 fL — ABNORMAL LOW (ref 80.0–100.0)
Monocytes Absolute: 0.8 10*3/uL (ref 0.1–1.0)
Monocytes Relative: 9 %
Neutro Abs: 6.4 10*3/uL (ref 1.7–7.7)
Neutrophils Relative %: 74 %
Platelets: 118 10*3/uL — ABNORMAL LOW (ref 150–400)
RBC: 7.15 MIL/uL — ABNORMAL HIGH (ref 4.22–5.81)
RDW: 22.3 % — ABNORMAL HIGH (ref 11.5–15.5)
WBC: 8.5 10*3/uL (ref 4.0–10.5)
nRBC: 0 % (ref 0.0–0.2)

## 2020-09-25 LAB — FERRITIN: Ferritin: 5 ng/mL — ABNORMAL LOW (ref 24–336)

## 2020-09-25 NOTE — Progress Notes (Signed)
1355: B/P 165/113, pt denies any s/s, no distress noted. Pt reports fatigue and headaches that has been occurring for at least 2 months, and is typically a sign that a phlebotomy is needed.  Dr. Mike Gip aware.  1404: B/P 168/107, Dr. Mike Gip aware.  1413: Per Vito Berger CMA, PCP has been contacted and the recommendation is to report to ER. Pt aware and states that he does not want to go to ER, that he has not taken his b/p medication today and that he would like to proceed with phlebotomy and the b/p will resolve.  1420: Per Dr. Mike Gip proceed with 500cc phlebotomy as scheduled and recheck b/p following procedure.  1423: phlebotomy started.  1447: Phlebotomy completed removing 500 using 20 gauge PIV in left hand. Pt tolerated procedure well. Pt continues to deny s/s and no distress noted.  1450: b/p 162/105,pt asymptomatic,  Dr. Mike Gip aware, per Dr. Mike Gip, pt is recommended to report to ER. Pt is asymptomatic and pt reports that he will take b/p medication as soon as he gets home and will monitor b/p and seek help if b/p continues to remain elevated or if pt becomes symptomatic.  1458: b/p 158/91, pt stable at discharge. Dr. Kem Parkinson office to contact pt with an appt for next week for labs.

## 2020-09-25 NOTE — Telephone Encounter (Signed)
[facility] faxed refill request for the following medications:  metoprolol tartrate (LOPRESSOR) 50 MG tablet lisinopril-hydrochlorothiazide (ZESTORETIC) 20-12.5 MG tablet tamsulosin (FLOMAX) 0.4 MG CAPS capsule simvastatin (ZOCOR) 20 MG tablet   Please advise. Thanks, [employer]

## 2020-09-25 NOTE — Telephone Encounter (Signed)
Jason Bolton from Dr [employer] office is calling to report elevated BP with patient- and headache- advised per protocol- ED.  Reason for Disposition . [0] Systolic BP  >= 388 OR Diastolic >= 828 AND [0] cardiac or neurologic symptoms (e.g., chest pain, difficulty breathing, unsteady gait, blurred vision)  Answer Assessment - Initial Assessment Questions 1. BLOOD PRESSURE: "What is the blood pressure?" "Did you take at least two measurements 5 minutes apart?"    165/113,168/107 2. ONSET: "When did you take your blood pressure?"     1:50 today 3. HOW: "How did you obtain the blood pressure?" (e.g., visiting nurse automatic home BP monitor)     Automatic cuff 4. HISTORY: "Do you have a history of high blood pressure?"     yes 5. MEDICATIONS: "Are you taking any medications for blood pressure?" "Have you missed any doses recently?"     Yes- unsure 6. OTHER SYMPTOMS: "Do you have any symptoms?" (e.g., headache, chest pain, blurred vision, difficulty breathing, weakness)     headache 7. PREGNANCY: "Is there any chance you are pregnant?" "When was your last menstrual period?"     n/a  Protocols used: BLOOD PRESSURE - HIGH-A-AH

## 2020-09-25 NOTE — Telephone Encounter (Signed)
I have spoken with the patient Jason Bolton) office to inform them that they patient b/p has been running high 165/113 and 168/107 they request that the patient go to the ED. I have informed Jason Bolton which Per Dr Mike Gip the patient declined.

## 2020-09-26 MED ORDER — METOPROLOL TARTRATE 50 MG PO TABS: ORAL_TABLET | ORAL | 0 refills | Status: DC

## 2020-09-26 MED ORDER — TAMSULOSIN HCL 0.4 MG PO CAPS: ORAL_CAPSULE | ORAL | 0 refills | Status: DC

## 2020-09-26 MED ORDER — SIMVASTATIN 20 MG PO TABS: 20.0000 mg | ORAL_TABLET | Freq: Every day | ORAL | 0 refills | Status: DC

## 2020-09-26 MED ORDER — LISINOPRIL-HYDROCHLOROTHIAZIDE 20-12.5 MG PO TABS: 1.0000 | ORAL_TABLET | Freq: Every day | ORAL | 0 refills | Status: DC

## 2020-09-26 NOTE — Telephone Encounter (Signed)
L.O.V. was on 07/15/2020 and upcoming visit on 01/14/2021.

## 2020-10-10 DIAGNOSIS — G4733 Obstructive sleep apnea (adult) (pediatric): Secondary | ICD-10-CM | POA: Diagnosis not present

## 2020-10-24 ENCOUNTER — Inpatient Hospital Stay: Payer: BC Managed Care – PPO

## 2020-10-28 ENCOUNTER — Inpatient Hospital Stay: Payer: BC Managed Care – PPO

## 2020-11-19 ENCOUNTER — Other Ambulatory Visit: Payer: Self-pay

## 2020-11-19 ENCOUNTER — Inpatient Hospital Stay: Payer: BC Managed Care – PPO

## 2020-11-19 ENCOUNTER — Inpatient Hospital Stay: Payer: BC Managed Care – PPO | Attending: Hematology and Oncology

## 2020-11-19 VITALS — BP 159/92 | HR 91 | Temp 98.9°F | Resp 20

## 2020-11-19 DIAGNOSIS — D751 Secondary polycythemia: Secondary | ICD-10-CM | POA: Diagnosis not present

## 2020-11-19 LAB — CBC WITH DIFFERENTIAL/PLATELET
Abs Immature Granulocytes: 0.06 10*3/uL (ref 0.00–0.07)
Basophils Absolute: 0.1 10*3/uL (ref 0.0–0.1)
Basophils Relative: 1 %
Eosinophils Absolute: 0.1 10*3/uL (ref 0.0–0.5)
Eosinophils Relative: 1 %
HCT: 55.9 % — ABNORMAL HIGH (ref 39.0–52.0)
Hemoglobin: 15.3 g/dL (ref 13.0–17.0)
Immature Granulocytes: 1 %
Lymphocytes Relative: 15 %
Lymphs Abs: 1.5 10*3/uL (ref 0.7–4.0)
MCH: 21.1 pg — ABNORMAL LOW (ref 26.0–34.0)
MCHC: 27.4 g/dL — ABNORMAL LOW (ref 30.0–36.0)
MCV: 77.1 fL — ABNORMAL LOW (ref 80.0–100.0)
Monocytes Absolute: 1.3 10*3/uL — ABNORMAL HIGH (ref 0.1–1.0)
Monocytes Relative: 13 %
Neutro Abs: 7 10*3/uL (ref 1.7–7.7)
Neutrophils Relative %: 69 %
Platelets: 130 10*3/uL — ABNORMAL LOW (ref 150–400)
RBC: 7.25 MIL/uL — ABNORMAL HIGH (ref 4.22–5.81)
RDW: 22.2 % — ABNORMAL HIGH (ref 11.5–15.5)
WBC: 9.9 10*3/uL (ref 4.0–10.5)
nRBC: 0 % (ref 0.0–0.2)

## 2020-11-19 LAB — FERRITIN: Ferritin: 9 ng/mL — ABNORMAL LOW (ref 24–336)

## 2020-11-19 NOTE — Progress Notes (Signed)
HCT > 48. Performed 500 ml phlebotomy per messaging with Dr Mike Gip. Pt tolerated procedure well. Pt had a headache for last several days; H/A was gone at time of discharge.

## 2020-11-19 NOTE — Patient Instructions (Signed)

## 2020-12-03 ENCOUNTER — Other Ambulatory Visit: Payer: Self-pay | Admitting: Physician Assistant

## 2020-12-03 DIAGNOSIS — I10 Essential (primary) hypertension: Secondary | ICD-10-CM

## 2020-12-15 ENCOUNTER — Telehealth: Payer: Self-pay | Admitting: Hematology and Oncology

## 2020-12-15 NOTE — Telephone Encounter (Signed)
12/15/2020 Called pt and informed him that appts on 12/16/20 have been r/s to 01/07/21 due to inclement weather. Pt confirmed these changes  SRW

## 2020-12-16 ENCOUNTER — Inpatient Hospital Stay: Payer: BC Managed Care – PPO

## 2020-12-16 ENCOUNTER — Inpatient Hospital Stay: Payer: BC Managed Care – PPO | Attending: Hematology and Oncology | Admitting: Hematology and Oncology

## 2020-12-25 ENCOUNTER — Other Ambulatory Visit: Payer: BC Managed Care – PPO

## 2020-12-25 ENCOUNTER — Ambulatory Visit: Payer: BC Managed Care – PPO | Admitting: Hematology and Oncology

## 2021-01-05 ENCOUNTER — Other Ambulatory Visit: Payer: Self-pay

## 2021-01-06 NOTE — Progress Notes (Signed)
[facility]  [address] [place], [place] [address] Phone: [phone]  Fax: [phone]   Office Visit: 01/07/21  Referring physician: Trinna Post, PA-C  Chief Complaint: Jason Bolton is a 65 y.o. 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  HPI: The patient was last seen in the hematology clinic on 06/05/2020. At that time, he was doing well.  He was smoking 3-4 cigarettes/day. Hematocrit was 52.0, hemoglobin 14.6, platelets 146,000, WBC 9,300. Ferritin was 7. He underwent therapeutic phlebotomy.  Abdomen and pelvis CT on 06/17/2020 revealed new borderline mild splenomegaly (13.0 cm, previously 11.9 cm). There was no abdominopelvic adenopathy. There was a stable 3.3 cm infrarenal abdominal aortic aneurysm. Recommendation was follow-up aortic ultrasound in 3 years. There was mild sigmoid diverticulosis, mild prostatomegaly, and aortic atherosclerosis.  Labs followed: 07/07/2020: Hematocrit 47.4, hemoglobin 13.5, platelets 144,000, WBC   8,500. Ferritin 4. 08/11/2020: Hematocrit 51.9, hemoglobin 14.6, platelets 143,000, WBC 10,000. Ferritin 3. 09/02/2020: Hematocrit 52.9, hemoglobin 14.6, platelets 152,000, WBC   9,000. Ferritin 4. 09/25/2020: Hematocrit 52.8, hemoglobin 14.7, platelets 118,000, WBC   8,500. Ferritin 5. 11/19/2020: Hematocrit 55.9, hemoglobin 15.3, platelets 130,000, WBC   9,900. Ferritin 9.  He underwent therapeutic phlebotomy on 09/15/2020 and 11/19/2020.  During the interim, he has been "ok." He still smokes 3-4 cigarettes per day. He works in [employer] and when he is there, he lives in an apartment that does not have a carbon monoxide monitor. His actual house is in [place], [place] does have a carbon monoxide monitor. He has sleep apnea and uses a CPAP. The CPAP was tested about 8-10 months ago. He feels refreshed in the mornings. He feels off-balance at times. He drinks fluids  About a month  after his last visit, he became very tired. He did some research and realized that he had all of the symptoms of anemia. He has shortness of breath on exertion, easy bruising, very yellow urine, and fatigue. He tested negative for COVID-19. These symptoms eventually resolved and he feels better now.  The patient has white coat hypertension. He does not have a pulmonologist.  Colonoscopy on 10/19/2017 revealed diverticulosis in the sigmoid colon. There was one 10 mm polyp in the mid ascending colon (tubular adenoma).   Past Medical History:  Diagnosis Date  . Allergy   . Hypercholesterolemia   . Hypertension   . 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 Polycythemia   . Sleep apnea     Past Surgical History:  Procedure Laterality Date  . CATARACT EXTRACTION Right 11/2013  . COLONOSCOPY  2006  . COLONOSCOPY WITH PROPOFOL N/A 10/19/2017   Procedure: COLONOSCOPY WITH PROPOFOL;  Surgeon: [name], MD;  Location: [facility] ENDOSCOPY;  Service: Endoscopy;  Laterality: N/A;  . INGUINAL HERNIA REPAIR Right 1972  . MANDIBLE SURGERY  2005   [facility]    Family History  Problem Relation Age of Onset  . Diabetes Mother   . Congestive Heart Failure Mother   . Pancreatic cancer Father   . Healthy Sister   . Healthy Brother   . Healthy Brother   . Healthy Brother     Social History:  reports that he quit smoking about 5 years ago. His smoking use included cigarettes. He has a 30.00 pack-year smoking history. He has never used smokeless tobacco. He reports current alcohol use. He reports that he does not use drugs. He stopped smoking 2 years  ago. He began smoking again 3 months ago.Patient is smoking 3-4 cigarettes again at this point. The patient was exposed to formaldehyde for 45 years and now his exposure is close to zero.He runs 2 funeral homes. 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  Allergies:  Allergies  Allergen Reactions  . Penicillins Other (See Comments)     unknown    Current Medications: Current Outpatient Medications  Medication Sig Dispense Refill  . aspirin 81 MG tablet Take 81 mg by mouth daily.    . Coenzyme Q10 50 MG CAPS CO ENZYME Q-10, 50MG  (Oral Capsule)  1 po qd for 0 days  Quantity: 30.00;  Refills: 0   Ordered :31-Aug-2010  Margarita Rana MD;  Started 31-Mar-2009 Active Comments: DX: 272.0    . lisinopril-hydrochlorothiazide (ZESTORETIC) 20-12.5 MG tablet TAKE 1 TABLET DAILY 90 tablet 0  . meloxicam (MOBIC) 7.5 MG tablet TAKE 1 TABLET DAILY 90 tablet 1  . metoprolol tartrate (LOPRESSOR) 50 MG tablet TAKE 1 TABLET(50MG ) BY MOUTH 2 TIMES DAILY. 180 tablet 0  . montelukast (SINGULAIR) 10 MG tablet TAKE 1 TABLET BY MOUTH DAILY GENERIC EQUIVALENT FOR SINGULAIR 90 tablet 0  . naproxen (NAPROSYN) 500 MG tablet Take 500 mg by mouth daily as needed.     . OMEGA-3 FATTY ACIDS PO Take 1 tablet by mouth daily. FISH-EPA, 1000MG  (Oral Capsule)  2 po bid for 0 days  Quantity: 0.00;  Refills: 0   Ordered :31-Aug-2010  Margarita Rana MD;  Started 19-June-2007 Active Comments: DX: 272.0    . sildenafil (VIAGRA) 50 MG tablet TAKE 1 TABLET BY MOUTH AS NEEDED FOR ERECTILE DYSFUNCTION GENERIC EQUIVALENT FOR VIAGRA 8 tablet 3  . simvastatin (ZOCOR) 20 MG tablet Take 1 tablet (20 mg total) by mouth at bedtime. 90 tablet 0  . tamsulosin (FLOMAX) 0.4 MG CAPS capsule TAKE 2 CAPSULES BY MOUTH DAILY GENERIC EQUIVALENT FOR FLOMAX 180 capsule 0   No current facility-administered medications for this visit.    Review of Systems  Constitutional: Negative for chills, diaphoresis, fever, malaise/fatigue and weight loss (up 6 lbs).       Feels "okay."  HENT: Negative for congestion, ear discharge, ear pain, hearing loss, nosebleeds, sinus pain, sore throat and tinnitus.   Eyes: Negative for blurred vision.  Respiratory: Negative for cough, hemoptysis, sputum production and shortness of breath.        Sleep apnea on CPAP. Smoking 3-4 cigarettes per day.   Cardiovascular: Negative for chest pain, palpitations and leg swelling.  Gastrointestinal: Negative for abdominal pain, blood in stool, constipation, diarrhea, heartburn, melena, nausea and vomiting.  Genitourinary: Negative for dysuria, frequency, hematuria and urgency.  Musculoskeletal: Negative for back pain, joint pain, myalgias and neck pain.  Skin: Negative for itching and rash.  Neurological: Negative for dizziness, tingling, sensory change, weakness and headaches.       Off balance at times.  Endo/Heme/Allergies: Does not bruise/bleed easily.  Psychiatric/Behavioral: Negative for depression and memory loss. The patient is not nervous/anxious and does not have insomnia.   All other systems reviewed and are negative.  Performance status (ECOG): 0  Vital Signs: Blood pressure (!) 159/91, pulse 68, temperature 98.2 F (36.8 C), temperature source Tympanic, resp. rate 18, weight 296 lb 1.2 oz (134.3 kg), SpO2 93 %.  Physical Exam Vitals and nursing note reviewed.  Constitutional:      General: He is not in acute distress.    Appearance: He is not diaphoretic.  HENT:     Head: Normocephalic and atraumatic.  Comments: Pearline Cables hair    Mouth/Throat:     Mouth: Mucous membranes are dry.     Pharynx: Oropharynx is clear.  Eyes:     General: No scleral icterus.    Extraocular Movements: Extraocular movements intact.     Conjunctiva/sclera: Conjunctivae normal.     Pupils: Pupils are equal, round, and reactive to light.  Cardiovascular:     Rate and Rhythm: Normal rate and regular rhythm.     Heart sounds: Normal heart sounds. No murmur heard.   Pulmonary:     Effort: Pulmonary effort is normal. No respiratory distress.     Breath sounds: Wheezing (every now and then) present. No rales.  Chest:     Chest wall: No tenderness.  Breasts:     Right: No axillary adenopathy or supraclavicular adenopathy.     Left: No axillary adenopathy or supraclavicular adenopathy.     Abdominal:     General: Bowel sounds are normal. There is no distension.     Palpations: Abdomen is soft. There is no mass.     Tenderness: There is no abdominal tenderness. There is no guarding or rebound.  Musculoskeletal:        General: No swelling or tenderness. Normal range of motion.     Cervical back: Normal range of motion and neck supple.     Right lower leg: Edema (chronic) present.     Left lower leg: Edema (chronic) present.  Lymphadenopathy:     Head:     Right side of head: No preauricular, posterior auricular or occipital adenopathy.     Left side of head: No preauricular, posterior auricular or occipital adenopathy.     Cervical: No cervical adenopathy.     Upper Body:     Right upper body: No supraclavicular or axillary adenopathy.     Left upper body: No supraclavicular or axillary adenopathy.     Lower Body: No right inguinal adenopathy. No left inguinal adenopathy.  Skin:    General: Skin is warm and dry.  Neurological:     Mental Status: He is alert and oriented to person, place, and time.  Psychiatric:        Behavior: Behavior normal.        Thought Content: Thought content normal.        Judgment: Judgment normal.    Appointment on 01/07/2021  Component Date Value Ref Range Status  . Sodium 01/07/2021 138  135 - 145 mmol/L Final  . Potassium 01/07/2021 4.1  3.5 - 5.1 mmol/L Final  . Chloride 01/07/2021 93* 98 - 111 mmol/L Final  . CO2 01/07/2021 38* 22 - 32 mmol/L Final  . Glucose, Bld 01/07/2021 102* 70 - 99 mg/dL Final   Glucose reference range applies only to samples taken after fasting for at least 8 hours.  . BUN 01/07/2021 18  8 - 23 mg/dL Final  . Creatinine, Ser 01/07/2021 0.96  0.61 - 1.24 mg/dL Final  . Calcium 01/07/2021 9.2  8.9 - 10.3 mg/dL Final  . Total Protein 01/07/2021 7.1  6.5 - 8.1 g/dL Final  . Albumin 01/07/2021 3.5  3.5 - 5.0 g/dL Final  . AST 01/07/2021 18  15 - 41 U/L Final  . ALT 01/07/2021 16  0 - 44 U/L Final  .  Alkaline Phosphatase 01/07/2021 65  38 - 126 U/L Final  . Total Bilirubin 01/07/2021 0.8  0.3 - 1.2 mg/dL Final  . GFR, Estimated 01/07/2021 >60  >60 mL/min Final   Comment: (  NOTE) Calculated using the CKD-EPI Creatinine Equation (2021)   . Anion gap 01/07/2021 7  5 - 15 Final   Performed at [facility], [address], [place], [place] [address]  . WBC 01/07/2021 8.8  4.0 - 10.5 K/uL Final  . RBC 01/07/2021 7.39* 4.22 - 5.81 MIL/uL Final  . Hemoglobin 01/07/2021 15.3  13.0 - 17.0 g/dL Final  . HCT 01/07/2021 56.9* 39.0 - 52.0 % Final  . MCV 01/07/2021 77.0* 80.0 - 100.0 fL Final  . MCH 01/07/2021 20.7* 26.0 - 34.0 pg Final  . MCHC 01/07/2021 26.9* 30.0 - 36.0 g/dL Final  . RDW 01/07/2021 22.3* 11.5 - 15.5 % Final  . Platelets 01/07/2021 134* 150 - 400 K/uL Final  . nRBC 01/07/2021 0.0  0.0 - 0.2 % Final  . Neutrophils Relative % 01/07/2021 75  % Final  . Neutro Abs 01/07/2021 6.7  1.7 - 7.7 K/uL Final  . Lymphocytes Relative 01/07/2021 12  % Final  . Lymphs Abs 01/07/2021 1.1  0.7 - 4.0 K/uL Final  . Monocytes Relative 01/07/2021 10  % Final  . Monocytes Absolute 01/07/2021 0.9  0.1 - 1.0 K/uL Final  . Eosinophils Relative 01/07/2021 1  % Final  . Eosinophils Absolute 01/07/2021 0.1  0.0 - 0.5 K/uL Final  . Basophils Relative 01/07/2021 1  % Final  . Basophils Absolute 01/07/2021 0.1  0.0 - 0.1 K/uL Final  . Immature Granulocytes 01/07/2021 1  % Final  . Abs Immature Granulocytes 01/07/2021 0.06  0.00 - 0.07 K/uL Final   Performed at [facility], [address], [place], [place] [address]    Assessment:  Jason Bolton is a 65 y.o. male withsecondary polycythemiasince 2013/2014. Etiology is felt secondary to sleep apnea. He uses CPAP. He has a 30 pack year smoking history. He stopped smoking in 2016. He does not use testosterone. Initial labs ruled out polycythemia rubra vera. JAK2 V617F and exon 12 were negative on 10/17/2017. Erythropoietin  level was normal. He is on a baby aspirin.  Labs on 05/07/2020 revealed an erythropoietin 90.9 (2.6 - 18.5) and carbon monoxide 9.5% (0-3.6%). Testosterone was 183 (low). JAK2, exon 12-15, CALR, and MPL were negative.  He undergoes phlebotomy to keep his hematocrit < 48. Last phlebotomy was on 11/19/2020.   Ferritinhas been followed: 6 on 05/12/2017 5 on 10/17/2017, 6 on 11/16/2017, 12 on 05/02/2018, 6 on 11/10/2018, 6 on 05/15/2019, 4 on 10/31/2019, and 5 on 04/23/2020.Epo levelwas 25.3 on 10/17/2017,34.6 on 05/02/2018, and 34.3 on 11/10/2018.  Abdomen and pelvis CT on 06/17/2020 revealed new borderline mild splenomegaly (13.0 cm, previously 11.9 cm). There was no abdominopelvic adenopathy. There was a stable 3.3 cm infrarenal abdominal aortic aneurysm. Recommendation was follow-up aortic ultrasound in 3 years. There was mild sigmoid diverticulosis, mild prostatomegaly, and aortic atherosclerosis.  He received the [employer] COVID-19 vaccine on 12/03/2019 and 12/24/2019  Symptomatically, he feels "ok." He smokes 3-4 cigarettes per day.  His apartment in [place], [place] does not have a carbon monoxide monitor. He has sleep apnea and uses a CPAP. The CPAP was tested about 8-10 months ago. He feels refreshed in the mornings. He feels off-balance at times. Exam is stable.  Plan: 1.   Labs today: CBC, ferritin. 2.   Secondary polycythemia Hematocrit 56.9.  Hemoglobin 15.3.  MCV 77.0.             Hematocrit goal is < 48.    Hemoglobin goal is < 16.  He last underwent phlebotomy on 11/19/2020.               He notes a headache when his HCT is 51-52 and relieved after phlebotomy.  Hematocrit has been persistently elevated despite low iron stores.   Work-up for polycythemia:     He had an elevated carbon monoxide level of 9.1% on 10/17/2017.    EPO level is elevated (unclear significance).   Work-up repeated with his CPAP machine working well.     Carbon monoxide 9.5%  (high) on 05/07/2020.     Suggest carbon monoxide monitoring in apartment in [place].    EPO level 90.9 on 05/07/2020.    JAK, Exon 12-15, CALR, MPL were negative.  Continue to encourage smoking cessation.  Review interval abdomen and pelvis CT on 06/17/2020.  It is personally reviewed.  Agree with radiology interpretation.   He has borderline mild splenomegaly (13 cm).  Check chest x-ray today. 3. Elevated erythropoietin level EPO level is slightly elevated in secondary polycythemia. EPO level can be elevated in response to hypoxia or an erythropoietin secreting tumor. The degree of elevation does not reveal the underlying cause.   Differential diagnosis includes HCC, renal cell carcinoma, hemangioblastoma, and pheochromocytoma.  Review abdomen and pelvis CT scan as above. 4.   Health maintenance  Last colonoscopy was 10/19/2017.   Next colonoscopy in 3 years per patient.  Ferritin is 5.  Patient denies any bleeding.   Low ferritin likely secondary to phlebotomies. 5.   CXR(PA and lateral) today. 6.   No phlebotomy today. 7.   Present at hematology conference. 8.   RTC monthly x 6 for labs (HCT/Hgb) and +/- phlebotomy (new parameters). 9.   RTC in 2 months for MD assessment and labs (CBC with diff).  I discussed the assessment and treatment plan with the patient.  The patient was provided an opportunity to ask questions and all were answered.  The patient agreed with the plan and demonstrated an understanding of the instructions.  The patient was advised to call back if the symptoms worsen or if the condition fails to improve as anticipated.  I provided 27 minutes of face-to-face time during this this encounter and > 50% was spent counseling as documented under my assessment and plan.  An additional 10 minutes were spent reviewing his chart (Epic and Care Everywhere) including notes, labs, and imaging studies.    Lequita Asal, MD, PhD     01/07/2021, 12:00 PM   I, De Burrs, am acting as a Education administrator for Lequita Asal, MD.  I, Gayle Mill Mike Gip, MD, have reviewed the above documentation for accuracy and completeness, and I agree with the above.

## 2021-01-07 ENCOUNTER — Other Ambulatory Visit: Payer: Self-pay

## 2021-01-07 ENCOUNTER — Other Ambulatory Visit: Payer: Self-pay | Admitting: Hematology and Oncology

## 2021-01-07 ENCOUNTER — Inpatient Hospital Stay (HOSPITAL_BASED_OUTPATIENT_CLINIC_OR_DEPARTMENT_OTHER): Payer: BC Managed Care – PPO | Admitting: Hematology and Oncology

## 2021-01-07 ENCOUNTER — Inpatient Hospital Stay: Payer: BC Managed Care – PPO | Attending: Hematology and Oncology

## 2021-01-07 ENCOUNTER — Encounter: Payer: Self-pay | Admitting: Hematology and Oncology

## 2021-01-07 ENCOUNTER — Inpatient Hospital Stay: Payer: BC Managed Care – PPO

## 2021-01-07 ENCOUNTER — Ambulatory Visit
Admission: RE | Admit: 2021-01-07 | Discharge: 2021-01-07 | Disposition: A | Discharge status: Home / Self Care | Payer: BC Managed Care – PPO | Attending: Hematology and Oncology | Admitting: Hematology and Oncology

## 2021-01-07 ENCOUNTER — Ambulatory Visit
Admission: RE | Admit: 2021-01-07 | Discharge: 2021-01-07 | Disposition: A | Discharge status: Home / Self Care | Payer: BC Managed Care – PPO | Source: Ambulatory Visit | Attending: Hematology and Oncology | Admitting: Hematology and Oncology

## 2021-01-07 VITALS — BP 159/91 | HR 68 | Temp 98.2°F | Resp 18 | Wt 296.1 lb

## 2021-01-07 DIAGNOSIS — J189 Pneumonia, unspecified organism: Secondary | ICD-10-CM | POA: Diagnosis not present

## 2021-01-07 DIAGNOSIS — R161 Splenomegaly, not elsewhere classified: Secondary | ICD-10-CM | POA: Insufficient documentation

## 2021-01-07 DIAGNOSIS — Z79899 Other long term (current) drug therapy: Secondary | ICD-10-CM | POA: Insufficient documentation

## 2021-01-07 DIAGNOSIS — R5383 Other fatigue: Secondary | ICD-10-CM | POA: Diagnosis not present

## 2021-01-07 DIAGNOSIS — D751 Secondary polycythemia: Secondary | ICD-10-CM

## 2021-01-07 DIAGNOSIS — R718 Other abnormality of red blood cells: Secondary | ICD-10-CM | POA: Diagnosis not present

## 2021-01-07 DIAGNOSIS — I714 Abdominal aortic aneurysm, without rupture: Secondary | ICD-10-CM | POA: Diagnosis not present

## 2021-01-07 DIAGNOSIS — N400 Enlarged prostate without lower urinary tract symptoms: Secondary | ICD-10-CM | POA: Insufficient documentation

## 2021-01-07 DIAGNOSIS — E7800 Pure hypercholesterolemia, unspecified: Secondary | ICD-10-CM | POA: Diagnosis not present

## 2021-01-07 DIAGNOSIS — K5730 Diverticulosis of large intestine without perforation or abscess without bleeding: Secondary | ICD-10-CM | POA: Diagnosis not present

## 2021-01-07 DIAGNOSIS — Z800 Family history of malignant neoplasm of digestive organs: Secondary | ICD-10-CM | POA: Diagnosis not present

## 2021-01-07 DIAGNOSIS — G4730 Sleep apnea, unspecified: Secondary | ICD-10-CM | POA: Insufficient documentation

## 2021-01-07 DIAGNOSIS — I10 Essential (primary) hypertension: Secondary | ICD-10-CM | POA: Diagnosis not present

## 2021-01-07 DIAGNOSIS — I700 Atherosclerosis of aorta: Secondary | ICD-10-CM | POA: Insufficient documentation

## 2021-01-07 DIAGNOSIS — R0602 Shortness of breath: Secondary | ICD-10-CM | POA: Diagnosis not present

## 2021-01-07 DIAGNOSIS — Z7982 Long term (current) use of aspirin: Secondary | ICD-10-CM | POA: Diagnosis not present

## 2021-01-07 LAB — CBC WITH DIFFERENTIAL/PLATELET
Abs Immature Granulocytes: 0.06 10*3/uL (ref 0.00–0.07)
Basophils Absolute: 0.1 10*3/uL (ref 0.0–0.1)
Basophils Relative: 1 %
Eosinophils Absolute: 0.1 10*3/uL (ref 0.0–0.5)
Eosinophils Relative: 1 %
HCT: 56.9 % — ABNORMAL HIGH (ref 39.0–52.0)
Hemoglobin: 15.3 g/dL (ref 13.0–17.0)
Immature Granulocytes: 1 %
Lymphocytes Relative: 12 %
Lymphs Abs: 1.1 10*3/uL (ref 0.7–4.0)
MCH: 20.7 pg — ABNORMAL LOW (ref 26.0–34.0)
MCHC: 26.9 g/dL — ABNORMAL LOW (ref 30.0–36.0)
MCV: 77 fL — ABNORMAL LOW (ref 80.0–100.0)
Monocytes Absolute: 0.9 10*3/uL (ref 0.1–1.0)
Monocytes Relative: 10 %
Neutro Abs: 6.7 10*3/uL (ref 1.7–7.7)
Neutrophils Relative %: 75 %
Platelets: 134 10*3/uL — ABNORMAL LOW (ref 150–400)
RBC: 7.39 MIL/uL — ABNORMAL HIGH (ref 4.22–5.81)
RDW: 22.3 % — ABNORMAL HIGH (ref 11.5–15.5)
WBC: 8.8 10*3/uL (ref 4.0–10.5)
nRBC: 0 % (ref 0.0–0.2)

## 2021-01-07 LAB — COMPREHENSIVE METABOLIC PANEL
ALT: 16 U/L (ref 0–44)
AST: 18 U/L (ref 15–41)
Albumin: 3.5 g/dL (ref 3.5–5.0)
Alkaline Phosphatase: 65 U/L (ref 38–126)
Anion gap: 7 (ref 5–15)
BUN: 18 mg/dL (ref 8–23)
CO2: 38 mmol/L — ABNORMAL HIGH (ref 22–32)
Calcium: 9.2 mg/dL (ref 8.9–10.3)
Chloride: 93 mmol/L — ABNORMAL LOW (ref 98–111)
Creatinine, Ser: 0.96 mg/dL (ref 0.61–1.24)
GFR, Estimated: >60 mL/min (ref >60)
Glucose, Bld: 102 mg/dL — ABNORMAL HIGH (ref 70–99)
Potassium: 4.1 mmol/L (ref 3.5–5.1)
Sodium: 138 mmol/L (ref 135–145)
Total Bilirubin: 0.8 mg/dL (ref 0.3–1.2)
Total Protein: 7.1 g/dL (ref 6.5–8.1)

## 2021-01-07 LAB — FERRITIN: Ferritin: 8 ng/mL — ABNORMAL LOW (ref 24–336)

## 2021-01-07 NOTE — Progress Notes (Signed)
Patient stated that he has been  Feeling fatigue and has some unbalance like he doesn't have much  energy

## 2021-01-08 LAB — CARBON MONOXIDE, BLOOD (PERFORMED AT REF LAB): Carbon Monoxide, Blood: 11 % — ABNORMAL HIGH (ref 0.0–3.6)

## 2021-01-08 LAB — ERYTHROPOIETIN: Erythropoietin: 269.9 m[IU]/mL — ABNORMAL HIGH (ref 2.6–18.5)

## 2021-01-13 NOTE — Progress Notes (Signed)
Established patient visit   Patient: Jason Bolton   DOB: 1956-09-09   65 y.o. Male  MRN: [id number] Visit Date: 01/14/2021  Today's healthcare provider: Trinna Post, PA-C   Chief Complaint  Patient presents with  . Hypertension  . Hyperlipidemia  . Prediabetes  I,Deondria Puryear M Donnovan Stamour,acting as a scribe for [employer], PA-C.,have documented all relevant documentation on the behalf of Trinna Post, PA-C,as directed by  Trinna Post, PA-C while in the presence of Trinna Post, PA-C.  Subjective    HPI  Hypertension, follow-up  BP Readings from Last 3 Encounters:  01/15/21 (!) 144/94  01/14/21 (!) 156/102  01/07/21 (!) 159/91   Wt Readings from Last 3 Encounters:  01/15/21 287 lb (130.2 kg)  01/14/21 291 lb 8 oz (132.2 kg)  01/07/21 296 lb 1.2 oz (134.3 kg)     He was last seen for hypertension 6 months ago.  BP at that visit was 158/91. Management since that visit includes no medication changes.  He reports good compliance with treatment. He is not having side effects.  He is following a Regular diet. He is not exercising. He does not smoke.  Use of agents associated with hypertension: none.   Outside blood pressures are not being checked. Symptoms: No chest pain No chest pressure  No palpitations No syncope  No dyspnea No orthopnea  No paroxysmal nocturnal dyspnea No lower extremity edema   Pertinent labs: Lab Results  Component Value Date   CHOL 122 01/14/2021   HDL 35 (L) 01/14/2021   LDLCALC 72 01/14/2021   TRIG 73 01/14/2021   CHOLHDL 3.5 01/14/2021   Lab Results  Component Value Date   NA 142 01/14/2021   K 4.4 01/14/2021   CREATININE 0.83 01/14/2021   GFRNONAA 91 01/14/2021   GFRAA 105 01/14/2021   GLUCOSE 100 (H) 01/14/2021     The ASCVD Risk score ([place]off DC Jr., et al., 2013) failed to calculate for the following reasons:   The valid total cholesterol range is 130 to 320 mg/dL    --------------------------------------------------------------------------------------------------- Lipid/Cholesterol, Follow-up  Last lipid panel Other pertinent labs  Lab Results  Component Value Date   CHOL 122 01/14/2021   HDL 35 (L) 01/14/2021   LDLCALC 72 01/14/2021   TRIG 73 01/14/2021   CHOLHDL 3.5 01/14/2021   Lab Results  Component Value Date   ALT 16 01/14/2021   AST 19 01/14/2021   PLT 134 (L) 01/07/2021   TSH 1.880 01/14/2021     He was last seen for this 6 months ago.  Management since that visit includes no medication changes.  He reports good compliance with treatment. He is not having side effects.   Symptoms: No chest pain No chest pressure/discomfort  No dyspnea No lower extremity edema  No numbness or tingling of extremity No orthopnea  No palpitations No paroxysmal nocturnal dyspnea  No speech difficulty No syncope   Current diet: well balanced Current exercise: no regular exercise  The ASCVD Risk score ([place] et al., 2013) failed to calculate for the following reasons:   The valid total cholesterol range is 130 to 320 mg/dL  --------------------------------------------------------------------------------------------------- Prediabetes, Follow-up  Lab Results  Component Value Date   HGBA1C 5.7 (A) 01/14/2021   HGBA1C 5.7 (A) 07/15/2020   HGBA1C 6.1 (H) 01/16/2020   GLUCOSE 100 (H) 01/14/2021   GLUCOSE 102 (H) 01/07/2021   GLUCOSE 80 01/16/2020    Last seen for for  this6 months ago.  Management since that visit includes no medication changes. Current symptoms include none and have been improving.  Prior visit with dietician: no Current diet: well balanced Current exercise: none  Pertinent Labs:    Component Value Date/Time   CHOL 122 01/14/2021 0913   TRIG 73 01/14/2021 0913   CHOLHDL 3.5 01/14/2021 0913   CREATININE 0.83 01/14/2021 0913   CREATININE 0.83 08/18/2012 1343    Wt Readings from Last 3 Encounters:   01/15/21 287 lb (130.2 kg)  01/14/21 291 lb 8 oz (132.2 kg)  01/07/21 296 lb 1.2 oz (134.3 kg)   Patient reports that he thinks he may have had COVID several weeks ago. He did not get tested however he reports an episode of SOB. This has since resolved. He mentioned it to his hematologist on 01/07/2021 and she ordered a CXR, which was normal. He has felt fine since then. He does not some ankle and leg swelling. He reports remotely he was told he had a Baker's cyst. Reports his lower legs are swollen. Denies chest pain. No EKG on file.  -----------------------------------------------------------------------------------------     Medications: Outpatient Medications Prior to Visit  Medication Sig  . Coenzyme Q10 50 MG CAPS CO ENZYME Q-10, 50MG  (Oral Capsule)  1 po qd for 0 days  Quantity: 30.00;  Refills: 0   Ordered :31-Aug-2010  Margarita Rana MD;  Started 31-Mar-2009 Active Comments: DX: 272.0  . meloxicam (MOBIC) 7.5 MG tablet TAKE 1 TABLET DAILY  . naproxen (NAPROSYN) 500 MG tablet Take 500 mg by mouth daily as needed.   . OMEGA-3 FATTY ACIDS PO Take 1 tablet by mouth daily. FISH-EPA, 1000MG  (Oral Capsule)  2 po bid for 0 days  Quantity: 0.00;  Refills: 0   Ordered :31-Aug-2010  Margarita Rana MD;  Started 19-June-2007 Active Comments: DX: 272.0  . sildenafil (VIAGRA) 50 MG tablet TAKE 1 TABLET BY MOUTH AS NEEDED FOR ERECTILE DYSFUNCTION GENERIC EQUIVALENT FOR VIAGRA  . [DISCONTINUED] aspirin 81 MG tablet Take 81 mg by mouth daily.  . [DISCONTINUED] lisinopril-hydrochlorothiazide (ZESTORETIC) 20-12.5 MG tablet TAKE 1 TABLET DAILY  . [DISCONTINUED] metoprolol tartrate (LOPRESSOR) 50 MG tablet TAKE 1 TABLET(50MG ) BY MOUTH 2 TIMES DAILY.  . [DISCONTINUED] montelukast (SINGULAIR) 10 MG tablet TAKE 1 TABLET BY MOUTH DAILY GENERIC EQUIVALENT FOR SINGULAIR  . [DISCONTINUED] simvastatin (ZOCOR) 20 MG tablet Take 1 tablet (20 mg total) by mouth at bedtime.  . [DISCONTINUED] tamsulosin (FLOMAX) 0.4 MG  CAPS capsule TAKE 2 CAPSULES BY MOUTH DAILY GENERIC EQUIVALENT FOR FLOMAX   No facility-administered medications prior to visit.    Review of Systems  Constitutional: Negative.   Respiratory: Negative.   Cardiovascular: Negative.   Hematological: Negative.        Objective    BP (!) 156/102 (BP Location: Left Arm, Patient Position: Sitting, Cuff Size: Normal)   Pulse 88   Temp 98.3 F (36.8 C) (Oral)   Wt 291 lb 8 oz (132.2 kg)   SpO2 94%   BMI 38.46 kg/m     Physical Exam Constitutional:      Appearance: Normal appearance.  Cardiovascular:     Rate and Rhythm: Normal rate and regular rhythm.     Pulses: Normal pulses.     Heart sounds: Normal heart sounds.  Pulmonary:     Effort: Pulmonary effort is normal.     Breath sounds: Normal breath sounds.  Skin:    General: Skin is warm and dry.  Neurological:  General: No focal deficit present.     Mental Status: He is alert and oriented to person, place, and time.  Psychiatric:        Mood and Affect: Mood normal.        Behavior: Behavior normal.       Results for orders placed or performed in visit on 01/14/21  TSH  Result Value Ref Range   TSH 1.880 0.450 - 4.500 uIU/mL  Lipid panel  Result Value Ref Range   Cholesterol, Total 122 100 - 199 mg/dL   Triglycerides 73 0 - 149 mg/dL   HDL 35 (L) >39 mg/dL   VLDL Cholesterol Cal 15 5 - 40 mg/dL   LDL Chol Calc (NIH) 72 0 - 99 mg/dL   Chol/HDL Ratio 3.5 0.0 - 5.0 ratio  Comprehensive metabolic panel  Result Value Ref Range   Glucose 100 (H) 65 - 99 mg/dL   BUN 13 8 - 27 mg/dL   Creatinine, Ser 0.83 0.76 - 1.27 mg/dL   GFR calc non Af Amer 91 >59 mL/min/1.73   GFR calc Af Amer 105 >59 mL/min/1.73   BUN/Creatinine Ratio 16 10 - 24   Sodium 142 134 - 144 mmol/L   Potassium 4.4 3.5 - 5.2 mmol/L   Chloride 95 (L) 96 - 106 mmol/L   CO2 27 20 - 29 mmol/L   Calcium 9.6 8.6 - 10.2 mg/dL   Total Protein 6.6 6.0 - 8.5 g/dL   Albumin 3.7 (L) 3.8 - 4.8 g/dL    Globulin, Total 2.9 1.5 - 4.5 g/dL   Albumin/Globulin Ratio 1.3 1.2 - 2.2   Bilirubin Total 0.7 0.0 - 1.2 mg/dL   Alkaline Phosphatase 85 44 - 121 IU/L   AST 19 0 - 40 IU/L   ALT 16 0 - 44 IU/L  Brain natriuretic peptide  Result Value Ref Range   BNP WILL FOLLOW   POCT glycosylated hemoglobin (Hb A1C)  Result Value Ref Range   Hemoglobin A1C 5.7 (A) 4.0 - 5.6 %   HbA1c POC (<> result, manual entry)     HbA1c, POC (prediabetic range)     HbA1c, POC (controlled diabetic range)     Est. average glucose Bld gHb Est-mCnc 117     Assessment & Plan    1. Annual physical exam  - TSH - Lipid panel - Comprehensive metabolic panel  2. Prediabetes  - POCT glycosylated hemoglobin (Hb A1C)  3. Abdominal aortic aneurysm (AAA) without rupture (HCC)  CT scan UTD within a year, due later this year.   - EKG 12-Lead  4. Essential hypertension  Continue medications.   - TSH - Lipid panel - Comprehensive metabolic panel  5. BMI 36.0-36.9,adult   6. Hypercholesterolemia without hypertriglyceridemia  Discussed importance of healthy weight management Discussed diet and exercise  - TSH - Lipid panel - Comprehensive metabolic panel - simvastatin (ZOCOR) 20 MG tablet; Take 1 tablet (20 mg total) by mouth at bedtime.  Dispense: 90 tablet; Refill: 1  7. Leg swelling  - B Nat Peptide  8. Wheezing  - EKG 12-Lead  9. Atrial fibrillation, unspecified type ([place])  EKG shows atrial fibrillation, do not have prior comparison but patient reports this is a new diagnosis for him. He is well compensated and has a normal HR. He is already on a beta blocker. Will have him see cardiology where he can discuss blood thinners and any further intervention.   - Ambulatory referral to Cardiology  10. Essential (primary) hypertension  -  lisinopril-hydrochlorothiazide (ZESTORETIC) 20-12.5 MG tablet; Take 1 tablet by mouth daily.  Dispense: 90 tablet; Refill: 1 - metoprolol tartrate (LOPRESSOR) 50  MG tablet; TAKE 1 TABLET(50MG ) BY MOUTH 2 TIMES DAILY.  Dispense: 180 tablet; Refill: 1  11. Hay fever  - montelukast (SINGULAIR) 10 MG tablet; Take one daily.  Dispense: 90 tablet; Refill: 1  12. Benign prostatic hyperplasia, unspecified whether lower urinary tract symptoms present  - tamsulosin (FLOMAX) 0.4 MG CAPS capsule; TAKE 2 CAPSULES BY MOUTH DAILY GENERIC EQUIVALENT FOR FLOMAX  Dispense: 180 capsule; Refill: 1   No follow-ups on file.      ITrinna Post, PA-C, have reviewed all documentation for this visit. The documentation on 01/15/21 for the exam, diagnosis, procedures, and orders are all accurate and complete.  The entirety of the information documented in the History of Present Illness, Review of Systems and Physical Exam were personally obtained by me. Portions of this information were initially documented by Wilburt Finlay, CMA and reviewed by me for thoroughness and accuracy.     Paulene Floor  [facility] [phone] (phone) [phone] (fax)  [place]

## 2021-01-14 ENCOUNTER — Telehealth: Payer: Self-pay

## 2021-01-14 ENCOUNTER — Encounter: Payer: Self-pay | Admitting: Physician Assistant

## 2021-01-14 ENCOUNTER — Other Ambulatory Visit: Payer: Self-pay

## 2021-01-14 ENCOUNTER — Ambulatory Visit: Payer: BC Managed Care – PPO | Admitting: Physician Assistant

## 2021-01-14 VITALS — BP 156/102 | HR 88 | Temp 98.3°F | Wt 291.5 lb

## 2021-01-14 DIAGNOSIS — I714 Abdominal aortic aneurysm, without rupture, unspecified: Secondary | ICD-10-CM

## 2021-01-14 DIAGNOSIS — J301 Allergic rhinitis due to pollen: Secondary | ICD-10-CM

## 2021-01-14 DIAGNOSIS — I4891 Unspecified atrial fibrillation: Secondary | ICD-10-CM

## 2021-01-14 DIAGNOSIS — N400 Enlarged prostate without lower urinary tract symptoms: Secondary | ICD-10-CM

## 2021-01-14 DIAGNOSIS — R062 Wheezing: Secondary | ICD-10-CM

## 2021-01-14 DIAGNOSIS — Z0000 Encounter for general adult medical examination without abnormal findings: Secondary | ICD-10-CM

## 2021-01-14 DIAGNOSIS — E7800 Pure hypercholesterolemia, unspecified: Secondary | ICD-10-CM

## 2021-01-14 DIAGNOSIS — Z6836 Body mass index (BMI) 36.0-36.9, adult: Secondary | ICD-10-CM

## 2021-01-14 DIAGNOSIS — M7989 Other specified soft tissue disorders: Secondary | ICD-10-CM

## 2021-01-14 DIAGNOSIS — R7303 Prediabetes: Secondary | ICD-10-CM | POA: Diagnosis not present

## 2021-01-14 DIAGNOSIS — I10 Essential (primary) hypertension: Secondary | ICD-10-CM

## 2021-01-14 LAB — POCT GLYCOSYLATED HEMOGLOBIN (HGB A1C)
Est. average glucose Bld gHb Est-mCnc: 117
Hemoglobin A1C: 5.7 % — AB (ref 4.0–5.6)

## 2021-01-14 MED ORDER — LISINOPRIL-HYDROCHLOROTHIAZIDE 20-12.5 MG PO TABS
1.0000 | ORAL_TABLET | Freq: Every day | ORAL | 1 refills | Status: DC
Start: 2021-01-14 — End: 2021-02-12

## 2021-01-14 MED ORDER — ALBUTEROL SULFATE HFA 108 (90 BASE) MCG/ACT IN AERS: 2.0000 | INHALATION_SPRAY | Freq: Four times a day (QID) | RESPIRATORY_TRACT | 2 refills | Status: DC | PRN

## 2021-01-14 MED ORDER — MONTELUKAST SODIUM 10 MG PO TABS: ORAL_TABLET | ORAL | 1 refills | Status: DC

## 2021-01-14 MED ORDER — METOPROLOL TARTRATE 50 MG PO TABS
ORAL_TABLET | ORAL | 1 refills | Status: DC
Start: 2021-01-14 — End: 2021-02-12

## 2021-01-14 MED ORDER — SIMVASTATIN 20 MG PO TABS: 20.0000 mg | ORAL_TABLET | Freq: Every day | ORAL | 1 refills | Status: DC

## 2021-01-14 MED ORDER — TAMSULOSIN HCL 0.4 MG PO CAPS
ORAL_CAPSULE | ORAL | 1 refills | Status: DC
Start: 2021-01-14 — End: 2021-07-27

## 2021-01-15 ENCOUNTER — Ambulatory Visit: Payer: BC Managed Care – PPO | Admitting: Cardiology

## 2021-01-15 ENCOUNTER — Encounter: Payer: Self-pay | Admitting: Cardiology

## 2021-01-15 VITALS — BP 144/94 | HR 78 | Ht 73.0 in | Wt 287.0 lb

## 2021-01-15 DIAGNOSIS — I10 Essential (primary) hypertension: Secondary | ICD-10-CM | POA: Diagnosis not present

## 2021-01-15 DIAGNOSIS — I4891 Unspecified atrial fibrillation: Secondary | ICD-10-CM | POA: Insufficient documentation

## 2021-01-15 DIAGNOSIS — I4819 Other persistent atrial fibrillation: Secondary | ICD-10-CM

## 2021-01-15 DIAGNOSIS — E7800 Pure hypercholesterolemia, unspecified: Secondary | ICD-10-CM

## 2021-01-15 LAB — COMPREHENSIVE METABOLIC PANEL
ALT: 16 IU/L (ref 0–44)
AST: 19 IU/L (ref 0–40)
Albumin/Globulin Ratio: 1.3 (ref 1.2–2.2)
Albumin: 3.7 g/dL — ABNORMAL LOW (ref 3.8–4.8)
Alkaline Phosphatase: 85 IU/L (ref 44–121)
BUN/Creatinine Ratio: 16 (ref 10–24)
BUN: 13 mg/dL (ref 8–27)
Bilirubin Total: 0.7 mg/dL (ref 0.0–1.2)
CO2: 27 mmol/L (ref 20–29)
Calcium: 9.6 mg/dL (ref 8.6–10.2)
Chloride: 95 mmol/L — ABNORMAL LOW (ref 96–106)
Creatinine, Ser: 0.83 mg/dL (ref 0.76–1.27)
GFR calc Af Amer: 105 mL/min/{1.73_m2} (ref >59)
GFR calc non Af Amer: 91 mL/min/{1.73_m2} (ref >59)
Globulin, Total: 2.9 g/dL (ref 1.5–4.5)
Glucose: 100 mg/dL — ABNORMAL HIGH (ref 65–99)
Potassium: 4.4 mmol/L (ref 3.5–5.2)
Sodium: 142 mmol/L (ref 134–144)
Total Protein: 6.6 g/dL (ref 6.0–8.5)

## 2021-01-15 LAB — LIPID PANEL
Chol/HDL Ratio: 3.5 ratio (ref 0.0–5.0)
Cholesterol, Total: 122 mg/dL (ref 100–199)
HDL: 35 mg/dL — ABNORMAL LOW (ref >39)
LDL Chol Calc (NIH): 72 mg/dL (ref 0–99)
Triglycerides: 73 mg/dL (ref 0–149)
VLDL Cholesterol Cal: 15 mg/dL (ref 5–40)

## 2021-01-15 LAB — TSH: TSH: 1.88 u[IU]/mL (ref 0.450–4.500)

## 2021-01-15 LAB — BRAIN NATRIURETIC PEPTIDE: BNP: 209.9 pg/mL — ABNORMAL HIGH (ref 0.0–100.0)

## 2021-01-15 MED ORDER — APIXABAN 5 MG PO TABS: 5.0000 mg | ORAL_TABLET | Freq: Two times a day (BID) | ORAL | 1 refills | Status: DC

## 2021-01-15 MED ORDER — APIXABAN 5 MG PO TABS: 5.0000 mg | ORAL_TABLET | Freq: Two times a day (BID) | ORAL | 5 refills | Status: DC

## 2021-01-15 NOTE — Progress Notes (Signed)
Cardiology Office Note:    Date:  01/15/2021   ID:  Jason Bolton, DOB 02-27-1956, MRN [id number]  PCP:  Pollak, Adriana M, [place]  Cardiologist:  Kate Sable, MD  Advanced Practice Provider:  No care team member to display Electrophysiologist:  None       Referring MD: Trinna Post, PA-C   Chief Complaint  Patient presents with  . New Patient (Initial Visit)    Referred by Dr. Terrilee Croak for AFIB   Jason Bolton is a 65 y.o. male who is being seen today for the evaluation of atrial fibrillation at the request of Terrilee Croak, Adriana M, [place].   History of Present Illness:    Jason Bolton is a 65 y.o. male with a hx of hypertension, hyperlipidemia, sleep apnea on CPAP, former smoker x40+ years presenting with atrial fibrillation.  Patient saw primary care provider yesterday for a regular visit when EKG obtained showed atrial fibrillation with controlled ventricular rate, heart rate 77.  Patient was started on Lopressor 50 mg twice daily.  He denies any symptoms of palpitations, shortness of breath, chest pain, dizziness.  Past Medical History:  Diagnosis Date  . Allergy   . Hypercholesterolemia   . Hypertension   . 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 Polycythemia   . Sleep apnea     Past Surgical History:  Procedure Laterality Date  . CATARACT EXTRACTION Right 11/2013  . COLONOSCOPY  2006  . COLONOSCOPY WITH PROPOFOL N/A 10/19/2017   Procedure: COLONOSCOPY WITH PROPOFOL;  Surgeon: [name], MD;  Location: [facility] ENDOSCOPY;  Service: Endoscopy;  Laterality: N/A;  . INGUINAL HERNIA REPAIR Right 1972  . MANDIBLE SURGERY  2005   [facility]    Current Medications: Current Meds  Medication Sig  . Coenzyme Q10 50 MG CAPS CO ENZYME Q-10, 50MG  (Oral Capsule)  1 po qd for 0 days  Quantity: 30.00;  Refills: 0   Ordered :31-Aug-2010  Margarita Rana MD;  Started 31-Mar-2009 Active Comments:  DX: 272.0  . lisinopril-hydrochlorothiazide (ZESTORETIC) 20-12.5 MG tablet Take 1 tablet by mouth daily.  . meloxicam (MOBIC) 7.5 MG tablet TAKE 1 TABLET DAILY  . metoprolol tartrate (LOPRESSOR) 50 MG tablet TAKE 1 TABLET(50MG ) BY MOUTH 2 TIMES DAILY.  . montelukast (SINGULAIR) 10 MG tablet Take one daily.  . naproxen (NAPROSYN) 500 MG tablet Take 500 mg by mouth daily as needed.   . OMEGA-3 FATTY ACIDS PO Take 1 tablet by mouth daily. FISH-EPA, 1000MG  (Oral Capsule)  2 po bid for 0 days  Quantity: 0.00;  Refills: 0   Ordered :31-Aug-2010  Margarita Rana MD;  Started 19-June-2007 Active Comments: DX: 272.0  . sildenafil (VIAGRA) 50 MG tablet TAKE 1 TABLET BY MOUTH AS NEEDED FOR ERECTILE DYSFUNCTION GENERIC EQUIVALENT FOR VIAGRA  . simvastatin (ZOCOR) 20 MG tablet Take 1 tablet (20 mg total) by mouth at bedtime.  . tamsulosin (FLOMAX) 0.4 MG CAPS capsule TAKE 2 CAPSULES BY MOUTH DAILY GENERIC EQUIVALENT FOR FLOMAX  . [DISCONTINUED] apixaban (ELIQUIS) 5 MG TABS tablet Take 1 tablet (5 mg total) by mouth 2 (two) times daily.  . [DISCONTINUED] aspirin 81 MG tablet Take 81 mg by mouth daily.     Allergies:   Penicillins   Social History   Socioeconomic History  . Marital status: Married    Spouse name: Not on file  . Number of children: 2  . Years of education:  Not on file  . Highest education level: Not on file  Occupational History  . Occupation: Librarian, academic  Tobacco Use  . Smoking status: Former Smoker    Packs/day: 1.50    Years: 20.00    Pack years: 30.00    Types: Cigarettes    Quit date: 11/29/2015    Years since quitting: 5.1  . Smokeless tobacco: Never Used  . Tobacco comment: quit June of 2008 after [employer]. he was exposed to secondary smoke  Vaping Use  . Vaping Use: Never used  Substance and Sexual Activity  . Alcohol use: Yes    Alcohol/week: 0.0 standard drinks    Comment: occasional  . Drug use: No  . Sexual activity: Not on file  Other Topics Concern  . Not on  file  Social History Narrative  . Not on file   Social Determinants of Health   Financial Resource Strain: Not on file  Food Insecurity: Not on file  Transportation Needs: Not on file  Physical Activity: Not on file  Stress: Not on file  Social Connections: Not on file     Family History: The patient's family history includes Congestive Heart Failure in his mother; Diabetes in his mother; Healthy in his brother, brother, brother, and sister; Pancreatic cancer in his father.  ROS:   Please see the history of present illness.     All other systems reviewed and are negative.  EKGs/Labs/Other Studies Reviewed:    The following studies were reviewed today:   EKG:  EKG is  ordered today.  The ekg ordered today demonstrates atrial fibrillation, heart rate 78  Recent Labs: 01/07/2021: Hemoglobin 15.3; Platelets 134 01/14/2021: ALT 16; BNP WILL FOLLOW; BUN 13; Creatinine, Ser 0.83; Potassium 4.4; Sodium 142; TSH 1.880  Recent Lipid Panel    Component Value Date/Time   CHOL 122 01/14/2021 0913   TRIG 73 01/14/2021 0913   HDL 35 (L) 01/14/2021 0913   CHOLHDL 3.5 01/14/2021 0913   LDLCALC 72 01/14/2021 0913     Risk Assessment/Calculations:      Physical Exam:    VS:  BP (!) 144/94   Pulse 78   Ht 6\' 1"  (1.854 m)   Wt 287 lb (130.2 kg)   BMI 37.87 kg/m     Wt Readings from Last 3 Encounters:  01/15/21 287 lb (130.2 kg)  01/14/21 291 lb 8 oz (132.2 kg)  01/07/21 296 lb 1.2 oz (134.3 kg)     GEN:  Well nourished, well developed in no acute distress HEENT: Normal NECK: No JVD; No carotid bruits LYMPHATICS: No lymphadenopathy CARDIAC: Irregular irregular, no murmurs RESPIRATORY:  Clear to auscultation without rales, wheezing or rhonchi  ABDOMEN: Soft, non-tender, non-distended MUSCULOSKELETAL:  No edema; No deformity  SKIN: Warm and dry NEUROLOGIC:  Alert and oriented x 3 PSYCHIATRIC:  Normal affect   ASSESSMENT:    1. Persistent atrial fibrillation ([place])   2.  Primary hypertension   3. Pure hypercholesterolemia    PLAN:    In order of problems listed above:  1. Persistent atrial fibrillation, CHA2DS2-VASc score 2 (htn, age).  Patient is asymptomatic.  Start Eliquis 5 mg twice daily, Lopressor 50 mg twice daily.  Stop aspirin with starting Eliquis to decrease bleeding risk.  Get echocardiogram.  Plan for DC cardioversion after at least 3 weeks of uninterrupted anticoagulation. 2. Hypertension, continue current BP meds. 3. Hyperlipidemia, continue statin.  Last lipid panel controlled.  Follow-up after echo.  If in A. fib at follow-up visit,  schedule DC cardioversion with myself in the hospital.    Medication Adjustments/Labs and Tests Ordered: Current medicines are reviewed at length with the patient today.  Concerns regarding medicines are outlined above.  Orders Placed This Encounter  Procedures  . EKG 12-Lead  . ECHOCARDIOGRAM COMPLETE   Meds ordered this encounter  Medications  . DISCONTD: apixaban (ELIQUIS) 5 MG TABS tablet    Sig: Take 1 tablet (5 mg total) by mouth 2 (two) times daily.    Dispense:  60 tablet    Refill:  5  . apixaban (ELIQUIS) 5 MG TABS tablet    Sig: Take 1 tablet (5 mg total) by mouth 2 (two) times daily.    Dispense:  60 tablet    Refill:  1    Patient Instructions  Medication Instructions:   Your physician has recommended you make the following change in your medication:   1.  STOP taking your Aspirin. 2.  START taking Eliquis 5 MG: take 1 tablet by mouth twice daily.  *If you need a refill on your cardiac medications before your next appointment, please call your pharmacy*   Lab Work: None ordered If you have labs (blood work) drawn today and your tests are completely normal, you will receive your results only by: Marland Kitchen MyChart Message (if you have MyChart) OR . A paper copy in the mail If you have any lab test that is abnormal or we need to change your treatment, we will call you to review the  results.   Testing/Procedures:  Your physician has requested that you have an echocardiogram. Echocardiography is a painless test that uses sound waves to create images of your heart. It provides your doctor with information about the size and shape of your heart and how well your heart's chambers and valves are working. This procedure takes approximately one hour. There are no restrictions for this procedure.   Follow-Up: At [facility], you and your health needs are our priority.  As part of our continuing mission to provide you with exceptional heart care, we have created designated Provider Care Teams.  These Care Teams include your primary Cardiologist (physician) and Advanced Practice Providers (APPs -  Physician Assistants and Nurse Practitioners) who all work together to provide you with the care you need, when you need it.  We recommend signing up for the patient portal called "MyChart".  Sign up information is provided on this After Visit Summary.  MyChart is used to connect with patients for Virtual Visits (Telemedicine).  Patients are able to view lab/test results, encounter notes, upcoming appointments, etc.  Non-urgent messages can be sent to your provider as well.   To learn more about what you can do with MyChart, go to [email or web address].    Your next appointment:   Follow up after Echo   The format for your next appointment:   In Person  Provider:   Kate Sable, MD   Other Instructions      Signed, Kate Sable, MD  01/15/2021 12:57 PM    [place]

## 2021-01-15 NOTE — Patient Instructions (Signed)
Medication Instructions:   Your physician has recommended you make the following change in your medication:   1.  STOP taking your Aspirin. 2.  START taking Eliquis 5 MG: take 1 tablet by mouth twice daily.  *If you need a refill on your cardiac medications before your next appointment, please call your pharmacy*   Lab Work: None ordered If you have labs (blood work) drawn today and your tests are completely normal, you will receive your results only by: Marland Kitchen MyChart Message (if you have MyChart) OR . A paper copy in the mail If you have any lab test that is abnormal or we need to change your treatment, we will call you to review the results.   Testing/Procedures:  Your physician has requested that you have an echocardiogram. Echocardiography is a painless test that uses sound waves to create images of your heart. It provides your doctor with information about the size and shape of your heart and how well your heart's chambers and valves are working. This procedure takes approximately one hour. There are no restrictions for this procedure.   Follow-Up: At [facility], you and your health needs are our priority.  As part of our continuing mission to provide you with exceptional heart care, we have created designated Provider Care Teams.  These Care Teams include your primary Cardiologist (physician) and Advanced Practice Providers (APPs -  Physician Assistants and Nurse Practitioners) who all work together to provide you with the care you need, when you need it.  We recommend signing up for the patient portal called "MyChart".  Sign up information is provided on this After Visit Summary.  MyChart is used to connect with patients for Virtual Visits (Telemedicine).  Patients are able to view lab/test results, encounter notes, upcoming appointments, etc.  Non-urgent messages can be sent to your provider as well.   To learn more about what you can do with MyChart, go to [email or web address].     Your next appointment:   Follow up after Echo   The format for your next appointment:   In Person  Provider:   Kate Sable, MD   Other Instructions

## 2021-02-03 ENCOUNTER — Other Ambulatory Visit: Payer: Self-pay

## 2021-02-03 ENCOUNTER — Ambulatory Visit (INDEPENDENT_AMBULATORY_CARE_PROVIDER_SITE_OTHER): Payer: BC Managed Care – PPO

## 2021-02-03 DIAGNOSIS — I4819 Other persistent atrial fibrillation: Secondary | ICD-10-CM | POA: Diagnosis not present

## 2021-02-03 LAB — ECHOCARDIOGRAM COMPLETE
Area-P 1/2: 5.2 cm2
S' Lateral: 4.5 cm

## 2021-02-03 MED ORDER — PERFLUTREN LIPID MICROSPHERE
1.0000 mL | INTRAVENOUS | Status: AC | PRN
  Administered 2021-02-03: 3 mL via INTRAVENOUS

## 2021-02-04 ENCOUNTER — Inpatient Hospital Stay: Payer: BC Managed Care – PPO | Attending: Hematology and Oncology

## 2021-02-04 ENCOUNTER — Other Ambulatory Visit: Payer: Self-pay | Admitting: Hematology and Oncology

## 2021-02-04 ENCOUNTER — Inpatient Hospital Stay: Payer: BC Managed Care – PPO

## 2021-02-04 DIAGNOSIS — D751 Secondary polycythemia: Secondary | ICD-10-CM | POA: Diagnosis not present

## 2021-02-04 LAB — HEMOGLOBIN AND HEMATOCRIT, BLOOD
HCT: 57.4 % — ABNORMAL HIGH (ref 39.0–52.0)
Hemoglobin: 16.1 g/dL (ref 13.0–17.0)

## 2021-02-04 NOTE — Progress Notes (Signed)
Per MD order proceed with phlebotomy if HGB greater than 16.5.  Hemoglobin 16.1, HCT 57.4, pt denies any s/s including headaches and states that he does not feel the need for a phlebotomy at this time. Clarified with Dr. Mike Gip.  Phlebotomy held at this time, pt to return to clinic as scheduled. Pt stable at time of discharge.

## 2021-02-06 ENCOUNTER — Ambulatory Visit: Payer: BC Managed Care – PPO | Admitting: Cardiology

## 2021-02-11 NOTE — H&P (View-Only) (Signed)
Cardiology Office Note:    Date:  02/12/2021   ID:  Jason Bolton, DOB Sep 15, 1956, MRN [id number]  PCP:  Trinna Post, PA-C  [facility] Cardiologist:  Kate Sable, MD  [facility] Electrophysiologist:  None   Referring MD: Trinna Post, PA-C   Chief Complaint: Follow-up echo  History of Present Illness:    Jason Bolton is a 65 y.o. male with a hx of HTN, HLD, sleep apea on CPAP, former smoker x 40+ years, polycythemia, recently diagnosed afib who is being seen for echo follow-up.   He recently saw his PCP who found him to be in rate controlled afib already on Lopressor 50mg  BID. He was seen by cardiology 217/22 and was completely asymptomatic, in afib with heart rate of 78bpm. CHADSVASC 2 HTN, age) and she was started on Eliquis 5mg  BID, Plan on DCCV if still in afib at follow-up.   Echo showed mildly reduced LVEF 45-50%, global hypokinesis, mod LVH, RV function is mildly reduced, mildly dilated LA, severely dilated RA, moderately elevated pulmonary artery systolic pressure, trivial MR  Today, the patient says he has been diong well since the last visit. Echo was reviewed in detail. He has been taking Eliquis twice a day, denies bleeding issues. He feels better than he has in months. Denies palpitations, chest pain, shortness of breath. No lower leg edema, orthopnea, pnd. No fever, chills, nasuea, vomiting. EKG shows rate controlled Afib. BP here is very high, 172/112. BP at home he says it's generally 130/84. Spoke with Marrianne Mood, PA-C regarding BP. We gave an extra 50mg  Lopressor while in the office. BP remained high  BP at 3PM - 170/106 HR 80  BP at 3:20PM - 172/110 HR 77  Plan as below.   Past Medical History:  Diagnosis Date  . Allergy   . Hypercholesterolemia   . Hypertension   . 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 Polycythemia   . Sleep apnea     Past Surgical History:  Procedure Laterality Date   . CATARACT EXTRACTION Right 11/2013  . COLONOSCOPY  2006  . COLONOSCOPY WITH PROPOFOL N/A 10/19/2017   Procedure: COLONOSCOPY WITH PROPOFOL;  Surgeon: [name], MD;  Location: [facility] ENDOSCOPY;  Service: Endoscopy;  Laterality: N/A;  . INGUINAL HERNIA REPAIR Right 1972  . MANDIBLE SURGERY  2005   [facility]    Current Medications: Current Meds  Medication Sig  . apixaban (ELIQUIS) 5 MG TABS tablet Take 1 tablet (5 mg total) by mouth 2 (two) times daily.  . chlorthalidone (HYGROTON) 25 MG tablet Take 0.5 tablets (12.5 mg total) by mouth daily.  . Coenzyme Q10 50 MG CAPS CO ENZYME Q-10, 50MG  (Oral Capsule)  1 po qd for 0 days  Quantity: 30.00;  Refills: 0   Ordered :31-Aug-2010  Margarita Rana MD;  Started 31-Mar-2009 Active Comments: DX: 272.0  . lisinopril (ZESTRIL) 20 MG tablet Take 1 tablet (20 mg total) by mouth daily.  . meloxicam (MOBIC) 7.5 MG tablet TAKE 1 TABLET DAILY  . montelukast (SINGULAIR) 10 MG tablet Take one daily.  . naproxen (NAPROSYN) 500 MG tablet Take 500 mg by mouth daily as needed.   . OMEGA-3 FATTY ACIDS PO Take 1 tablet by mouth daily. FISH-EPA, 1000MG  (Oral Capsule)  2 po bid for 0 days  Quantity: 0.00;  Refills: 0   Ordered :31-Aug-2010  Margarita Rana MD;  Started 19-June-2007 Active Comments: DX: 272.0  . sildenafil (VIAGRA)  50 MG tablet TAKE 1 TABLET BY MOUTH AS NEEDED FOR ERECTILE DYSFUNCTION GENERIC EQUIVALENT FOR VIAGRA  . simvastatin (ZOCOR) 20 MG tablet Take 1 tablet (20 mg total) by mouth at bedtime.  . tamsulosin (FLOMAX) 0.4 MG CAPS capsule TAKE 2 CAPSULES BY MOUTH DAILY GENERIC EQUIVALENT FOR FLOMAX  . [DISCONTINUED] lisinopril-hydrochlorothiazide (ZESTORETIC) 20-12.5 MG tablet Take 1 tablet by mouth daily.  . [DISCONTINUED] metoprolol tartrate (LOPRESSOR) 50 MG tablet TAKE 1 TABLET(50MG ) BY MOUTH 2 TIMES DAILY.     Allergies:   Penicillins   Social History   Socioeconomic History  . Marital status: Married    Spouse name: Not on file  . Number  of children: 2  . Years of education: Not on file  . Highest education level: Not on file  Occupational History  . Occupation: Librarian, academic  Tobacco Use  . Smoking status: Former Smoker    Packs/day: 1.50    Years: 20.00    Pack years: 30.00    Types: Cigarettes    Quit date: 11/29/2015    Years since quitting: 5.2  . Smokeless tobacco: Never Used  . Tobacco comment: quit June of 2008 after Chantix. he was exposed to secondary smoke  Vaping Use  . Vaping Use: Never used  Substance and Sexual Activity  . Alcohol use: Yes    Alcohol/week: 0.0 standard drinks    Comment: occasional  . Drug use: No  . Sexual activity: Not on file  Other Topics Concern  . Not on file  Social History Narrative  . Not on file   Social Determinants of Health   Financial Resource Strain: Not on file  Food Insecurity: Not on file  Transportation Needs: Not on file  Physical Activity: Not on file  Stress: Not on file  Social Connections: Not on file     Family History: The patient's family history includes Congestive Heart Failure in his mother; Diabetes in his mother; Healthy in his brother, brother, brother, and sister; Pancreatic cancer in his father.  ROS:   Please see the history of present illness.     All other systems reviewed and are negative.  EKGs/Labs/Other Studies Reviewed:    The following studies were reviewed today:  Echo 02/03/21 1. Left ventricular ejection fraction, by estimation, is 45 to 50%. The  left ventricle has mildly decreased function. The left ventricle  demonstrates global hypokinesis. The left ventricular internal cavity size  was borderline dilated. There is moderate  left ventricular hypertrophy. Left ventricular diastolic parameters are  indeterminate.  2. Right ventricular systolic function is mildly reduced. The right  ventricular size is moderately enlarged. There is moderately elevated  pulmonary artery systolic pressure.  3. Left atrial size was  mildly dilated.  4. Right atrial size was severely dilated.  5. The mitral valve is normal in structure. Trivial mitral valve  regurgitation.  6. The aortic valve has an indeterminant number of cusps. There is mild  calcification of the aortic valve. There is mild thickening of the aortic  valve. Aortic valve regurgitation is not visualized. No aortic stenosis is  present.  7. The inferior vena cava is dilated in size with >50% respiratory  variability, suggesting right atrial pressure of 8 mmHg.   EKG:  EKG is  ordered today.  The ekg ordered today demonstrates Afib, 81bpm, RAD, nonspecific T wave chagnes  Recent Labs: 01/07/2021: Platelets 134 01/14/2021: ALT 16; BNP 209.9; BUN 13; Creatinine, Ser 0.83; Potassium 4.4; Sodium 142; TSH 1.880 02/04/2021: Hemoglobin 16.1  Recent Lipid Panel    Component Value Date/Time   CHOL 122 01/14/2021 0913   TRIG 73 01/14/2021 0913   HDL 35 (L) 01/14/2021 0913   CHOLHDL 3.5 01/14/2021 0913   LDLCALC 72 01/14/2021 0913    Physical Exam:    VS:  BP (!) 170/106   Pulse 80   Ht 6\' 1"  (1.854 m)   Wt 281 lb (127.5 kg)   BMI 37.07 kg/m     Wt Readings from Last 3 Encounters:  02/12/21 281 lb (127.5 kg)  01/15/21 287 lb (130.2 kg)  01/14/21 291 lb 8 oz (132.2 kg)     GEN:  Well nourished, well developed in no acute distress HEENT: Normal NECK: No JVD; No carotid bruits LYMPHATICS: No lymphadenopathy CARDIAC: Irreg Irreg, no murmurs, rubs, gallops RESPIRATORY:  Clear to auscultation without rales, wheezing or rhonchi  ABDOMEN: Soft, non-tender, non-distended MUSCULOSKELETAL:  No edema; No deformity  SKIN: Warm and dry NEUROLOGIC:  Alert and oriented x 3 PSYCHIATRIC:  Normal affect   ASSESSMENT:    1. Persistent atrial fibrillation ([place])   2. Primary hypertension   3. Pure hypercholesterolemia   4. Cardiomyopathy, unspecified type ([place])   5. Essential (primary) hypertension   6. Hypertensive urgency   7. HFrEF (heart failure  with reduced ejection fraction) (HCC)    PLAN:    In order of problems listed above:  Persistent Afib Patient remains in rate controlled Afib. Overall he is feeling much better. He has been taking Eliquis 5mg  BID. He denies missing doses. Since he remains in afib plan for cardioversion. Patient is agreeable to this. Continue Lopressor for rate control. We will see him back after the procedure.  Cardiomyopathy Mildly reduced EF by recent echo. This is in the setting of new afib. No known history of CAD. He has trace edema on the right leg, however this is unchanged and weights are stable. Re-check echo post-cardioversion to reassess EF.   Hypertensive Urgency HTN BP significantly elevated today, 172/112. It has been elevated at this last few visits as well. He says at home is130-140s/70-80s. He was given 50mg  lopressor in the office. Repeat BP still significantly elevatd. I will increase Lopressor to 100mg  BID. Spoke with Marrianne Mood, PA-C regarding patient. We will also stop HCTZ and start chlorthalidone. BMET in 1 week. Continue lisinopril 20mg  daily. We will see patient back after procedure.   HLD Continue simvastatin. LDL 84 in 2020  OSA Reports compliance with CPAP  Disposition: Follow up post-cardioversion  Shared Decision Making/Informed Consent   Shared Decision Making/Informed Consent The risks (stroke, cardiac arrhythmias rarely resulting in the need for a temporary or permanent pacemaker, skin irritation or burns and complications associated with conscious sedation including aspiration, arrhythmia, respiratory failure and death), benefits (restoration of normal sinus rhythm) and alternatives of a direct current cardioversion were explained in detail to Mr. Mcmanamon and he agrees to proceed.          Signed, Kylar Leonhardt Ninfa Meeker, PA-C  02/12/2021 4:03 PM    [facility]

## 2021-02-11 NOTE — Progress Notes (Signed)
Cardiology Office Note:    Date:  02/12/2021   ID:  RIHAN SCHUELER, DOB 07-Jan-1956, MRN [id number]  PCP:  Trinna Post, PA-C  [facility] Cardiologist:  Kate Sable, MD  [facility] Electrophysiologist:  None   Referring MD: Trinna Post, PA-C   Chief Complaint: Follow-up echo  History of Present Illness:    Jason Bolton is a 65 y.o. male with a hx of HTN, HLD, sleep apea on CPAP, former smoker x 40+ years, polycythemia, recently diagnosed afib who is being seen for echo follow-up.   He recently saw his PCP who found him to be in rate controlled afib already on Lopressor 50mg  BID. He was seen by cardiology 217/22 and was completely asymptomatic, in afib with heart rate of 78bpm. CHADSVASC 2 HTN, age) and she was started on Eliquis 5mg  BID, Plan on DCCV if still in afib at follow-up.   Echo showed mildly reduced LVEF 45-50%, global hypokinesis, mod LVH, RV function is mildly reduced, mildly dilated LA, severely dilated RA, moderately elevated pulmonary artery systolic pressure, trivial MR  Today, the patient says he has been diong well since the last visit. Echo was reviewed in detail. He has been taking Eliquis twice a day, denies bleeding issues. He feels better than he has in months. Denies palpitations, chest pain, shortness of breath. No lower leg edema, orthopnea, pnd. No fever, chills, nasuea, vomiting. EKG shows rate controlled Afib. BP here is very high, 172/112. BP at home he says it's generally 130/84. Spoke with Marrianne Mood, PA-C regarding BP. We gave an extra 50mg  Lopressor while in the office. BP remained high  BP at 3PM - 170/106 HR 80  BP at 3:20PM - 172/110 HR 77  Plan as below.   Past Medical History:  Diagnosis Date  . Allergy   . Hypercholesterolemia   . Hypertension   . 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 Polycythemia   . Sleep apnea     Past Surgical History:  Procedure Laterality Date   . CATARACT EXTRACTION Right 11/2013  . COLONOSCOPY  2006  . COLONOSCOPY WITH PROPOFOL N/A 10/19/2017   Procedure: COLONOSCOPY WITH PROPOFOL;  Surgeon: [name], MD;  Location: [facility] ENDOSCOPY;  Service: Endoscopy;  Laterality: N/A;  . INGUINAL HERNIA REPAIR Right 1972  . MANDIBLE SURGERY  2005   [facility]    Current Medications: Current Meds  Medication Sig  . apixaban (ELIQUIS) 5 MG TABS tablet Take 1 tablet (5 mg total) by mouth 2 (two) times daily.  . chlorthalidone (HYGROTON) 25 MG tablet Take 0.5 tablets (12.5 mg total) by mouth daily.  . Coenzyme Q10 50 MG CAPS CO ENZYME Q-10, 50MG  (Oral Capsule)  1 po qd for 0 days  Quantity: 30.00;  Refills: 0   Ordered :31-Aug-2010  Margarita Rana MD;  Started 31-Mar-2009 Active Comments: DX: 272.0  . lisinopril (ZESTRIL) 20 MG tablet Take 1 tablet (20 mg total) by mouth daily.  . meloxicam (MOBIC) 7.5 MG tablet TAKE 1 TABLET DAILY  . montelukast (SINGULAIR) 10 MG tablet Take one daily.  . naproxen (NAPROSYN) 500 MG tablet Take 500 mg by mouth daily as needed.   . OMEGA-3 FATTY ACIDS PO Take 1 tablet by mouth daily. FISH-EPA, 1000MG  (Oral Capsule)  2 po bid for 0 days  Quantity: 0.00;  Refills: 0   Ordered :31-Aug-2010  Margarita Rana MD;  Started 19-June-2007 Active Comments: DX: 272.0  . sildenafil (VIAGRA)  50 MG tablet TAKE 1 TABLET BY MOUTH AS NEEDED FOR ERECTILE DYSFUNCTION GENERIC EQUIVALENT FOR VIAGRA  . simvastatin (ZOCOR) 20 MG tablet Take 1 tablet (20 mg total) by mouth at bedtime.  . tamsulosin (FLOMAX) 0.4 MG CAPS capsule TAKE 2 CAPSULES BY MOUTH DAILY GENERIC EQUIVALENT FOR FLOMAX  . [DISCONTINUED] lisinopril-hydrochlorothiazide (ZESTORETIC) 20-12.5 MG tablet Take 1 tablet by mouth daily.  . [DISCONTINUED] metoprolol tartrate (LOPRESSOR) 50 MG tablet TAKE 1 TABLET(50MG ) BY MOUTH 2 TIMES DAILY.     Allergies:   Penicillins   Social History   Socioeconomic History  . Marital status: Married    Spouse name: Not on file  . Number  of children: 2  . Years of education: Not on file  . Highest education level: Not on file  Occupational History  . Occupation: Librarian, academic  Tobacco Use  . Smoking status: Former Smoker    Packs/day: 1.50    Years: 20.00    Pack years: 30.00    Types: Cigarettes    Quit date: 11/29/2015    Years since quitting: 5.2  . Smokeless tobacco: Never Used  . Tobacco comment: quit June of 2008 after Chantix. he was exposed to secondary smoke  Vaping Use  . Vaping Use: Never used  Substance and Sexual Activity  . Alcohol use: Yes    Alcohol/week: 0.0 standard drinks    Comment: occasional  . Drug use: No  . Sexual activity: Not on file  Other Topics Concern  . Not on file  Social History Narrative  . Not on file   Social Determinants of Health   Financial Resource Strain: Not on file  Food Insecurity: Not on file  Transportation Needs: Not on file  Physical Activity: Not on file  Stress: Not on file  Social Connections: Not on file     Family History: The patient's family history includes Congestive Heart Failure in his mother; Diabetes in his mother; Healthy in his brother, brother, brother, and sister; Pancreatic cancer in his father.  ROS:   Please see the history of present illness.     All other systems reviewed and are negative.  EKGs/Labs/Other Studies Reviewed:    The following studies were reviewed today:  Echo 02/03/21 1. Left ventricular ejection fraction, by estimation, is 45 to 50%. The  left ventricle has mildly decreased function. The left ventricle  demonstrates global hypokinesis. The left ventricular internal cavity size  was borderline dilated. There is moderate  left ventricular hypertrophy. Left ventricular diastolic parameters are  indeterminate.  2. Right ventricular systolic function is mildly reduced. The right  ventricular size is moderately enlarged. There is moderately elevated  pulmonary artery systolic pressure.  3. Left atrial size was  mildly dilated.  4. Right atrial size was severely dilated.  5. The mitral valve is normal in structure. Trivial mitral valve  regurgitation.  6. The aortic valve has an indeterminant number of cusps. There is mild  calcification of the aortic valve. There is mild thickening of the aortic  valve. Aortic valve regurgitation is not visualized. No aortic stenosis is  present.  7. The inferior vena cava is dilated in size with >50% respiratory  variability, suggesting right atrial pressure of 8 mmHg.   EKG:  EKG is  ordered today.  The ekg ordered today demonstrates Afib, 81bpm, RAD, nonspecific T wave chagnes  Recent Labs: 01/07/2021: Platelets 134 01/14/2021: ALT 16; BNP 209.9; BUN 13; Creatinine, Ser 0.83; Potassium 4.4; Sodium 142; TSH 1.880 02/04/2021: Hemoglobin 16.1  Recent Lipid Panel    Component Value Date/Time   CHOL 122 01/14/2021 0913   TRIG 73 01/14/2021 0913   HDL 35 (L) 01/14/2021 0913   CHOLHDL 3.5 01/14/2021 0913   LDLCALC 72 01/14/2021 0913    Physical Exam:    VS:  BP (!) 170/106   Pulse 80   Ht 6\' 1"  (1.854 m)   Wt 281 lb (127.5 kg)   BMI 37.07 kg/m     Wt Readings from Last 3 Encounters:  02/12/21 281 lb (127.5 kg)  01/15/21 287 lb (130.2 kg)  01/14/21 291 lb 8 oz (132.2 kg)     GEN:  Well nourished, well developed in no acute distress HEENT: Normal NECK: No JVD; No carotid bruits LYMPHATICS: No lymphadenopathy CARDIAC: Irreg Irreg, no murmurs, rubs, gallops RESPIRATORY:  Clear to auscultation without rales, wheezing or rhonchi  ABDOMEN: Soft, non-tender, non-distended MUSCULOSKELETAL:  No edema; No deformity  SKIN: Warm and dry NEUROLOGIC:  Alert and oriented x 3 PSYCHIATRIC:  Normal affect   ASSESSMENT:    1. Persistent atrial fibrillation ([place])   2. Primary hypertension   3. Pure hypercholesterolemia   4. Cardiomyopathy, unspecified type ([place])   5. Essential (primary) hypertension   6. Hypertensive urgency   7. HFrEF (heart failure  with reduced ejection fraction) (HCC)    PLAN:    In order of problems listed above:  Persistent Afib Patient remains in rate controlled Afib. Overall he is feeling much better. He has been taking Eliquis 5mg  BID. He denies missing doses. Since he remains in afib plan for cardioversion. Patient is agreeable to this. Continue Lopressor for rate control. We will see him back after the procedure.  Cardiomyopathy Mildly reduced EF by recent echo. This is in the setting of new afib. No known history of CAD. He has trace edema on the right leg, however this is unchanged and weights are stable. Re-check echo post-cardioversion to reassess EF.   Hypertensive Urgency HTN BP significantly elevated today, 172/112. It has been elevated at this last few visits as well. He says at home is130-140s/70-80s. He was given 50mg  lopressor in the office. Repeat BP still significantly elevatd. I will increase Lopressor to 100mg  BID. Spoke with Marrianne Mood, PA-C regarding patient. We will also stop HCTZ and start chlorthalidone. BMET in 1 week. Continue lisinopril 20mg  daily. We will see patient back after procedure.   HLD Continue simvastatin. LDL 84 in 2020  OSA Reports compliance with CPAP  Disposition: Follow up post-cardioversion  Shared Decision Making/Informed Consent   Shared Decision Making/Informed Consent The risks (stroke, cardiac arrhythmias rarely resulting in the need for a temporary or permanent pacemaker, skin irritation or burns and complications associated with conscious sedation including aspiration, arrhythmia, respiratory failure and death), benefits (restoration of normal sinus rhythm) and alternatives of a direct current cardioversion were explained in detail to Mr. Merlo and he agrees to proceed.          Signed, Govanni Plemons Ninfa Meeker, PA-C  02/12/2021 4:03 PM    [facility]

## 2021-02-12 ENCOUNTER — Encounter: Payer: Self-pay | Admitting: Physician Assistant

## 2021-02-12 ENCOUNTER — Ambulatory Visit (INDEPENDENT_AMBULATORY_CARE_PROVIDER_SITE_OTHER): Payer: BC Managed Care – PPO | Admitting: Medical

## 2021-02-12 ENCOUNTER — Other Ambulatory Visit: Payer: Self-pay

## 2021-02-12 VITALS — BP 170/106 | HR 80 | Ht 73.0 in | Wt 281.0 lb

## 2021-02-12 DIAGNOSIS — I429 Cardiomyopathy, unspecified: Secondary | ICD-10-CM | POA: Diagnosis not present

## 2021-02-12 DIAGNOSIS — I10 Essential (primary) hypertension: Secondary | ICD-10-CM | POA: Diagnosis not present

## 2021-02-12 DIAGNOSIS — I4819 Other persistent atrial fibrillation: Secondary | ICD-10-CM | POA: Diagnosis not present

## 2021-02-12 DIAGNOSIS — I5020 Unspecified systolic (congestive) heart failure: Secondary | ICD-10-CM

## 2021-02-12 DIAGNOSIS — E7800 Pure hypercholesterolemia, unspecified: Secondary | ICD-10-CM

## 2021-02-12 DIAGNOSIS — I160 Hypertensive urgency: Secondary | ICD-10-CM

## 2021-02-12 MED ORDER — LISINOPRIL 20 MG PO TABS: 20.0000 mg | ORAL_TABLET | Freq: Every day | ORAL | 3 refills | Status: DC

## 2021-02-12 MED ORDER — CHLORTHALIDONE 25 MG PO TABS: 12.5000 mg | ORAL_TABLET | Freq: Every day | ORAL | 3 refills | Status: DC

## 2021-02-12 MED ORDER — METOPROLOL TARTRATE 12.5 MG HALF TABLET
50.0000 mg | ORAL_TABLET | Freq: Once | ORAL | Status: AC
  Administered 2021-02-12: 50 mg via ORAL

## 2021-02-12 MED ORDER — METOPROLOL TARTRATE 100 MG PO TABS: 100.0000 mg | ORAL_TABLET | Freq: Two times a day (BID) | ORAL | 3 refills | Status: DC

## 2021-02-12 NOTE — Patient Instructions (Addendum)
Medication Instructions:  Your physician has recommended you make the following change in your medication:   1) STOP Lisinopril-Hydrochlorothiazide (combination)  2) START Lisinopril 20mg  DAILY  3) START Chlorthalidone 12.5mg  DAILY   4) INCREASE Metoprolol Tartrate to 100mg  TWICE daily   *If you need a refill on your cardiac medications before your next appointment, please call your pharmacy*   Lab Work:  Your physician recommends that you have lab work at [facility] at [facility] (you may have this done tomorrow): Bmet, CBC, BNP, Magnesium -  Please go to the [facility]. You will check in at the front desk to the right as you walk into the atrium. Valet Parking is offered if needed. - No appointment needed. You may go any day between 7 am and 6 pm.   Testing/Procedures:  You are scheduled for a Cardioversion on Wednesday 03/04/21 with Dr.Agbor-Etang. Please arrive at the [facility] at 6:30 a.m. on the day of your procedure.  DIET INSTRUCTIONS:  Nothing to eat or drink after midnight except your medications with a sip of water.         1) Labs: To be completed 02/13/21 at [facility]  2) Medications:  YOU MAY TAKE ALL of your remaining medications with a small amount of water.  3) Must have a responsible person to drive you home.  4) Bring a current list of your medications and current insurance cards.    If you have any questions after you get home, please call the office at [phone]  COVID PRE- TEST: You will need a COVID TEST prior to the procedure:   LOCATION: Pre-Admit testing office, [address] in the [place]                                   located on the [facility] campus                                   at [address], [place], [place] [address]   DATE/TIME:  Monday 03/02/21 (anytime between 8 am and 2 pm)     Follow-Up: At [facility], you and your health needs are our priority.  As part of our  continuing mission to provide you with exceptional heart care, we have created designated Provider Care Teams.  These Care Teams include your primary Cardiologist (physician) and Advanced Practice Providers (APPs -  Physician Assistants and Nurse Practitioners) who all work together to provide you with the care you need, when you need it.  We recommend signing up for the patient portal called "MyChart".  Sign up information is provided on this After Visit Summary.  MyChart is used to connect with patients for Virtual Visits (Telemedicine).  Patients are able to view lab/test results, encounter notes, upcoming appointments, etc.  Non-urgent messages can be sent to your provider as well.   To learn more about what you can do with MyChart, go to [email or web address].    Your next appointment:    Follow up after procedure  The format for your next appointment:   In Person  Provider:   You may see Kate Sable, MD or one of the following Advanced Practice Providers on your designated Care Team:    Murray Hodgkins, NP  Christell Faith, PA-C  Marrianne Mood, PA-C  Cadence Kathlen Mody, PA-C  Laurann Montana, NP

## 2021-02-13 ENCOUNTER — Telehealth: Payer: Self-pay | Admitting: *Deleted

## 2021-02-13 DIAGNOSIS — Z79899 Other long term (current) drug therapy: Secondary | ICD-10-CM

## 2021-02-13 DIAGNOSIS — I5020 Unspecified systolic (congestive) heart failure: Secondary | ICD-10-CM

## 2021-02-13 NOTE — Telephone Encounter (Signed)
New orders received from [place], [place] for pt to have repeat Bmet 1 week after starting Chlorthalidone.  Notified pt and he verbalized understanding. He will have repeat Bmet at the medical mall at [facility] on 3/28.  He is starting Chlorthalidone today and states he was going to have baseline lab work done either today or Monday 3/21.  Orders placed. Pt has no further questions at this time.

## 2021-02-16 ENCOUNTER — Other Ambulatory Visit
Admission: RE | Admit: 2021-02-16 | Discharge: 2021-02-16 | Disposition: A | Discharge status: Home / Self Care | Payer: BC Managed Care – PPO | Source: Ambulatory Visit | Attending: Medical | Admitting: Medical

## 2021-02-16 DIAGNOSIS — Z79899 Other long term (current) drug therapy: Secondary | ICD-10-CM | POA: Insufficient documentation

## 2021-02-16 DIAGNOSIS — I4819 Other persistent atrial fibrillation: Secondary | ICD-10-CM

## 2021-02-16 DIAGNOSIS — I5020 Unspecified systolic (congestive) heart failure: Secondary | ICD-10-CM

## 2021-02-16 LAB — BASIC METABOLIC PANEL
Anion gap: 6 (ref 5–15)
BUN: 18 mg/dL (ref 8–23)
CO2: 30 mmol/L (ref 22–32)
Calcium: 9.2 mg/dL (ref 8.9–10.3)
Chloride: 101 mmol/L (ref 98–111)
Creatinine, Ser: 0.81 mg/dL (ref 0.61–1.24)
GFR, Estimated: >60 mL/min (ref >60)
Glucose, Bld: 121 mg/dL — ABNORMAL HIGH (ref 70–99)
Potassium: 4.1 mmol/L (ref 3.5–5.1)
Sodium: 137 mmol/L (ref 135–145)

## 2021-02-16 LAB — CBC
HCT: 59.7 % — ABNORMAL HIGH (ref 39.0–52.0)
Hemoglobin: 16.9 g/dL (ref 13.0–17.0)
MCH: 22.1 pg — ABNORMAL LOW (ref 26.0–34.0)
MCHC: 28.3 g/dL — ABNORMAL LOW (ref 30.0–36.0)
MCV: 78.1 fL — ABNORMAL LOW (ref 80.0–100.0)
Platelets: 138 10*3/uL — ABNORMAL LOW (ref 150–400)
RBC: 7.64 MIL/uL — ABNORMAL HIGH (ref 4.22–5.81)
RDW: 23.1 % — ABNORMAL HIGH (ref 11.5–15.5)
WBC: 8.7 10*3/uL (ref 4.0–10.5)
nRBC: 0 % (ref 0.0–0.2)

## 2021-02-16 LAB — BRAIN NATRIURETIC PEPTIDE: B Natriuretic Peptide: 256.3 pg/mL — ABNORMAL HIGH (ref 0.0–100.0)

## 2021-02-16 LAB — MAGNESIUM: Magnesium: 1.8 mg/dL (ref 1.7–2.4)

## 2021-02-23 ENCOUNTER — Other Ambulatory Visit
Admission: RE | Admit: 2021-02-23 | Discharge: 2021-02-23 | Disposition: A | Discharge status: Home / Self Care | Payer: BC Managed Care – PPO | Attending: Medical | Admitting: Medical

## 2021-02-23 DIAGNOSIS — I4819 Other persistent atrial fibrillation: Secondary | ICD-10-CM | POA: Diagnosis not present

## 2021-02-23 LAB — BASIC METABOLIC PANEL
Anion gap: 9 (ref 5–15)
BUN: 17 mg/dL (ref 8–23)
CO2: 31 mmol/L (ref 22–32)
Calcium: 9.5 mg/dL (ref 8.9–10.3)
Chloride: 98 mmol/L (ref 98–111)
Creatinine, Ser: 0.9 mg/dL (ref 0.61–1.24)
GFR, Estimated: >60 mL/min (ref >60)
Glucose, Bld: 132 mg/dL — ABNORMAL HIGH (ref 70–99)
Potassium: 4.2 mmol/L (ref 3.5–5.1)
Sodium: 138 mmol/L (ref 135–145)

## 2021-03-01 IMAGING — CT CT ABD-PELV W/ CM
2 of 5 series · 15 of 46 positions shown, 17 images · IV contrast (omnipaque)
Comparison: 10/27/2017 CT abdomen/pelvis.

CLINICAL DATA: Erythrocytosis. Elevated EPO level. Back pain.
Family history of adrenal tumor.

EXAM:
CT ABDOMEN AND PELVIS WITH CONTRAST
TECHNIQUE: Multidetector CT imaging of the abdomen and pelvis was performed
using the standard protocol following bolus administration of
intravenous contrast.
CONTRAST:  100mL OMNIPAQUE IOHEXOL 300 MG/ML  SOLN

[Series 2: abd pelvis 5.00 · axial · 0.79mm/px · z∈[-1594,-1124]mm · 12 of 106 slices shown, 14 images]
[im 6/106  soft-tissue]
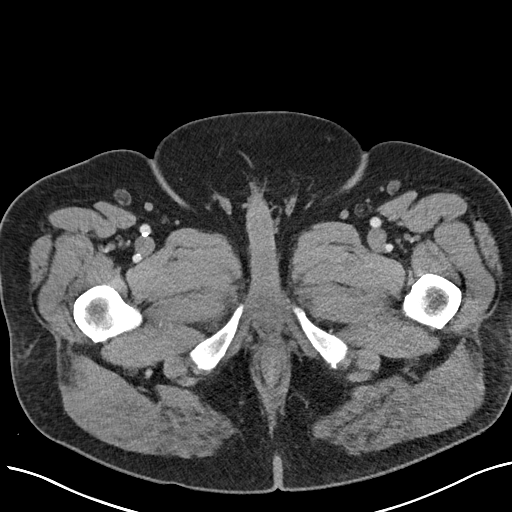
[im 6/106  bone]
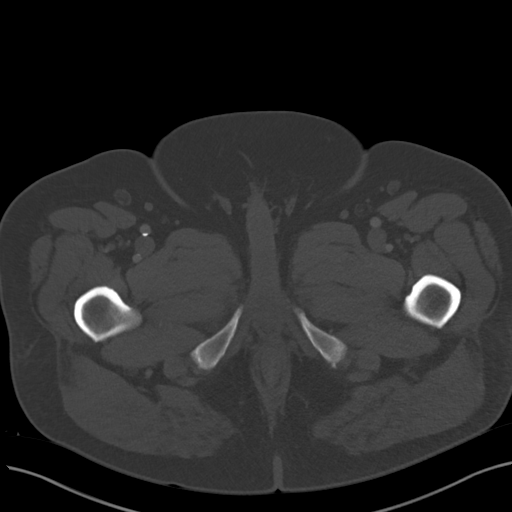
[im 18/106  soft-tissue]
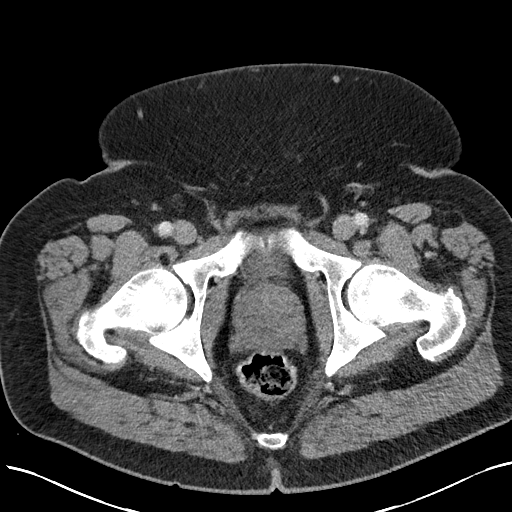
[im 24/106  soft-tissue]
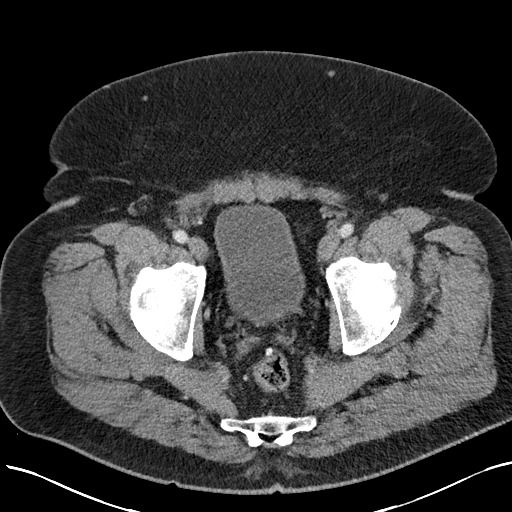
[im 30/106  soft-tissue]
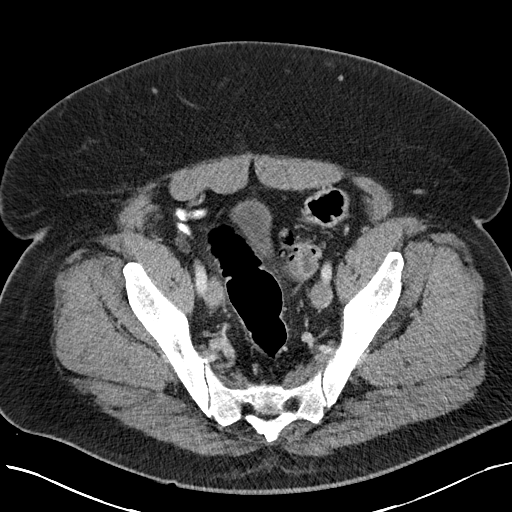
[im 41/106  soft-tissue]
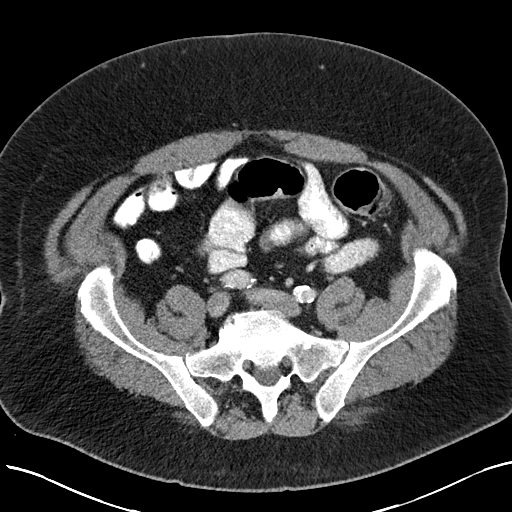
[im 47/106  soft-tissue]
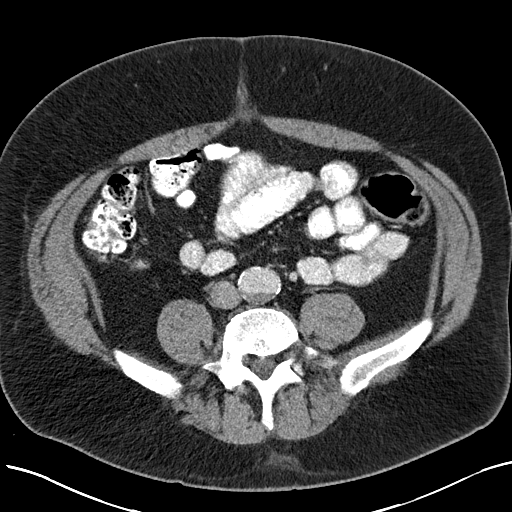
[im 59/106  soft-tissue]
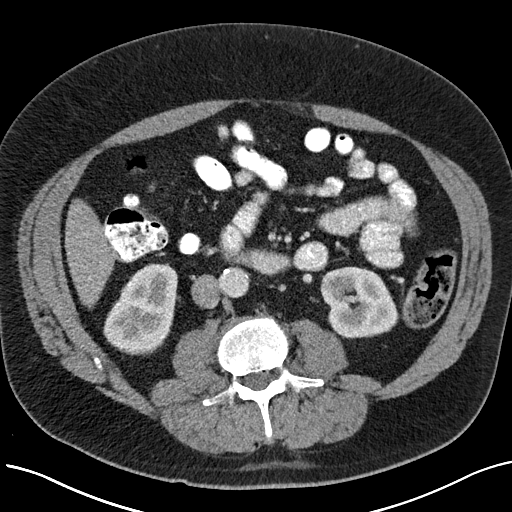
[im 65/106  soft-tissue]
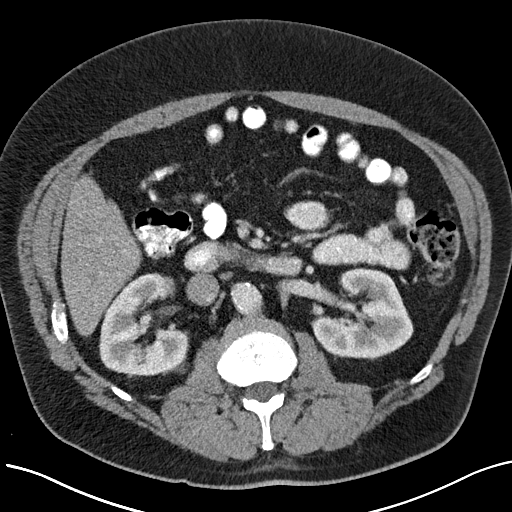
[im 76/106  soft-tissue]
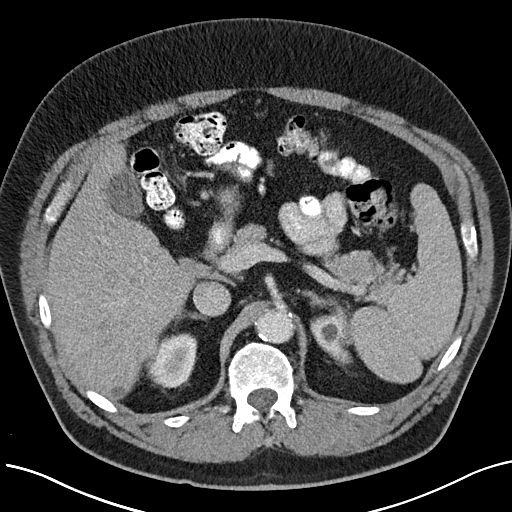
[im 76/106  bone]
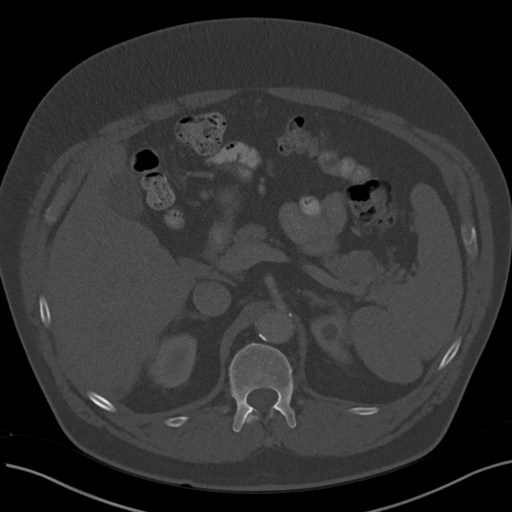
[im 82/106  soft-tissue]
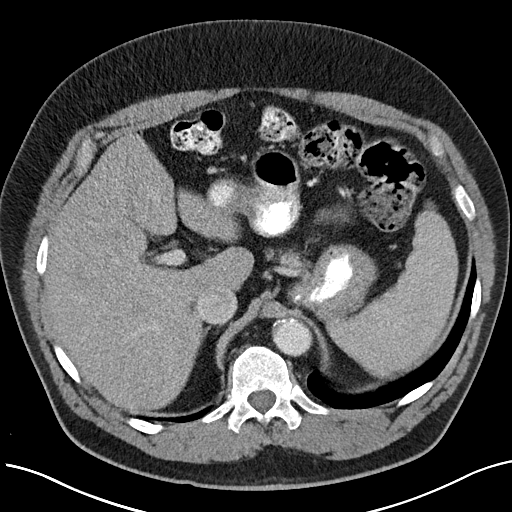
[im 88/106  soft-tissue]
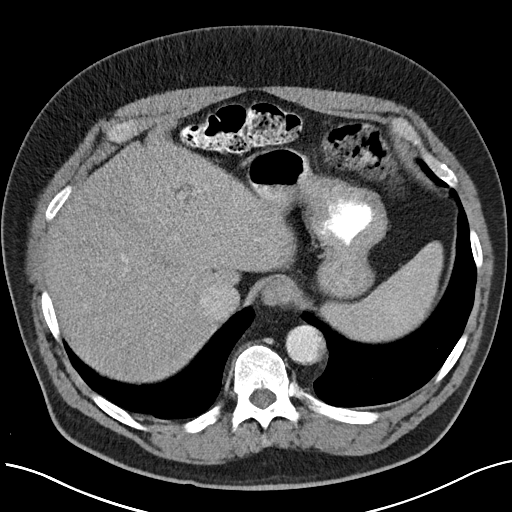
[im 100/106  soft-tissue]
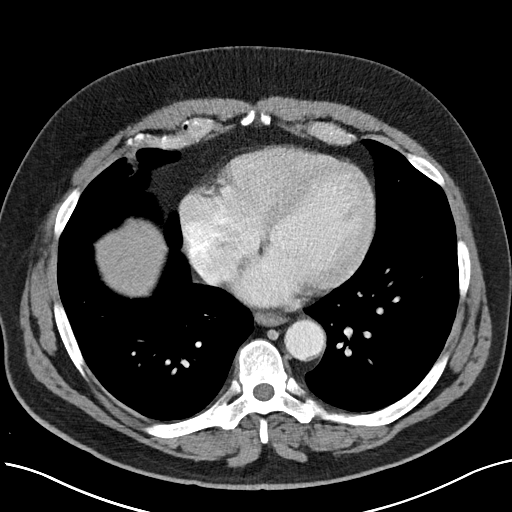

[Series 4: coronals abd pelvis 2.00 cor · coronal · 0.79mm/px · 3 of 191 slices shown]
[im 64/191  soft-tissue]
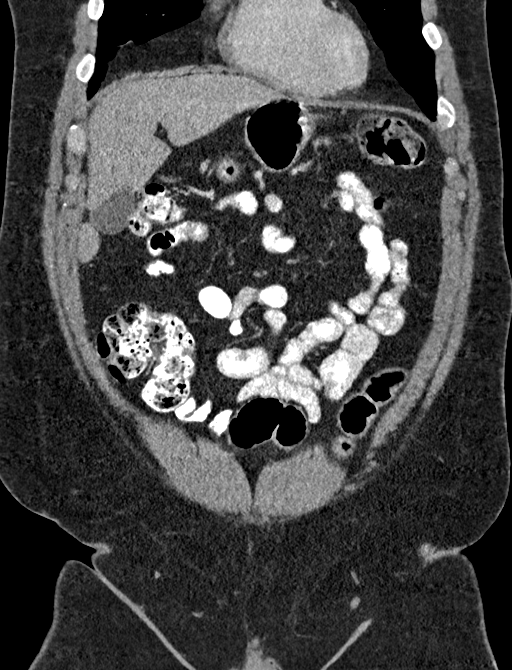
[im 85/191  soft-tissue]
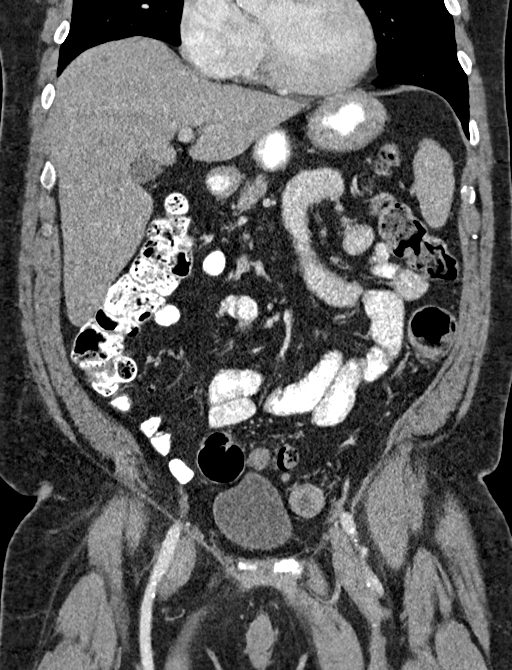
[im 106/191  soft-tissue]
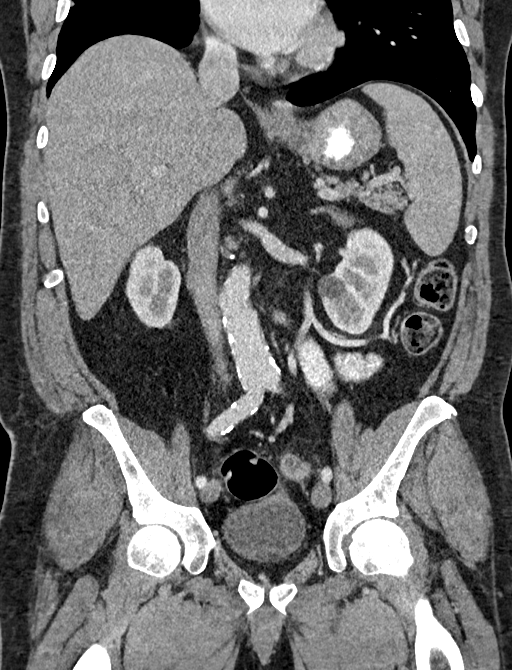

[15 of 46 positions shown; findings below may reference images not displayed]

FINDINGS: Lower chest: No significant pulmonary nodules or acute consolidative
airspace disease.

Hepatobiliary: Normal liver size. Simple 0.8 cm posterior right
liver cyst. A few scattered subcentimeter hypodense liver lesions
are too small to characterize and are unchanged, considered benign.
No new liver lesions. Normal gallbladder with no radiopaque
cholelithiasis. No biliary ductal dilatation.

Pancreas: Normal, with no mass or duct dilation.

Spleen: Borderline mild splenomegaly. Craniocaudal splenic length
13.0 cm, previously 11.9 cm, increased. No splenic masses.

Adrenals/Urinary Tract: Normal adrenals. No hydronephrosis. A few
scattered simple bilateral renal cysts, largest 1.6 cm in the
posterior interpolar right kidney. Additional subcentimeter
hypodense renal cortical lesions scattered in the kidneys are too
small to characterize and require no follow-up. Normal bladder.

Stomach/Bowel: Normal non-distended stomach. Normal caliber small
bowel with no small bowel wall thickening. Normal appendix. Oral
contrast transits to the right colon. Mild sigmoid diverticulosis
with no definite large bowel wall thickening or acute pericolonic
fat stranding.

Vascular/Lymphatic: Atherosclerotic abdominal aorta with stable
x 3.2 cm infrarenal abdominal aortic aneurysm. Patent portal,
splenic, hepatic and renal veins. No pathologically enlarged lymph
nodes in the abdomen or pelvis.

Reproductive: Mild prostatomegaly.

Other: No pneumoperitoneum, ascites or focal fluid collection.

Musculoskeletal: No aggressive appearing focal osseous lesions.
Marked lumbar spondylosis, most prominent at L4-5.
IMPRESSION: 1. New borderline mild splenomegaly. No abdominopelvic adenopathy.
2. Stable 3.3 cm infrarenal abdominal aortic aneurysm. Abdominal
Aortic Aneurysm (KYTIO-FN0.0). Recommend follow-up aortic ultrasound
in 3 years. This recommendation follows ACR consensus guidelines:
White Paper of the ACR Incidental Findings Committee II on Vascular
Findings. [HOSPITAL] 8877; [DATE].
3. Mild sigmoid diverticulosis.
4. Mild prostatomegaly.
5. Aortic Atherosclerosis (KYTIO-JNC.C).

## 2021-03-02 ENCOUNTER — Other Ambulatory Visit: Payer: Self-pay

## 2021-03-02 ENCOUNTER — Other Ambulatory Visit
Admission: RE | Admit: 2021-03-02 | Discharge: 2021-03-02 | Disposition: A | Discharge status: Home / Self Care | Payer: BC Managed Care – PPO | Source: Ambulatory Visit | Attending: Cardiology | Admitting: Cardiology

## 2021-03-02 DIAGNOSIS — G4730 Sleep apnea, unspecified: Secondary | ICD-10-CM | POA: Diagnosis not present

## 2021-03-02 DIAGNOSIS — Z20822 Contact with and (suspected) exposure to covid-19: Secondary | ICD-10-CM | POA: Insufficient documentation

## 2021-03-02 DIAGNOSIS — Z01812 Encounter for preprocedural laboratory examination: Secondary | ICD-10-CM | POA: Insufficient documentation

## 2021-03-02 DIAGNOSIS — Z8249 Family history of ischemic heart disease and other diseases of the circulatory system: Secondary | ICD-10-CM | POA: Diagnosis not present

## 2021-03-02 DIAGNOSIS — I4819 Other persistent atrial fibrillation: Secondary | ICD-10-CM | POA: Diagnosis not present

## 2021-03-02 DIAGNOSIS — Z79899 Other long term (current) drug therapy: Secondary | ICD-10-CM | POA: Diagnosis not present

## 2021-03-02 DIAGNOSIS — Z8042 Family history of malignant neoplasm of prostate: Secondary | ICD-10-CM | POA: Diagnosis not present

## 2021-03-02 DIAGNOSIS — Z87891 Personal history of nicotine dependence: Secondary | ICD-10-CM | POA: Diagnosis not present

## 2021-03-02 DIAGNOSIS — Z7901 Long term (current) use of anticoagulants: Secondary | ICD-10-CM | POA: Diagnosis not present

## 2021-03-02 DIAGNOSIS — I10 Essential (primary) hypertension: Secondary | ICD-10-CM | POA: Diagnosis not present

## 2021-03-02 DIAGNOSIS — D751 Secondary polycythemia: Secondary | ICD-10-CM | POA: Diagnosis not present

## 2021-03-02 DIAGNOSIS — Z833 Family history of diabetes mellitus: Secondary | ICD-10-CM | POA: Diagnosis not present

## 2021-03-02 LAB — SARS CORONAVIRUS 2 (TAT 6-24 HRS): SARS Coronavirus 2: NEGATIVE

## 2021-03-04 ENCOUNTER — Encounter
Admission: RE | Disposition: A | Discharge status: Home / Self Care | Payer: Self-pay | Source: Home / Self Care | Attending: Cardiology

## 2021-03-04 ENCOUNTER — Other Ambulatory Visit: Payer: Self-pay

## 2021-03-04 ENCOUNTER — Ambulatory Visit
Admission: RE | Admit: 2021-03-04 | Discharge: 2021-03-04 | Disposition: A | Discharge status: Home / Self Care | Payer: BC Managed Care – PPO | Attending: Cardiology | Admitting: Cardiology

## 2021-03-04 ENCOUNTER — Ambulatory Visit: Payer: BC Managed Care – PPO | Admitting: Anesthesiology

## 2021-03-04 ENCOUNTER — Encounter: Payer: Self-pay | Admitting: Cardiology

## 2021-03-04 DIAGNOSIS — D751 Secondary polycythemia: Secondary | ICD-10-CM | POA: Diagnosis not present

## 2021-03-04 DIAGNOSIS — Z20822 Contact with and (suspected) exposure to covid-19: Secondary | ICD-10-CM | POA: Insufficient documentation

## 2021-03-04 DIAGNOSIS — E7800 Pure hypercholesterolemia, unspecified: Secondary | ICD-10-CM | POA: Diagnosis not present

## 2021-03-04 DIAGNOSIS — Z8249 Family history of ischemic heart disease and other diseases of the circulatory system: Secondary | ICD-10-CM | POA: Insufficient documentation

## 2021-03-04 DIAGNOSIS — Z7901 Long term (current) use of anticoagulants: Secondary | ICD-10-CM | POA: Insufficient documentation

## 2021-03-04 DIAGNOSIS — I4819 Other persistent atrial fibrillation: Secondary | ICD-10-CM | POA: Diagnosis not present

## 2021-03-04 DIAGNOSIS — Z87891 Personal history of nicotine dependence: Secondary | ICD-10-CM | POA: Diagnosis not present

## 2021-03-04 DIAGNOSIS — Z8042 Family history of malignant neoplasm of prostate: Secondary | ICD-10-CM | POA: Diagnosis not present

## 2021-03-04 DIAGNOSIS — I10 Essential (primary) hypertension: Secondary | ICD-10-CM | POA: Insufficient documentation

## 2021-03-04 DIAGNOSIS — Z79899 Other long term (current) drug therapy: Secondary | ICD-10-CM | POA: Diagnosis not present

## 2021-03-04 DIAGNOSIS — Z833 Family history of diabetes mellitus: Secondary | ICD-10-CM | POA: Insufficient documentation

## 2021-03-04 DIAGNOSIS — N529 Male erectile dysfunction, unspecified: Secondary | ICD-10-CM | POA: Diagnosis not present

## 2021-03-04 DIAGNOSIS — G4730 Sleep apnea, unspecified: Secondary | ICD-10-CM | POA: Insufficient documentation

## 2021-03-04 DIAGNOSIS — I4891 Unspecified atrial fibrillation: Secondary | ICD-10-CM | POA: Diagnosis not present

## 2021-03-04 HISTORY — PX: CARDIOVERSION: SHX1299

## 2021-03-04 LAB — PROTIME-INR
INR: 1.1 (ref 0.8–1.2)
Prothrombin Time: 14.1 seconds (ref 11.4–15.2)

## 2021-03-04 SURGERY — CARDIOVERSION
Anesthesia: General

## 2021-03-04 MED ORDER — PROPOFOL 10 MG/ML IV BOLUS
INTRAVENOUS | Status: DC | PRN
  Administered 2021-03-04: 70 mg via INTRAVENOUS

## 2021-03-04 MED ORDER — SODIUM CHLORIDE 0.9 % IV SOLN: INTRAVENOUS | Status: DC | PRN

## 2021-03-04 MED ORDER — METOPROLOL TARTRATE 100 MG PO TABS: 50.0000 mg | ORAL_TABLET | Freq: Two times a day (BID) | ORAL | 3 refills | Status: DC

## 2021-03-04 NOTE — Transfer of Care (Signed)
Immediate Anesthesia Transfer of Care Note  Patient: Jason Bolton  Procedure(s) Performed: CARDIOVERSION (N/A )  Patient Location: Endoscopy Unit  Anesthesia Type:General  Level of Consciousness: drowsy and patient cooperative  Airway & Oxygen Therapy: Patient Spontanous Breathing and Patient connected to face mask oxygen  Post-op Assessment: Report given to RN and Post -op Vital signs reviewed and stable  Post vital signs: Reviewed and stable  Last Vitals:  Vitals Value Taken Time  BP 139/105 03/04/21 0803  Temp    Pulse 51 03/04/21 0806  Resp 33 03/04/21 0806  SpO2 97 % 03/04/21 0806  Vitals shown include unvalidated device data.  Last Pain:  Vitals:   03/04/21 0734  TempSrc: Oral  PainSc: 0-No pain         Complications: No complications documented.

## 2021-03-04 NOTE — Anesthesia Postprocedure Evaluation (Signed)
Anesthesia Post Note  Patient: Jason Bolton  Procedure(s) Performed: CARDIOVERSION (N/A )  Patient location during evaluation: Specials Recovery Anesthesia Type: General Level of consciousness: awake and alert and oriented Pain management: pain level controlled Vital Signs Assessment: post-procedure vital signs reviewed and stable Respiratory status: spontaneous breathing Cardiovascular status: blood pressure returned to baseline Anesthetic complications: no   No complications documented.   Last Vitals:  Vitals:   03/04/21 0805 03/04/21 0806  BP:    Pulse:  (!) 104  Resp: 17 (!) 32  Temp:    SpO2:  93%    Last Pain:  Vitals:   03/04/21 0734  TempSrc: Oral  PainSc: 0-No pain                 Deretha Ertle

## 2021-03-04 NOTE — Procedures (Signed)
Cardioversion procedure note For atrial fibrillation.  Procedure Details:  Consent: Risks of procedure as well as the alternatives and risks of each were explained to the (patient/caregiver).  Consent for procedure obtained.  Time Out: Verified patient identification, verified procedure, site/side was marked, verified correct patient position, special equipment/implants available, medications/allergies/relevent history reviewed, required imaging and test results available.  Performed  Patient placed on cardiac monitor, pulse oximetry, supplemental oxygen as necessary.   Sedation given: propofol IV, per anesthesia team  Pacer pads placed anterior and posterior chest.   Cardioverted 1 time(s).   Cardioverted at  [place]. Synchronized biphasic Converted to NSR   Evaluation: Findings: Post procedure EKG shows: NSR Complications: None Patient did tolerate procedure well.  Time Spent Directly with the Patient:  35 minutes   Kate Sable, M.D.

## 2021-03-04 NOTE — Anesthesia Preprocedure Evaluation (Addendum)
Anesthesia Evaluation  Patient identified by MRN, date of birth, ID band Patient awake    Reviewed: Allergy & Precautions, NPO status , Patient's Chart, lab work & pertinent test results  Airway Mallampati: III       Dental  (+) Teeth Intact   Pulmonary sleep apnea and Continuous Positive Airway Pressure Ventilation , COPD, former smoker,     + decreased breath sounds      Cardiovascular Exercise Tolerance: Good hypertension, Pt. on medications and Pt. on home beta blockers + dysrhythmias Atrial Fibrillation  Rhythm:Regular Rate:Normal     Neuro/Psych negative neurological ROS  negative psych ROS   GI/Hepatic negative GI ROS, Neg liver ROS,   Endo/Other  Morbid obesity  Renal/GU negative Renal ROS  negative genitourinary   Musculoskeletal negative musculoskeletal ROS (+)   Abdominal (+) + obese,   Peds negative pediatric ROS (+)  Hematology negative hematology ROS (+)   Anesthesia Other Findings Past Medical History: No date: Allergy No date: Hypercholesterolemia No date: Hypertension No date: Personal history of osteomyelitis     Comment:  of the jaw- 2005-mandibular surgery No date: Polycythemia No date: Sleep apnea EF 45-50%  Reproductive/Obstetrics                            Anesthesia Physical  Anesthesia Plan  ASA: III  Anesthesia Plan: General   Post-op Pain Management:    Induction: Intravenous  PONV Risk Score and Plan: 0  Airway Management Planned: Nasal Cannula  Additional Equipment:   Intra-op Plan:   Post-operative Plan:   Informed Consent: I have reviewed the patients History and Physical, chart, labs and discussed the procedure including the risks, benefits and alternatives for the proposed anesthesia with the patient or authorized representative who has indicated his/her understanding and acceptance.     Dental advisory given  Plan Discussed with:  Surgeon and CRNA  Anesthesia Plan Comments:         Anesthesia Quick Evaluation

## 2021-03-04 NOTE — Discharge Instructions (Signed)
Moderate Conscious Sedation, Adult, Care After This sheet gives you information about how to care for yourself after your procedure. Your health care provider may also give you more specific instructions. If you have problems or questions, contact your health care provider. What can I expect after the procedure? After the procedure, it is common to have:  Sleepiness for several hours.  Impaired judgment for several hours.  Difficulty with balance.  Vomiting if you eat too soon. Follow these instructions at home: For the time period you were told by your health care provider:  Rest.  Do not participate in activities where you could fall or become injured.  Do not drive or use machinery.  Do not drink alcohol.  Do not take sleeping pills or medicines that cause drowsiness.  Do not make important decisions or sign legal documents.  Do not take care of children on your own.      Eating and drinking  Follow the diet recommended by your health care provider.  Drink enough fluid to keep your urine pale yellow.  If you vomit: ? Drink water, juice, or soup when you can drink without vomiting. ? Make sure you have little or no nausea before eating solid foods.   General instructions  Take over-the-counter and prescription medicines only as told by your health care provider.  Have a responsible adult stay with you for the time you are told. It is important to have someone help care for you until you are awake and alert.  Do not smoke.  Keep all follow-up visits as told by your health care provider. This is important. Contact a health care provider if:  You are still sleepy or having trouble with balance after 24 hours.  You feel light-headed.  You keep feeling nauseous or you keep vomiting.  You develop a rash.  You have a fever.  You have redness or swelling around the IV site. Get help right away if:  You have trouble breathing.  You have new-onset confusion at  home. Summary  After the procedure, it is common to feel sleepy, have impaired judgment, or feel nauseous if you eat too soon.  Rest after you get home. Know the things you should not do after the procedure.  Follow the diet recommended by your health care provider and drink enough fluid to keep your urine pale yellow.  Get help right away if you have trouble breathing or new-onset confusion at home. This information is not intended to replace advice given to you by your health care provider. Make sure you discuss any questions you have with your health care provider. Document Revised: 03/14/2020 Document Reviewed: 10/11/2019 Elsevier Patient Education  2021 [place]. Hospital doctor cardioversion is the delivery of a jolt of electricity to restore a normal rhythm to the heart. A rhythm that is too fast or is not regular keeps the heart from pumping well. In this procedure, sticky patches or metal paddles are placed on the chest to deliver electricity to the heart from a device. This procedure may be done in an emergency if:  There is low or no blood pressure as a result of the heart rhythm.  Normal rhythm must be restored as fast as possible to protect the brain and heart from further damage.  It may save a life. This may also be a scheduled procedure for irregular or fast heart rhythms that are not immediately life-threatening. Tell a health care provider about:  Any allergies you have.  All medicines you are taking, including vitamins, herbs, eye drops, creams, and over-the-counter medicines.  Any problems you or family members have had with anesthetic medicines.  Any blood disorders you have.  Any surgeries you have had.  Any medical conditions you have.  Whether you are pregnant or may be pregnant. What are the risks? Generally, this is a safe procedure. However, problems may occur, including:  Allergic reactions to medicines.  A blood clot  that breaks free and travels to other parts of your body.  The possible return of an abnormal heart rhythm within hours or days after the procedure.  Your heart stopping (cardiac arrest). This is rare. What happens before the procedure? Medicines  Your health care provider may have you start taking: ? Blood-thinning medicines (anticoagulants) so your blood does not clot as easily. ? Medicines to help stabilize your heart rate and rhythm.  Ask your health care provider about: ? Changing or stopping your regular medicines. This is especially important if you are taking diabetes medicines or blood thinners. ? Taking medicines such as aspirin and ibuprofen. These medicines can thin your blood. Do not take these medicines unless your health care provider tells you to take them. ? Taking over-the-counter medicines, vitamins, herbs, and supplements. General instructions  Follow instructions from your health care provider about eating or drinking restrictions.  Plan to have someone take you home from the hospital or clinic.  If you will be going home right after the procedure, plan to have someone with you for 24 hours.  Ask your health care provider what steps will be taken to help prevent infection. These may include washing your skin with a germ-killing soap. What happens during the procedure?  An IV will be inserted into one of your veins.  Sticky patches (electrodes) or metal paddles may be placed on your chest.  You will be given a medicine to help you relax (sedative).  An electrical shock will be delivered. The procedure may vary among health care providers and hospitals.   What can I expect after the procedure?  Your blood pressure, heart rate, breathing rate, and blood oxygen level will be monitored until you leave the hospital or clinic.  Your heart rhythm will be watched to make sure it does not change.  You may have some redness on the skin where the shocks were  given. Follow these instructions at home:  Do not drive for 24 hours if you were given a sedative during your procedure.  Take over-the-counter and prescription medicines only as told by your health care provider.  Ask your health care provider how to check your pulse. Check it often.  Rest for 48 hours after the procedure or as told by your health care provider.  Avoid or limit your caffeine use as told by your health care provider.  Keep all follow-up visits as told by your health care provider. This is important. Contact a health care provider if:  You feel like your heart is beating too quickly or your pulse is not regular.  You have a serious muscle cramp that does not go away. Get help right away if:  You have discomfort in your chest.  You are dizzy or you feel faint.  You have trouble breathing or you are short of breath.  Your speech is slurred.  You have trouble moving an arm or leg on one side of your body.  Your fingers or toes turn cold or blue. Summary  Electrical cardioversion is  the delivery of a jolt of electricity to restore a normal rhythm to the heart.  This procedure may be done right away in an emergency or may be a scheduled procedure if the condition is not an emergency.  Generally, this is a safe procedure.  After the procedure, check your pulse often as told by your health care provider. This information is not intended to replace advice given to you by your health care provider. Make sure you discuss any questions you have with your health care provider. Document Revised: 06/18/2019 Document Reviewed: 06/18/2019 Elsevier Patient Education  [place].

## 2021-03-04 NOTE — Anesthesia Procedure Notes (Signed)
Procedure Name: General with mask airway Performed by: Fletcher-Harrison, Destyne Goodreau, CRNA Pre-anesthesia Checklist: Patient identified, Emergency Drugs available, Suction available and Patient being monitored Patient Re-evaluated:Patient Re-evaluated prior to induction Oxygen Delivery Method: Simple face mask Induction Type: IV induction Placement Confirmation: positive ETCO2 and CO2 detector Dental Injury: Teeth and Oropharynx as per pre-operative assessment        

## 2021-03-04 NOTE — Interval H&P Note (Signed)
History and Physical Interval Note:  03/04/2021 8:05 AM  Jason Bolton  has presented today for surgery, with the diagnosis of  Afib.  The various methods of treatment have been discussed with the patient and family. After consideration of risks, benefits and other options for treatment, the patient has consented to  Procedure(s): CARDIOVERSION (N/A) as a surgical intervention.  The patient's history has been reviewed, patient examined, no change in status, stable for surgery.  I have reviewed the patient's chart and labs.  Questions were answered to the patient's satisfaction.     Aaron Edelman Agbor-Etang

## 2021-03-05 ENCOUNTER — Encounter: Payer: Self-pay | Admitting: Cardiology

## 2021-03-10 NOTE — Progress Notes (Signed)
[facility]  [address], [address] [place], [place] [address] Phone: [phone]  Fax: [phone]   Office Visit: 03/11/21  Referring physician: Trinna Post, PA-C  Chief Complaint: Jason Bolton is a 65 y.o. 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  HPI: The patient was last seen in the hematology clinic on 01/07/2021. At that time, he felt "ok." He smoked 3-4 cigarettes per day.  His apartment did not have a carbon monoxide monitor. He had sleep apnea and used a CPAP. The CPAP was tested about 8-10 months before. He felt refreshed in the mornings. He felt off-balance at times. Exam was stable. Hematocrit was 56.9, hemoglobin 15.3, platelets 134,000, WBC 8,800. CO2 was 38 (high). Ferritin was 8 (low). Carbon monoxide was 11.0% (0-3.6%). Erythropoietin was 269.9 (2.6 - 18.5). CXR was negative for active cardiopulmonary disease.  Patient underwent cardioversion for atrial fibrillation on 03/04/2021 by Dr. Garen Lah. He is following up on Monday. His blood pressure medication was cut in half.  Labs on 02/04/2021 revealed a hematocrit of 57.4 and a hemoglobin of 16.1.  During the interim, he has been feeling much better. His balance has gone back to normal. He has a minor headache today. He feels that he needs a phlebotomy.  He is now on Eliquis 5 mg BID which has made him feel better. He uses a CPAP. He quit smoking about 2 months ago.    Past Medical History:  Diagnosis Date  . Allergy   . Hypercholesterolemia   . Hypertension   . 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 Polycythemia   . Sleep apnea     Past Surgical History:  Procedure Laterality Date  . CARDIOVERSION N/A 03/04/2021   Procedure: CARDIOVERSION;  Surgeon: Kate Sable, MD;  Location: [facility] ORS;  Service: Cardiovascular;  Laterality: N/A;  . CATARACT EXTRACTION Right 11/2013  . COLONOSCOPY  2006  .  COLONOSCOPY WITH PROPOFOL N/A 10/19/2017   Procedure: COLONOSCOPY WITH PROPOFOL;  Surgeon: [name], MD;  Location: [facility] ENDOSCOPY;  Service: Endoscopy;  Laterality: N/A;  . INGUINAL HERNIA REPAIR Right 1972  . MANDIBLE SURGERY  2005   [facility]    Family History  Problem Relation Age of Onset  . Diabetes Mother   . Congestive Heart Failure Mother   . Pancreatic cancer Father   . Healthy Sister   . Healthy Brother   . Healthy Brother   . Healthy Brother     Social History:  reports that he quit smoking about 5 years ago. His smoking use included cigarettes. He has a 30.00 pack-year smoking history. He has never used smokeless tobacco. He reports current alcohol use. He reports that he does not use drugs. He stopped smoking 2 years ago. He began smoking again 3 months ago.Patient is smoking 3-4 cigarettes again at this point. The patient was exposed to formaldehyde for 45 years and now his exposure is close to zero.He runs 2 funeral homes. 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  Allergies:  Allergies  Allergen Reactions  . Penicillins Other (See Comments)    unknown    Current Medications: Current Outpatient Medications  Medication Sig Dispense Refill  . apixaban (ELIQUIS) 5 MG TABS tablet Take 1 tablet (5 mg total) by mouth 2 (two) times daily. 60 tablet 1  . chlorthalidone (HYGROTON) 25 MG tablet Take 0.5 tablets (12.5 mg total) by mouth daily. (Patient taking differently: Take 12.5 mg  by mouth in the morning and at bedtime.) 45 tablet 3  . lisinopril (ZESTRIL) 20 MG tablet Take 1 tablet (20 mg total) by mouth daily. (Patient taking differently: Take 20 mg by mouth every evening.) 90 tablet 3  . metoprolol tartrate (LOPRESSOR) 100 MG tablet Take 0.5 tablets (50 mg total) by mouth 2 (two) times daily. 90 tablet 3  . simvastatin (ZOCOR) 20 MG tablet Take 1 tablet (20 mg total) by mouth at bedtime. 90 tablet 1  . tamsulosin (FLOMAX) 0.4 MG CAPS capsule TAKE 2 CAPSULES BY MOUTH DAILY  GENERIC EQUIVALENT FOR FLOMAX (Patient taking differently: Take 0.8 mg by mouth at bedtime.) 180 capsule 1  . Coenzyme Q10 50 MG CAPS Take 50 mg by mouth every evening. CO ENZYME Q-10, 50MG  (Oral Capsule)  1 po qd for 0 days  Quantity: 30.00;  Refills: 0   Ordered :31-Aug-2010  Margarita Rana MD;  Started 31-Mar-2009 Active Comments: DX: 272.0 (Patient not taking: No sig reported)    . meloxicam (MOBIC) 7.5 MG tablet TAKE 1 TABLET DAILY (Patient not taking: No sig reported) 90 tablet 1  . montelukast (SINGULAIR) 10 MG tablet Take one daily. (Patient not taking: Reported on 03/11/2021) 90 tablet 1  . OMEGA-3 FATTY ACIDS PO Take 1 capsule by mouth every evening. FISH-EPA, 1000MG  (Oral Capsule)  2 po bid for 0 days  Quantity: 0.00;  Refills: 0   Ordered :31-Aug-2010  Margarita Rana MD;  Started 19-June-2007 Active Comments: DX: 272.0 (Patient not taking: No sig reported)    . Polyethyl Glycol-Propyl Glycol (LUBRICANT EYE DROPS) 0.4-0.3 % SOLN Place 1-2 drops into both eyes 3 (three) times daily as needed (dry/irritated eyes). (Patient not taking: No sig reported)    . sildenafil (VIAGRA) 50 MG tablet TAKE 1 TABLET BY MOUTH AS NEEDED FOR ERECTILE DYSFUNCTION GENERIC EQUIVALENT FOR VIAGRA (Patient not taking: No sig reported) 8 tablet 3   No current facility-administered medications for this visit.    Review of Systems  Constitutional: Positive for weight loss (17-23 lbs). Negative for chills, diaphoresis, fever and malaise/fatigue.       Feels "much better."  HENT: Negative for congestion, ear discharge, ear pain, hearing loss, nosebleeds, sinus pain, sore throat and tinnitus.   Eyes: Negative for blurred vision.  Respiratory: Negative for cough, hemoptysis, sputum production and shortness of breath.        Sleep apnea on CPAP. He quit smoking 2 months ago.  Cardiovascular: Negative for chest pain, palpitations and leg swelling.  Gastrointestinal: Negative for abdominal pain, blood in stool, constipation,  diarrhea, heartburn, melena, nausea and vomiting.  Genitourinary: Negative for dysuria, frequency, hematuria and urgency.  Musculoskeletal: Negative for back pain, joint pain, myalgias and neck pain.  Skin: Negative for itching and rash.  Neurological: Positive for headaches (mild). Negative for dizziness, tingling, sensory change and weakness.  Endo/Heme/Allergies: Does not bruise/bleed easily.  Psychiatric/Behavioral: Negative for depression and memory loss. The patient is not nervous/anxious and does not have insomnia.   All other systems reviewed and are negative.  Performance status (ECOG): 0  Vital Signs: Blood pressure (!) 141/76, pulse (!) 54, temperature 98.8 F (37.1 C), temperature source Tympanic, resp. rate 16, height 6\' 1"  (1.854 m), weight 273 lb (123.8 kg), SpO2 92 %.  Physical Exam Vitals and nursing note reviewed.  Constitutional:      General: He is not in acute distress.    Appearance: He is not diaphoretic.  HENT:     Head: Normocephalic and atraumatic.  Comments: Pearline Cables hair    Mouth/Throat:     Mouth: Mucous membranes are moist.     Pharynx: Oropharynx is clear.  Eyes:     General: No scleral icterus.    Extraocular Movements: Extraocular movements intact.     Conjunctiva/sclera: Conjunctivae normal.     Pupils: Pupils are equal, round, and reactive to light.  Cardiovascular:     Rate and Rhythm: Normal rate and regular rhythm.     Heart sounds: Normal heart sounds. No murmur heard.   Pulmonary:     Effort: Pulmonary effort is normal. No respiratory distress.     Breath sounds: No wheezing or rales.  Chest:     Chest wall: No tenderness.  Breasts:     Right: No axillary adenopathy or supraclavicular adenopathy.     Left: No axillary adenopathy or supraclavicular adenopathy.    Abdominal:     General: Bowel sounds are normal. There is no distension.     Palpations: Abdomen is soft. There is no mass.     Tenderness: There is no abdominal  tenderness. There is no guarding or rebound.  Musculoskeletal:        General: No swelling or tenderness. Normal range of motion.     Cervical back: Normal range of motion and neck supple.     Right lower leg: No edema.     Left lower leg: No edema.  Lymphadenopathy:     Head:     Right side of head: No preauricular, posterior auricular or occipital adenopathy.     Left side of head: No preauricular, posterior auricular or occipital adenopathy.     Cervical: No cervical adenopathy.     Upper Body:     Right upper body: No supraclavicular or axillary adenopathy.     Left upper body: No supraclavicular or axillary adenopathy.     Lower Body: No right inguinal adenopathy. No left inguinal adenopathy.  Skin:    General: Skin is warm and dry.  Neurological:     Mental Status: He is alert and oriented to person, place, and time.  Psychiatric:        Behavior: Behavior normal.        Thought Content: Thought content normal.        Judgment: Judgment normal.    Appointment on 03/11/2021  Component Date Value Ref Range Status  . WBC 03/11/2021 8.7  4.0 - 10.5 K/uL Final  . RBC 03/11/2021 7.94* 4.22 - 5.81 MIL/uL Final  . Hemoglobin 03/11/2021 18.4* 13.0 - 17.0 g/dL Final  . HCT 03/11/2021 61.8* 39.0 - 52.0 % Final  . MCV 03/11/2021 77.8* 80.0 - 100.0 fL Final  . MCH 03/11/2021 23.2* 26.0 - 34.0 pg Final  . MCHC 03/11/2021 29.8* 30.0 - 36.0 g/dL Final  . RDW 03/11/2021 24.7* 11.5 - 15.5 % Final  . Platelets 03/11/2021 143* 150 - 400 K/uL Final  . nRBC 03/11/2021 0.0  0.0 - 0.2 % Final   Performed at [facility], [address], [place], [place] [address]  . Neutrophils Relative % 03/11/2021 PENDING  % Incomplete  . Neutro Abs 03/11/2021 PENDING  1.7 - 7.7 K/uL Incomplete  . Band Neutrophils 03/11/2021 PENDING  % Incomplete  . Lymphocytes Relative 03/11/2021 PENDING  % Incomplete  . Lymphs Abs 03/11/2021 PENDING  0.7 - 4.0 K/uL Incomplete  . Monocytes Relative 03/11/2021  PENDING  % Incomplete  . Monocytes Absolute 03/11/2021 PENDING  0.1 - 1.0 K/uL Incomplete  .  Eosinophils Relative 03/11/2021 PENDING  % Incomplete  . Eosinophils Absolute 03/11/2021 PENDING  0.0 - 0.5 K/uL Incomplete  . Basophils Relative 03/11/2021 PENDING  % Incomplete  . Basophils Absolute 03/11/2021 PENDING  0.0 - 0.1 K/uL Incomplete  . WBC Morphology 03/11/2021 PENDING   Incomplete  . RBC Morphology 03/11/2021 PENDING   Incomplete  . Smear Review 03/11/2021 PENDING   Incomplete  . Other 03/11/2021 PENDING  % Incomplete  . nRBC 03/11/2021 PENDING  0 /100 WBC Incomplete  . Metamyelocytes Relative 03/11/2021 PENDING  % Incomplete  . Myelocytes 03/11/2021 PENDING  % Incomplete  . Promyelocytes Relative 03/11/2021 PENDING  % Incomplete  . Blasts 03/11/2021 PENDING  % Incomplete  . Immature Granulocytes 03/11/2021 PENDING  % Incomplete  . Abs Immature Granulocytes 03/11/2021 PENDING  0.00 - 0.07 K/uL Incomplete    Assessment:  Jason Bolton is a 65 y.o. male withsecondary polycythemiasince 2013/2014. Etiology is felt secondary to sleep apnea. He uses CPAP. He has a 30 pack year smoking history. He stopped smoking in 2016. He does not use testosterone. Initial labs ruled out polycythemia rubra vera. JAK2 V617F and exon 12 were negative on 10/17/2017. Erythropoietin level was normal. He is on a baby aspirin.  Labs on 05/07/2020 revealed an erythropoietin 90.9 (2.6 - 18.5) and carbon monoxide 9.5% (0-3.6%). Testosterone was 183 (low). JAK2, exon 12-15, CALR, and MPL were negative.  He undergoes phlebotomy to keep his hematocrit < 48. Last phlebotomy was on 11/19/2020.   Ferritinhas been followed: 6 on 05/12/2017 5 on 10/17/2017, 6 on 11/16/2017, 12 on 05/02/2018, 6 on 11/10/2018, 6 on 05/15/2019, 4 on 10/31/2019, and 5 on 04/23/2020.Epo levelwas 25.3 on 10/17/2017,34.6 on 05/02/2018, 34.3 on 11/10/2018, 90.9 on 05/07/2020, 269.9 on 01/07/2021, and 16.8 on  03/11/2021.  Abdomen and pelvis CT on 06/17/2020 revealed new borderline mild splenomegaly (13.0 cm, previously 11.9 cm). There was no abdominopelvic adenopathy. There was a stable 3.3 cm infrarenal abdominal aortic aneurysm. Recommendation was follow-up aortic ultrasound in 3 years. There was mild sigmoid diverticulosis, mild prostatomegaly, and aortic atherosclerosis.  He received the [place] COVID-19 vaccine on 12/03/2019 and 12/24/2019  Symptomatically, he has been feeling much better. His balance is back to normal. He has a minor headache today. He feels that he needs a phlebotomy.  He is on Eliquis 5 mg BID. He uses CPAP for sleep apnea. He quit smoking about 2 months ago.  He has lost weight.  Plan: 1.   Labs today: CBC with diff 2.   Secondary polycythemia Hematocrit 61.8.  Hemoglobin 18.4.  MCV 77.8.             Hematocrit goal is < 48.    Hemoglobin goal is < 16.              Phlebotomy today.              He notes a headache when his HCT is 51-52 and relieved after phlebotomy.  Hematocrit has been persistently elevated despite low iron stores.   Work-up for polycythemia:     He had an elevated carbon monoxide level of 11.0 on 01/07/2021.    EPO level is elevated (unclear significance).   Work-up repeated with his CPAP machine working well.     Carbon monoxide 9.5% (high) on 05/07/2020.     Carbon monoxide monitoring in apartment in [place].    EPO level 90.9 on 05/07/2020, 269.9 on 01/07/2021, and 16.8 on 03/11/2021.    JAK, exon 12-15, CALR, MPL  were negative.  He stopped smoking 2 months a go.  Abdomen and pelvis CT on 06/17/2020 revealed borderline mild splenomegaly (13 cm).  CXR on 01/07/2021 revealed no active cardiopulmonary disease.  Discuss referral to pulmonary medicine.  Patient in agreement. 3. Elevated erythropoietin level EPO level 90.9 on 05/07/2020, 269.9 on 01/07/2021, and 16.8 (available after clinic) on 03/11/2021.  EPO level is  slightly elevated in secondary polycythemia. EPO level can be elevated in response to hypoxia or an erythropoietin secreting tumor. The degree of elevation does not reveal the underlying cause.   Differential diagnosis includes HCC, renal cell carcinoma, hemangioblastoma, and pheochromocytoma.   Abdomen and pelvis CT scan and CXR were unrevealing.  Continue to monitor.  4.   Health maintenance  Last colonoscopy was 10/19/2017.   Next colonoscopy in 3 years per patient.  Ferritin was 8 on 01/07/2021.  Patient denies any bleeding.   Low ferritin likely secondary to phlebotomies.  Continue to monitor. 5.   24 hour urine for metanephrines. 6.   Phlebotomy today. 7.   RN:  Please put patient on next heme board. 8.   RTC on 03/24/2021 for labs (HCT/Hgb) and +/- phlebotomy if Hgb > 16. 9.   RTC in 1 month for MD assessment, labs (HCT/Hgb), review of work-up and +/- phlebotomy.  I discussed the assessment and treatment plan with the patient.  The patient was provided an opportunity to ask questions and all were answered.  The patient agreed with the plan and demonstrated an understanding of the instructions.  The patient was advised to call back if the symptoms worsen or if the condition fails to improve as anticipated.  I provided 14 minutes of face-to-face time during this this encounter and > 50% was spent counseling as documented under my assessment and plan.  An additional 6+ minutes were spent reviewing his chart (Epic and Care Everywhere) including notes, labs, and imaging studies.    Lequita Asal, MD, PhD    03/11/2021, 2:30 PM   I, De Burrs, am acting as a scribe for Lequita Asal, MD.  I, Zachary Mike Gip, MD, have reviewed the above documentation for accuracy and completeness, and I agree with the above.

## 2021-03-11 ENCOUNTER — Inpatient Hospital Stay: Payer: BC Managed Care – PPO

## 2021-03-11 ENCOUNTER — Encounter: Payer: Self-pay | Admitting: Hematology and Oncology

## 2021-03-11 ENCOUNTER — Other Ambulatory Visit: Payer: Self-pay | Admitting: Hematology and Oncology

## 2021-03-11 ENCOUNTER — Other Ambulatory Visit: Payer: Self-pay

## 2021-03-11 ENCOUNTER — Inpatient Hospital Stay: Payer: BC Managed Care – PPO | Attending: Hematology and Oncology

## 2021-03-11 ENCOUNTER — Inpatient Hospital Stay (HOSPITAL_BASED_OUTPATIENT_CLINIC_OR_DEPARTMENT_OTHER): Payer: BC Managed Care – PPO | Admitting: Hematology and Oncology

## 2021-03-11 VITALS — BP 141/76 | HR 54 | Temp 98.8°F | Resp 16 | Ht 73.0 in | Wt 273.0 lb

## 2021-03-11 DIAGNOSIS — R718 Other abnormality of red blood cells: Secondary | ICD-10-CM | POA: Diagnosis not present

## 2021-03-11 DIAGNOSIS — D751 Secondary polycythemia: Secondary | ICD-10-CM | POA: Insufficient documentation

## 2021-03-11 LAB — CBC WITH DIFFERENTIAL/PLATELET
Abs Immature Granulocytes: 0.11 10*3/uL — ABNORMAL HIGH (ref 0.00–0.07)
Basophils Absolute: 0.1 10*3/uL (ref 0.0–0.1)
Basophils Relative: 1 %
Eosinophils Absolute: 0.1 10*3/uL (ref 0.0–0.5)
Eosinophils Relative: 2 %
HCT: 61.8 % — ABNORMAL HIGH (ref 39.0–52.0)
Hemoglobin: 18.4 g/dL — ABNORMAL HIGH (ref 13.0–17.0)
Immature Granulocytes: 1 %
Lymphocytes Relative: 17 %
Lymphs Abs: 1.5 10*3/uL (ref 0.7–4.0)
MCH: 23.2 pg — ABNORMAL LOW (ref 26.0–34.0)
MCHC: 29.8 g/dL — ABNORMAL LOW (ref 30.0–36.0)
MCV: 77.8 fL — ABNORMAL LOW (ref 80.0–100.0)
Monocytes Absolute: 0.8 10*3/uL (ref 0.1–1.0)
Monocytes Relative: 10 %
Neutro Abs: 6 10*3/uL (ref 1.7–7.7)
Neutrophils Relative %: 69 %
Platelets: 143 10*3/uL — ABNORMAL LOW (ref 150–400)
RBC: 7.94 MIL/uL — ABNORMAL HIGH (ref 4.22–5.81)
RDW: 24.7 % — ABNORMAL HIGH (ref 11.5–15.5)
WBC: 8.7 10*3/uL (ref 4.0–10.5)
nRBC: 0 % (ref 0.0–0.2)

## 2021-03-11 NOTE — Progress Notes (Signed)
Stated that his BP has been elevated since his metoprolol was cut in half by his cardiologist. Had to get a cardioversion on 4/6 and that is when he was told to take half of metoprolol.

## 2021-03-12 ENCOUNTER — Other Ambulatory Visit: Payer: Self-pay | Admitting: Cardiology

## 2021-03-12 LAB — CARBON MONOXIDE, BLOOD (PERFORMED AT REF LAB): Carbon Monoxide, Blood: 5.7 % — ABNORMAL HIGH (ref 0.0–3.6)

## 2021-03-12 LAB — ERYTHROPOIETIN: Erythropoietin: 16.8 m[IU]/mL (ref 2.6–18.5)

## 2021-03-12 MED ORDER — APIXABAN 5 MG PO TABS: 5.0000 mg | ORAL_TABLET | Freq: Two times a day (BID) | ORAL | 5 refills | Status: DC

## 2021-03-12 NOTE — Telephone Encounter (Signed)
Received fax from [facility] on [address] requesting refills for Eliquis 5 mg BID.  Routing to PharmD to advise.

## 2021-03-12 NOTE — Telephone Encounter (Signed)
Age 65, weight 124kg, SCr 0.9 on 02/23/21, last visit 02/12/21, afib indication, refill sent in

## 2021-03-16 ENCOUNTER — Ambulatory Visit: Payer: BC Managed Care – PPO | Admitting: Cardiology

## 2021-03-16 ENCOUNTER — Other Ambulatory Visit: Payer: Self-pay

## 2021-03-16 ENCOUNTER — Encounter: Payer: Self-pay | Admitting: Cardiology

## 2021-03-16 VITALS — BP 132/74 | HR 54 | Ht 73.0 in | Wt 278.0 lb

## 2021-03-16 DIAGNOSIS — I480 Paroxysmal atrial fibrillation: Secondary | ICD-10-CM

## 2021-03-16 DIAGNOSIS — E7800 Pure hypercholesterolemia, unspecified: Secondary | ICD-10-CM

## 2021-03-16 DIAGNOSIS — I10 Essential (primary) hypertension: Secondary | ICD-10-CM

## 2021-03-16 MED ORDER — LISINOPRIL 20 MG PO TABS: 20.0000 mg | ORAL_TABLET | Freq: Every day | ORAL | 1 refills | Status: DC

## 2021-03-16 MED ORDER — CHLORTHALIDONE 25 MG PO TABS: 12.5000 mg | ORAL_TABLET | Freq: Every day | ORAL | 1 refills | Status: DC

## 2021-03-16 MED ORDER — APIXABAN 5 MG PO TABS: 5.0000 mg | ORAL_TABLET | Freq: Two times a day (BID) | ORAL | 1 refills | Status: DC

## 2021-03-16 NOTE — Progress Notes (Signed)
Cardiology Office Note:    Date:  03/16/2021   ID:  Jason Bolton, DOB May 19, 1956, MRN [id number]  PCP:  Pollak, Adriana M, [place]  Cardiologist:  Kate Sable, MD  Advanced Practice Provider:  No care team member to display Electrophysiologist:  None       Referring MD: Trinna Post, PA-C   Chief Complaint  Patient presents with  . Other    Follow up post Cardioversion. Meds reviewed verbally with patient.      History of Present Illness:    Jason Bolton is a 66 y.o. male with a hx of paroxysmal atrial fibrillation (DCCV 02/2021), hypertension, hyperlipidemia, sleep apnea on CPAP, former smoker x40+ years who presents for follow-up.  Previously seen for atrial fibrillation, DC cardioversion performed on 03/04/2021 successfully.  Patient feels well overall.  Denies palpitations or dizziness.  BP checks at home with systolic usually in the 831D to 130s.  Has no other concerns at this time.  Needs refills on his heart medicines.   Prior notes Echocardiogram 02/05/2021 showed mildly reduced ejection fraction, EF 45 to 50%.  Mildly dilated LA, severely dilated RA. DC cardioversion 02/2021 successfully.  Past Medical History:  Diagnosis Date  . Allergy   . Hypercholesterolemia   . Hypertension   . 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 Polycythemia   . Sleep apnea     Past Surgical History:  Procedure Laterality Date  . CARDIOVERSION N/A 03/04/2021   Procedure: CARDIOVERSION;  Surgeon: Kate Sable, MD;  Location: [facility] ORS;  Service: Cardiovascular;  Laterality: N/A;  . CATARACT EXTRACTION Right 11/2013  . COLONOSCOPY  2006  . COLONOSCOPY WITH PROPOFOL N/A 10/19/2017   Procedure: COLONOSCOPY WITH PROPOFOL;  Surgeon: [name], MD;  Location: [facility] ENDOSCOPY;  Service: Endoscopy;  Laterality: N/A;  . INGUINAL HERNIA REPAIR Right 1972  . MANDIBLE SURGERY  2005   [facility]     Current Medications: Current Meds  Medication Sig  . Coenzyme Q10 50 MG CAPS Take 50 mg by mouth every evening. CO ENZYME Q-10, 50MG  (Oral Capsule)  1 po qd for 0 days  Quantity: 30.00;  Refills: 0   Ordered :31-Aug-2010  Margarita Rana MD;  Started 31-Mar-2009 Active Comments: DX: 272.0  . meloxicam (MOBIC) 7.5 MG tablet TAKE 1 TABLET DAILY  . metoprolol tartrate (LOPRESSOR) 100 MG tablet Take 0.5 tablets (50 mg total) by mouth 2 (two) times daily.  . montelukast (SINGULAIR) 10 MG tablet Take one daily.  . OMEGA-3 FATTY ACIDS PO Take 1 capsule by mouth every evening. FISH-EPA, 1000MG  (Oral Capsule)  2 po bid for 0 days  Quantity: 0.00;  Refills: 0   Ordered :31-Aug-2010  Margarita Rana MD;  Started 19-June-2007 Active Comments: DX: 272.0  . Polyethyl Glycol-Propyl Glycol (LUBRICANT EYE DROPS) 0.4-0.3 % SOLN Place 1-2 drops into both eyes 3 (three) times daily as needed (dry/irritated eyes).  . sildenafil (VIAGRA) 50 MG tablet TAKE 1 TABLET BY MOUTH AS NEEDED FOR ERECTILE DYSFUNCTION GENERIC EQUIVALENT FOR VIAGRA  . simvastatin (ZOCOR) 20 MG tablet Take 1 tablet (20 mg total) by mouth at bedtime.  . tamsulosin (FLOMAX) 0.4 MG CAPS capsule TAKE 2 CAPSULES BY MOUTH DAILY GENERIC EQUIVALENT FOR FLOMAX  . [DISCONTINUED] apixaban (ELIQUIS) 5 MG TABS tablet Take 1 tablet (5 mg total) by mouth 2 (two) times daily.  . [DISCONTINUED] chlorthalidone (HYGROTON) 25 MG tablet Take 0.5 tablets (12.5 mg  total) by mouth daily.  . [DISCONTINUED] lisinopril (ZESTRIL) 20 MG tablet Take 1 tablet (20 mg total) by mouth daily.     Allergies:   Penicillins   Social History   Socioeconomic History  . Marital status: Married    Spouse name: Not on file  . Number of children: 2  . Years of education: Not on file  . Highest education level: Not on file  Occupational History  . Occupation: Librarian, academic  Tobacco Use  . Smoking status: Former Smoker    Packs/day: 1.50    Years: 20.00    Pack years: 30.00     Types: Cigarettes    Quit date: 11/29/2015    Years since quitting: 5.2  . Smokeless tobacco: Never Used  . Tobacco comment: quit June of 2008 after [employer]. he was exposed to secondary smoke  Vaping Use  . Vaping Use: Never used  Substance and Sexual Activity  . Alcohol use: Yes    Alcohol/week: 0.0 standard drinks    Comment: occasional  . Drug use: No  . Sexual activity: Not on file  Other Topics Concern  . Not on file  Social History Narrative  . Not on file   Social Determinants of Health   Financial Resource Strain: Not on file  Food Insecurity: Not on file  Transportation Needs: Not on file  Physical Activity: Not on file  Stress: Not on file  Social Connections: Not on file     Family History: The patient's family history includes Congestive Heart Failure in his mother; Diabetes in his mother; Healthy in his brother, brother, brother, and sister; Pancreatic cancer in his father.  ROS:   Please see the history of present illness.     All other systems reviewed and are negative.  EKGs/Labs/Other Studies Reviewed:    The following studies were reviewed today:   EKG:  EKG is  ordered today.  The ekg ordered today demonstrates sinus bradycardia, heart rate 54.  Recent Labs: 01/14/2021: ALT 16; TSH 1.880 02/16/2021: B Natriuretic Peptide 256.3; Magnesium 1.8 02/23/2021: BUN 17; Creatinine, Ser 0.90; Potassium 4.2; Sodium 138 03/11/2021: Hemoglobin 18.4; Platelets 143  Recent Lipid Panel    Component Value Date/Time   CHOL 122 01/14/2021 0913   TRIG 73 01/14/2021 0913   HDL 35 (L) 01/14/2021 0913   CHOLHDL 3.5 01/14/2021 0913   LDLCALC 72 01/14/2021 0913     Risk Assessment/Calculations:      Physical Exam:    VS:  BP 132/74 (BP Location: Left Arm, Patient Position: Sitting, Cuff Size: Normal)   Pulse (!) 54   Ht 6\' 1"  (1.854 m)   Wt 278 lb (126.1 kg)   SpO2 96%   BMI 36.68 kg/m     Wt Readings from Last 3 Encounters:  03/16/21 278 lb (126.1 kg)   03/11/21 273 lb (123.8 kg)  03/04/21 278 lb (126.1 kg)     GEN:  Well nourished, well developed in no acute distress HEENT: Normal NECK: No JVD; No carotid bruits LYMPHATICS: No lymphadenopathy CARDIAC: Irregular irregular, no murmurs RESPIRATORY:  Clear to auscultation without rales, wheezing or rhonchi  ABDOMEN: Soft, non-tender, non-distended MUSCULOSKELETAL:  No edema; No deformity  SKIN: Warm and dry NEUROLOGIC:  Alert and oriented x 3 PSYCHIATRIC:  Normal affect   ASSESSMENT:    1. Paroxysmal atrial fibrillation (HCC)   2. Primary hypertension   3. Pure hypercholesterolemia    PLAN:    In order of problems listed above:  1. Persistent  atrial fibrillation s/p DCCV 02/2021, CHA2DS2-VASc score 2 (htn, age).  Maintaining sinus rhythm.  Patient is asymptomatic.  Continue Eliquis, Lopressor 50 mg twice daily.  Echo with mildly reduced EF, likely from A. fib.  Plan to repeat echo in about 6 months.. 2. Hypertension, BP control, continue lisinopril, chlorthalidone. 3. Hyperlipidemia, continue simvastatin  Follow-up in 6 months.    Medication Adjustments/Labs and Tests Ordered: Current medicines are reviewed at length with the patient today.  Concerns regarding medicines are outlined above.  Orders Placed This Encounter  Procedures  . EKG 12-Lead   Meds ordered this encounter  Medications  . chlorthalidone (HYGROTON) 25 MG tablet    Sig: Take 0.5 tablets (12.5 mg total) by mouth daily.    Dispense:  45 tablet    Refill:  1  . lisinopril (ZESTRIL) 20 MG tablet    Sig: Take 1 tablet (20 mg total) by mouth daily.    Dispense:  90 tablet    Refill:  1    Patient Instructions  Medication Instructions:   Your physician recommends that you continue on your current medications as directed. Please refer to the Current Medication list given to you today.  *If you need a refill on your cardiac medications before your next appointment, please call your pharmacy*   Lab  Work: None ordered If you have labs (blood work) drawn today and your tests are completely normal, you will receive your results only by: Marland Kitchen MyChart Message (if you have MyChart) OR . A paper copy in the mail If you have any lab test that is abnormal or we need to change your treatment, we will call you to review the results.   Testing/Procedures: None ordered   Follow-Up: At [facility], you and your health needs are our priority.  As part of our continuing mission to provide you with exceptional heart care, we have created designated Provider Care Teams.  These Care Teams include your primary Cardiologist (physician) and Advanced Practice Providers (APPs -  Physician Assistants and Nurse Practitioners) who all work together to provide you with the care you need, when you need it.  We recommend signing up for the patient portal called "MyChart".  Sign up information is provided on this After Visit Summary.  MyChart is used to connect with patients for Virtual Visits (Telemedicine).  Patients are able to view lab/test results, encounter notes, upcoming appointments, etc.  Non-urgent messages can be sent to your provider as well.   To learn more about what you can do with MyChart, go to [email or web address].    Your next appointment:   6 month(s)  The format for your next appointment:   In Person  Provider:   Kate Sable, MD   Other Instructions       Signed, Kate Sable, MD  03/16/2021 11:10 AM    [place]

## 2021-03-16 NOTE — Patient Instructions (Signed)

## 2021-03-24 ENCOUNTER — Other Ambulatory Visit: Payer: BC Managed Care – PPO

## 2021-03-24 ENCOUNTER — Other Ambulatory Visit: Payer: Self-pay

## 2021-03-24 DIAGNOSIS — R718 Other abnormality of red blood cells: Secondary | ICD-10-CM

## 2021-03-24 DIAGNOSIS — D751 Secondary polycythemia: Secondary | ICD-10-CM

## 2021-03-31 ENCOUNTER — Telehealth: Payer: Self-pay | Admitting: Cardiology

## 2021-03-31 MED ORDER — CHLORTHALIDONE 25 MG PO TABS: 12.5000 mg | ORAL_TABLET | Freq: Every day | ORAL | 1 refills | Status: DC

## 2021-03-31 NOTE — Telephone Encounter (Signed)
Rx request sent to pharmacy.  

## 2021-03-31 NOTE — Telephone Encounter (Signed)
*  STAT* If patient is at the pharmacy, call can be transferred to refill team.   1. Which medications need to be refilled? (please list name of each medication and dose if known)  Chlorthalidone 25 MG 0.5 tablet daily   (patient had been taking incorrectly, 0.5 tablet 2 times daily which is why he is out of medication)   2. Which pharmacy/location (including street and city if local pharmacy) is medication to be sent to? [facility] near [employer]  3. Do they need a 30 day or 90 day supply? 90 day

## 2021-04-08 ENCOUNTER — Inpatient Hospital Stay: Payer: BC Managed Care – PPO

## 2021-04-08 ENCOUNTER — Inpatient Hospital Stay: Payer: BC Managed Care – PPO | Attending: Oncology

## 2021-04-08 ENCOUNTER — Inpatient Hospital Stay: Payer: BC Managed Care – PPO | Admitting: Oncology

## 2021-04-08 ENCOUNTER — Other Ambulatory Visit: Payer: Self-pay

## 2021-04-08 VITALS — BP 139/81 | HR 54 | Temp 98.2°F | Resp 18

## 2021-04-08 DIAGNOSIS — Z452 Encounter for adjustment and management of vascular access device: Secondary | ICD-10-CM | POA: Diagnosis not present

## 2021-04-08 DIAGNOSIS — R718 Other abnormality of red blood cells: Secondary | ICD-10-CM

## 2021-04-08 DIAGNOSIS — D751 Secondary polycythemia: Secondary | ICD-10-CM | POA: Diagnosis not present

## 2021-04-08 LAB — FERRITIN: Ferritin: 13 ng/mL — ABNORMAL LOW (ref 24–336)

## 2021-04-08 LAB — HEMOGLOBIN AND HEMATOCRIT, BLOOD
HCT: 58.7 % — ABNORMAL HIGH (ref 39.0–52.0)
Hemoglobin: 17.8 g/dL — ABNORMAL HIGH (ref 13.0–17.0)

## 2021-04-08 NOTE — Progress Notes (Deleted)
[facility]  [address], [address] [place], [place] [address] Phone: [phone]  Fax: [phone]   Office Visit: 04/08/21  Referring physician: Trinna Post, PA-C  Chief Complaint: Jason Bolton is a 65 y.o. male with secondary polycythemia who is seen for 3 month assessment.  HPI: The patient was last seen in the hematology clinic on 01/07/2021. At that time, he has been "ok." He still smokes 3-4 cigarettes per day. He works in [employer] and when he is there, he lives in an apartment that does not have a carbon monoxide monitor. His actual house is in [place], [place] does have a carbon monoxide monitor. He has sleep apnea and uses a CPAP. The CPAP was tested about 8-10 months ago. He feels refreshed in the mornings. He feels off-balance at times. He drinks fluids.   The patient has white coat hypertension. He does not have a pulmonologist.  Abdomen and pelvis CT on 06/17/2020 revealed new borderline mild splenomegaly (13.0 cm, previously 11.9 cm). There was no abdominopelvic adenopathy. There was a stable 3.3 cm infrarenal abdominal aortic aneurysm. Recommendation was follow-up aortic ultrasound in 3 years. There was mild sigmoid diverticulosis, mild prostatomegaly, and aortic atherosclerosis.  **Labs followed: 07/07/2020: Hematocrit 47.4, hemoglobin 13.5, platelets 144,000, WBC   8,500. Ferritin 4. 08/11/2020: Hematocrit 51.9, hemoglobin 14.6, platelets 143,000, WBC 10,000. Ferritin 3. 09/02/2020: Hematocrit 52.9, hemoglobin 14.6, platelets 152,000, WBC   9,000. Ferritin 4. 09/25/2020: Hematocrit 52.8, hemoglobin 14.7, platelets 118,000, WBC   8,500. Ferritin 5. 11/19/2020: Hematocrit 55.9, hemoglobin 15.3, platelets 130,000, WBC   9,900. Ferritin 9.  He underwent therapeutic phlebotomy on 09/15/2020 and 11/19/2020.  ***During the interim,    Past Medical History:  Diagnosis Date  . Allergy   . Hypercholesterolemia   . Hypertension   .  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 Polycythemia   . Sleep apnea     Past Surgical History:  Procedure Laterality Date  . CARDIOVERSION N/A 03/04/2021   Procedure: CARDIOVERSION;  Surgeon: Kate Sable, MD;  Location: [facility] ORS;  Service: Cardiovascular;  Laterality: N/A;  . CATARACT EXTRACTION Right 11/2013  . COLONOSCOPY  2006  . COLONOSCOPY WITH PROPOFOL N/A 10/19/2017   Procedure: COLONOSCOPY WITH PROPOFOL;  Surgeon: [name], MD;  Location: [facility] ENDOSCOPY;  Service: Endoscopy;  Laterality: N/A;  . INGUINAL HERNIA REPAIR Right 1972  . MANDIBLE SURGERY  2005   [facility]    Family History  Problem Relation Age of Onset  . Diabetes Mother   . Congestive Heart Failure Mother   . Pancreatic cancer Father   . Healthy Sister   . Healthy Brother   . Healthy Brother   . Healthy Brother     Social History:  reports that he quit smoking about 5 years ago. His smoking use included cigarettes. He has a 30.00 pack-year smoking history. He has never used smokeless tobacco. He reports current alcohol use. He reports that he does not use drugs. He stopped smoking 2 years ago. He began smoking again 3 months ago.Patient is smoking 3-4 cigarettes again at this point. The patient was exposed to formaldehyde for 45 years and now his exposure is close to zero.He runs 2 funeral homes. 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  Allergies:  Allergies  Allergen Reactions  . Penicillins Other (See Comments)    unknown    Current Medications: Current Outpatient Medications  Medication Sig Dispense Refill  . apixaban (ELIQUIS) 5 MG  TABS tablet Take 1 tablet (5 mg total) by mouth 2 (two) times daily. 180 tablet 1  . chlorthalidone (HYGROTON) 25 MG tablet Take 0.5 tablets (12.5 mg total) by mouth daily. 45 tablet 1  . Coenzyme Q10 50 MG CAPS Take 50 mg by mouth every evening. CO ENZYME Q-10, 50MG  (Oral Capsule)  1 po qd for 0 days  Quantity: 30.00;  Refills: 0    Ordered :31-Aug-2010  Margarita Rana MD;  Started 31-Mar-2009 Active Comments: DX: 272.0    . lisinopril (ZESTRIL) 20 MG tablet Take 1 tablet (20 mg total) by mouth daily. 90 tablet 1  . meloxicam (MOBIC) 7.5 MG tablet TAKE 1 TABLET DAILY 90 tablet 1  . metoprolol tartrate (LOPRESSOR) 100 MG tablet Take 0.5 tablets (50 mg total) by mouth 2 (two) times daily. 90 tablet 3  . montelukast (SINGULAIR) 10 MG tablet Take one daily. 90 tablet 1  . OMEGA-3 FATTY ACIDS PO Take 1 capsule by mouth every evening. FISH-EPA, 1000MG  (Oral Capsule)  2 po bid for 0 days  Quantity: 0.00;  Refills: 0   Ordered :31-Aug-2010  Margarita Rana MD;  Started 19-June-2007 Active Comments: DX: 272.0    . Polyethyl Glycol-Propyl Glycol (LUBRICANT EYE DROPS) 0.4-0.3 % SOLN Place 1-2 drops into both eyes 3 (three) times daily as needed (dry/irritated eyes).    . sildenafil (VIAGRA) 50 MG tablet TAKE 1 TABLET BY MOUTH AS NEEDED FOR ERECTILE DYSFUNCTION GENERIC EQUIVALENT FOR VIAGRA 8 tablet 3  . simvastatin (ZOCOR) 20 MG tablet Take 1 tablet (20 mg total) by mouth at bedtime. 90 tablet 1  . tamsulosin (FLOMAX) 0.4 MG CAPS capsule TAKE 2 CAPSULES BY MOUTH DAILY GENERIC EQUIVALENT FOR FLOMAX 180 capsule 1   No current facility-administered medications for this visit.    Review of Systems  Constitutional: Negative for chills, diaphoresis, fever, malaise/fatigue and weight loss (up 6 lbs).       Feels "okay."  HENT: Negative for congestion, ear discharge, ear pain, hearing loss, nosebleeds, sinus pain, sore throat and tinnitus.   Eyes: Negative for blurred vision.  Respiratory: Negative for cough, hemoptysis, sputum production and shortness of breath.        Sleep apnea on CPAP. Smoking 3-4 cigarettes per day.  Cardiovascular: Negative for chest pain, palpitations and leg swelling.  Gastrointestinal: Negative for abdominal pain, blood in stool, constipation, diarrhea, heartburn, melena, nausea and vomiting.  Genitourinary: Negative  for dysuria, frequency, hematuria and urgency.  Musculoskeletal: Negative for back pain, joint pain, myalgias and neck pain.  Skin: Negative for itching and rash.  Neurological: Negative for dizziness, tingling, sensory change, weakness and headaches.       Off balance at times.  Endo/Heme/Allergies: Does not bruise/bleed easily.  Psychiatric/Behavioral: Negative for depression and memory loss. The patient is not nervous/anxious and does not have insomnia.   All other systems reviewed and are negative.  Performance status (ECOG): 0  Vital Signs*** There were no vitals taken for this visit.  Physical Exam Vitals and nursing note reviewed.  Constitutional:      General: He is not in acute distress.    Appearance: He is not diaphoretic.  HENT:     Head: Normocephalic and atraumatic.     Comments: Pearline Cables hair    Mouth/Throat:     Mouth: Mucous membranes are dry.     Pharynx: Oropharynx is clear.  Eyes:     General: No scleral icterus.    Extraocular Movements: Extraocular movements intact.  Conjunctiva/sclera: Conjunctivae normal.     Pupils: Pupils are equal, round, and reactive to light.  Cardiovascular:     Rate and Rhythm: Normal rate and regular rhythm.     Heart sounds: Normal heart sounds. No murmur heard.   Pulmonary:     Effort: Pulmonary effort is normal. No respiratory distress.     Breath sounds: Wheezing (every now and then) present. No rales.  Chest:     Chest wall: No tenderness.  Breasts:     Right: No axillary adenopathy or supraclavicular adenopathy.     Left: No axillary adenopathy or supraclavicular adenopathy.    Abdominal:     General: Bowel sounds are normal. There is no distension.     Palpations: Abdomen is soft. There is no mass.     Tenderness: There is no abdominal tenderness. There is no guarding or rebound.  Musculoskeletal:        General: No swelling or tenderness. Normal range of motion.     Cervical back: Normal range of motion and  neck supple.     Right lower leg: Edema (chronic) present.     Left lower leg: Edema (chronic) present.  Lymphadenopathy:     Head:     Right side of head: No preauricular, posterior auricular or occipital adenopathy.     Left side of head: No preauricular, posterior auricular or occipital adenopathy.     Cervical: No cervical adenopathy.     Upper Body:     Right upper body: No supraclavicular or axillary adenopathy.     Left upper body: No supraclavicular or axillary adenopathy.     Lower Body: No right inguinal adenopathy. No left inguinal adenopathy.  Skin:    General: Skin is warm and dry.  Neurological:     Mental Status: He is alert and oriented to person, place, and time.  Psychiatric:        Behavior: Behavior normal.        Thought Content: Thought content normal.        Judgment: Judgment normal.    No visits with results within 3 Day(s) from this visit.  Latest known visit with results is:  Appointment on 03/11/2021  Component Date Value Ref Range Status  . WBC 03/11/2021 8.7  4.0 - 10.5 K/uL Final  . RBC 03/11/2021 7.94* 4.22 - 5.81 MIL/uL Final  . Hemoglobin 03/11/2021 18.4* 13.0 - 17.0 g/dL Final  . HCT 03/11/2021 61.8* 39.0 - 52.0 % Final  . MCV 03/11/2021 77.8* 80.0 - 100.0 fL Final  . MCH 03/11/2021 23.2* 26.0 - 34.0 pg Final  . MCHC 03/11/2021 29.8* 30.0 - 36.0 g/dL Final  . RDW 03/11/2021 24.7* 11.5 - 15.5 % Final  . Platelets 03/11/2021 143* 150 - 400 K/uL Final  . nRBC 03/11/2021 0.0  0.0 - 0.2 % Final  . Neutrophils Relative % 03/11/2021 69  % Final  . Neutro Abs 03/11/2021 6.0  1.7 - 7.7 K/uL Final  . Lymphocytes Relative 03/11/2021 17  % Final  . Lymphs Abs 03/11/2021 1.5  0.7 - 4.0 K/uL Final  . Monocytes Relative 03/11/2021 10  % Final  . Monocytes Absolute 03/11/2021 0.8  0.1 - 1.0 K/uL Final  . Eosinophils Relative 03/11/2021 2  % Final  . Eosinophils Absolute 03/11/2021 0.1  0.0 - 0.5 K/uL Final  . Basophils Relative 03/11/2021 1  % Final  .  Basophils Absolute 03/11/2021 0.1  0.0 - 0.1 K/uL Final  . Immature Granulocytes 03/11/2021 1  %  Final  . Abs Immature Granulocytes 03/11/2021 0.11* 0.00 - 0.07 K/uL Final   Performed at [facility], [address], [place], [place] [address]  . Erythropoietin 03/11/2021 16.8  2.6 - 18.5 mIU/mL Final   Comment: (NOTE) [facility] [employer] DxI [place] obtained with different assay methods or kits cannot be used interchangeably. Results cannot be interpreted as absolute evidence of the presence or absence of malignant disease. Performed At: [facility] [place], [place] [id number] Rush Farmer MD [id number]   . Carbon Monoxide, Blood 03/11/2021 5.7* 0.0 - 3.6 % Final   Comment: (NOTE)                            Environmental Exposure:                             Nonsmokers           <3.7                             Smokers              <9.9                            Occupational Exposure:                             BEI                   3.5                                Detection Limit =  0.2 Performed At: [facility] [place], [place] [id number] Rush Farmer MD DJ:[id number]     Assessment:  Jason Bolton is a 65 y.o. male withsecondary polycythemiasince 2013/2014. Etiology is felt secondary to sleep apnea. He uses CPAP. He has a 30 pack year smoking history. He stopped smoking in 2016. He does not use testosterone. Initial labs ruled out polycythemia rubra vera. JAK2 V617F and exon 12 were negative on 10/17/2017. Erythropoietin level was normal. He is on a baby aspirin.  Labs on 05/07/2020 revealed an erythropoietin 90.9 (2.6 - 18.5) and carbon monoxide 9.5% (0-3.6%). Testosterone was 183 (low). JAK2, exon 12-15, CALR, and MPL were negative.  He undergoes phlebotomy to keep his hematocrit < 48. Last phlebotomy was on 11/19/2020.   Ferritinhas been followed: 6 on  05/12/2017 5 on 10/17/2017, 6 on 11/16/2017, 12 on 05/02/2018, 6 on 11/10/2018, 6 on 05/15/2019, 4 on 10/31/2019, and 5 on 04/23/2020.Epo levelwas 25.3 on 10/17/2017,34.6 on 05/02/2018, and 34.3 on 11/10/2018.  Abdomen and pelvis CT on 06/17/2020 revealed new borderline mild splenomegaly (13.0 cm, previously 11.9 cm). There was no abdominopelvic adenopathy. There was a stable 3.3 cm infrarenal abdominal aortic aneurysm. Recommendation was follow-up aortic ultrasound in 3 years. There was mild sigmoid diverticulosis, mild prostatomegaly, and aortic atherosclerosis.  He received the [employer] COVID-19 vaccine on 12/03/2019 and 12/24/2019  Symptomatically, he feels "ok." He smokes 3-4 cigarettes per day.  His apartment in [place], [place] does not have a carbon monoxide monitor. He has sleep apnea and uses a CPAP. The CPAP was  tested about 8-10 months ago. He feels refreshed in the mornings. He feels off-balance at times. Exam is stable.  Plan: 1.   Labs today: CBC with diff, ferritin, Metanephrine (Urine).  2.   Secondary polycythemia  HCT 61.8  HGB 18.4   MCV 77.8 (on 03/11/2021).  Hematocrit 56.9.  Hemoglobin 15.3.  MCV 77.0.             Hematocrit goal is < 48.    Hemoglobin goal is < 16.             He last underwent phlebotomy on 11/19/2020.               He notes a headache when his HCT is 51-52 and relieved after phlebotomy.  Hematocrit has been persistently elevated despite low iron stores.   Work-up for polycythemia:     He had an elevated carbon monoxide level of 9.1% on 10/17/2017.    EPO level is elevated (unclear significance).   Work-up repeated with his CPAP machine working well.     Carbon monoxide 9.5% (high) on 05/07/2020.     Suggest carbon monoxide monitoring in apartment in [place].    EPO level 90.9 on 05/07/2020.    JAK, Exon 12-15, CALR, MPL were negative.  Continue to encourage smoking cessation.  Review interval abdomen and pelvis CT on 06/17/2020.  It  is personally reviewed.  Agree with radiology interpretation.   He has borderline mild splenomegaly (13 cm).  Check chest x-ray today. 3. Elevated erythropoietin level EPO level is slightly elevated in secondary polycythemia. EPO level can be elevated in response to hypoxia or an erythropoietin secreting tumor. The degree of elevation does not reveal the underlying cause.   Differential diagnosis includes HCC, renal cell carcinoma, hemangioblastoma, and pheochromocytoma.  Review abdomen and pelvis CT scan as above. 4.   Health maintenance  Last colonoscopy was 10/19/2017.   Next colonoscopy in 3 years per patient.  Ferritin (pending).  Patient denies any bleeding.   Low ferritin likely secondary to phlebotomies.  Disposition: Will likely need phlebotomy today as HGB was 18.4 on 04/13.  Will check CBC today.  RTC monthly x 6 for labs (HCT/Hgb) and +/- phlebotomy (new parameters). RTC in 2 month for MD assessment (CBC with diff, ferritin).

## 2021-05-04 DIAGNOSIS — G4733 Obstructive sleep apnea (adult) (pediatric): Secondary | ICD-10-CM | POA: Diagnosis not present

## 2021-05-06 ENCOUNTER — Inpatient Hospital Stay: Payer: BC Managed Care – PPO | Attending: Hematology and Oncology

## 2021-05-06 ENCOUNTER — Other Ambulatory Visit: Payer: Self-pay

## 2021-05-06 ENCOUNTER — Inpatient Hospital Stay: Payer: BC Managed Care – PPO

## 2021-05-06 VITALS — BP 130/71 | HR 48 | Temp 97.0°F | Resp 18

## 2021-05-06 DIAGNOSIS — D751 Secondary polycythemia: Secondary | ICD-10-CM

## 2021-05-06 DIAGNOSIS — Z79899 Other long term (current) drug therapy: Secondary | ICD-10-CM | POA: Insufficient documentation

## 2021-05-06 DIAGNOSIS — I4891 Unspecified atrial fibrillation: Secondary | ICD-10-CM | POA: Insufficient documentation

## 2021-05-06 DIAGNOSIS — G4730 Sleep apnea, unspecified: Secondary | ICD-10-CM | POA: Diagnosis not present

## 2021-05-06 DIAGNOSIS — E7800 Pure hypercholesterolemia, unspecified: Secondary | ICD-10-CM | POA: Diagnosis not present

## 2021-05-06 DIAGNOSIS — I10 Essential (primary) hypertension: Secondary | ICD-10-CM | POA: Insufficient documentation

## 2021-05-06 DIAGNOSIS — R718 Other abnormality of red blood cells: Secondary | ICD-10-CM

## 2021-05-06 LAB — FERRITIN: Ferritin: 9 ng/mL — ABNORMAL LOW (ref 24–336)

## 2021-05-06 LAB — CBC WITH DIFFERENTIAL/PLATELET
Abs Immature Granulocytes: 0.09 10*3/uL — ABNORMAL HIGH (ref 0.00–0.07)
Basophils Absolute: 0.1 10*3/uL (ref 0.0–0.1)
Basophils Relative: 1 %
Eosinophils Absolute: 0.2 10*3/uL (ref 0.0–0.5)
Eosinophils Relative: 2 %
HCT: 54.9 % — ABNORMAL HIGH (ref 39.0–52.0)
Hemoglobin: 17 g/dL (ref 13.0–17.0)
Immature Granulocytes: 1 %
Lymphocytes Relative: 16 %
Lymphs Abs: 1.6 10*3/uL (ref 0.7–4.0)
MCH: 26 pg (ref 26.0–34.0)
MCHC: 31 g/dL (ref 30.0–36.0)
MCV: 83.8 fL (ref 80.0–100.0)
Monocytes Absolute: 0.9 10*3/uL (ref 0.1–1.0)
Monocytes Relative: 9 %
Neutro Abs: 7.4 10*3/uL (ref 1.7–7.7)
Neutrophils Relative %: 71 %
Platelets: 129 10*3/uL — ABNORMAL LOW (ref 150–400)
RBC: 6.55 MIL/uL — ABNORMAL HIGH (ref 4.22–5.81)
RDW: 19.6 % — ABNORMAL HIGH (ref 11.5–15.5)
WBC: 10.2 10*3/uL (ref 4.0–10.5)
nRBC: 0 % (ref 0.0–0.2)

## 2021-05-06 NOTE — Progress Notes (Signed)
On 04/08/21 Ferritin 17.  Today's Hemoglobin 17 and Ferritin pending.  Per Orders 500 cc therapeutic phlebotomy to be performed if hemoglobin greater than 16.  Pt reports being asymptomatic except for intermittent headaches. Pt would like to proceed with phlebotomy as scheduled at this time. Per Faythe Casa NP proceed with 500 cc therapeutic phlebotomy as scheduled.   Pt tolerated procedure well, no s/s of distress noted. Pt and VS stable at discharge.

## 2021-05-06 NOTE — Patient Instructions (Signed)

## 2021-05-15 ENCOUNTER — Ambulatory Visit: Payer: BC Managed Care – PPO | Admitting: Internal Medicine

## 2021-05-22 ENCOUNTER — Inpatient Hospital Stay (HOSPITAL_BASED_OUTPATIENT_CLINIC_OR_DEPARTMENT_OTHER): Payer: BC Managed Care – PPO | Admitting: Internal Medicine

## 2021-05-22 ENCOUNTER — Encounter: Payer: Self-pay | Admitting: Internal Medicine

## 2021-05-22 ENCOUNTER — Other Ambulatory Visit: Payer: Self-pay

## 2021-05-22 DIAGNOSIS — G4730 Sleep apnea, unspecified: Secondary | ICD-10-CM | POA: Diagnosis not present

## 2021-05-22 DIAGNOSIS — E7800 Pure hypercholesterolemia, unspecified: Secondary | ICD-10-CM | POA: Diagnosis not present

## 2021-05-22 DIAGNOSIS — Z79899 Other long term (current) drug therapy: Secondary | ICD-10-CM | POA: Diagnosis not present

## 2021-05-22 DIAGNOSIS — D751 Secondary polycythemia: Secondary | ICD-10-CM

## 2021-05-22 DIAGNOSIS — I10 Essential (primary) hypertension: Secondary | ICD-10-CM | POA: Diagnosis not present

## 2021-05-22 DIAGNOSIS — I4891 Unspecified atrial fibrillation: Secondary | ICD-10-CM | POA: Diagnosis not present

## 2021-05-22 NOTE — Progress Notes (Signed)
[facility] NOTE  Patient Care Team: Paulene Floor as PCP - General (Physician Assistant) Kate Sable, MD as PCP - Cardiology (Cardiology) Paulene Floor as Physician Assistant (Physician Assistant) Bary Castilla, Forest Gleason, MD (General Surgery) Lequita Asal, MD as Referring Physician (Hematology and Oncology)  CHIEF COMPLAINTS/PURPOSE OF CONSULTATION: erythrocytosis Erythrocytosis [low iron levels; Dr. Ronny Bacon. [place]-JAK2 negative]; symptomatic ~hematocrit 20s; goal 62  #A. fib on Eliquis  #  Oncology History  Erythrocytosis     HISTORY OF PRESENTING ILLNESS:  Jason Bolton 65 y.o.  male with longstanding history of erythrocytosis of unclear etiology is here for follow-up.  Patient denies any strokes or any complications from erythrocytosis.  He does complain of headaches if hematocrit greater than 50s.  Patient has been getting monthly phlebotomies.  Review of Systems  Constitutional:  Negative for chills, diaphoresis, fever, malaise/fatigue and weight loss.  HENT:  Negative for nosebleeds and sore throat.   Eyes:  Negative for double vision.  Respiratory:  Negative for cough, hemoptysis, sputum production, shortness of breath and wheezing.   Cardiovascular:  Negative for chest pain, palpitations, orthopnea and leg swelling.  Gastrointestinal:  Negative for abdominal pain, blood in stool, constipation, diarrhea, heartburn, melena, nausea and vomiting.  Genitourinary:  Negative for dysuria, frequency and urgency.  Musculoskeletal:  Negative for back pain and joint pain.  Skin: Negative.  Negative for itching and rash.  Neurological:  Negative for dizziness, tingling, focal weakness, weakness and headaches.  Endo/Heme/Allergies:  Does not bruise/bleed easily.  Psychiatric/Behavioral:  Negative for depression. The patient is not nervous/anxious and does not have insomnia.     MEDICAL HISTORY:  Past Medical History:   Diagnosis Date   Allergy    Hypercholesterolemia    Hypertension    Personal history of osteomyelitis    of the jaw- 2005-mandibular surgery   Polycythemia    Sleep apnea     SURGICAL HISTORY: Past Surgical History:  Procedure Laterality Date   CARDIOVERSION N/A 03/04/2021   Procedure: CARDIOVERSION;  Surgeon: Kate Sable, MD;  Location: [facility] ORS;  Service: Cardiovascular;  Laterality: N/A;   CATARACT EXTRACTION Right 11/2013   COLONOSCOPY  2006   COLONOSCOPY WITH PROPOFOL N/A 10/19/2017   Procedure: COLONOSCOPY WITH PROPOFOL;  Surgeon: [name], MD;  Location: [facility] ENDOSCOPY;  Service: Endoscopy;  Laterality: N/A;   INGUINAL HERNIA REPAIR Right 1972   MANDIBLE SURGERY  2005   [facility]    SOCIAL HISTORY: Social History   Socioeconomic History   Marital status: Married    Spouse name: Not on file   Number of children: 2   Years of education: Not on file   Highest education level: Not on file  Occupational History   Occupation: supervisor  Tobacco Use   Smoking status: Former    Packs/day: 1.50    Years: 20.00    Pack years: 30.00    Types: Cigarettes    Quit date: 11/29/2015    Years since quitting: 5.4   Smokeless tobacco: Never   Tobacco comments:    quit June of 2008 after [employer]. he was exposed to secondary smoke  Vaping Use   Vaping Use: Never used  Substance and Sexual Activity   Alcohol use: Yes    Alcohol/week: 0.0 standard drinks    Comment: occasional   Drug use: No   Sexual activity: Not on file  Other Topics Concern   Not on file  Social History Narrative   Not on  file   Social Determinants of Health   Financial Resource Strain: Not on file  Food Insecurity: Not on file  Transportation Needs: Not on file  Physical Activity: Not on file  Stress: Not on file  Social Connections: Not on file  Intimate Partner Violence: Not on file    FAMILY HISTORY: Family History  Problem Relation Age of Onset   Diabetes Mother     Congestive Heart Failure Mother    Pancreatic cancer Father    Healthy Sister    Healthy Brother    Healthy Brother    Healthy Brother     ALLERGIES:  is allergic to penicillins.  MEDICATIONS:  Current Outpatient Medications  Medication Sig Dispense Refill   apixaban (ELIQUIS) 5 MG TABS tablet Take 1 tablet (5 mg total) by mouth 2 (two) times daily. 180 tablet 1   chlorthalidone (HYGROTON) 25 MG tablet Take 0.5 tablets (12.5 mg total) by mouth daily. 45 tablet 1   Coenzyme Q10 50 MG CAPS Take 50 mg by mouth every evening. CO ENZYME Q-10, 50MG  (Oral Capsule)  1 po qd for 0 days  Quantity: 30.00;  Refills: 0   Ordered :31-Aug-2010  Margarita Rana MD;  Started 31-Mar-2009 Active Comments: DX: 272.0     lisinopril (ZESTRIL) 20 MG tablet Take 1 tablet (20 mg total) by mouth daily. 90 tablet 1   lisinopril-hydrochlorothiazide (ZESTORETIC) 20-12.5 MG tablet Take 1 tablet by mouth daily.     meloxicam (MOBIC) 7.5 MG tablet TAKE 1 TABLET DAILY 90 tablet 1   metoprolol tartrate (LOPRESSOR) 100 MG tablet Take 0.5 tablets (50 mg total) by mouth 2 (two) times daily. 90 tablet 3   montelukast (SINGULAIR) 10 MG tablet Take one daily. 90 tablet 1   OMEGA-3 FATTY ACIDS PO Take 1 capsule by mouth every evening. FISH-EPA, 1000MG  (Oral Capsule)  2 po bid for 0 days  Quantity: 0.00;  Refills: 0   Ordered :31-Aug-2010  Margarita Rana MD;  Started 19-June-2007 Active Comments: DX: 272.0     sildenafil (VIAGRA) 50 MG tablet TAKE 1 TABLET BY MOUTH AS NEEDED FOR ERECTILE DYSFUNCTION GENERIC EQUIVALENT FOR VIAGRA 8 tablet 3   simvastatin (ZOCOR) 20 MG tablet Take 1 tablet (20 mg total) by mouth at bedtime. 90 tablet 1   tamsulosin (FLOMAX) 0.4 MG CAPS capsule TAKE 2 CAPSULES BY MOUTH DAILY GENERIC EQUIVALENT FOR FLOMAX 180 capsule 1   VENTOLIN HFA 108 (90 Base) MCG/ACT inhaler Inhale into the lungs.     Polyethyl Glycol-Propyl Glycol (LUBRICANT EYE DROPS) 0.4-0.3 % SOLN Place 1-2 drops into both eyes 3 (three) times  daily as needed (dry/irritated eyes).     No current facility-administered medications for this visit.      Marland Kitchen  PHYSICAL EXAMINATION: ECOG PERFORMANCE STATUS: 0 - Asymptomatic  Vitals:   05/22/21 1301  BP: 130/76  Pulse: (!) 56  Resp: 20  Temp: 97.8 F (36.6 C)  SpO2: 94%   Filed Weights   05/22/21 1301  Weight: 286 lb 6 oz (129.9 kg)    Physical Exam Vitals and nursing note reviewed.  Constitutional:      Comments: Ambulating: Independently; assistive devices  Accompanied: none; family  HENT:     Head: Normocephalic and atraumatic.     Mouth/Throat:     Pharynx: Oropharynx is clear.  Eyes:     Extraocular Movements: Extraocular movements intact.     Pupils: Pupils are equal, round, and reactive to light.  Cardiovascular:     Rate and  Rhythm: Normal rate and regular rhythm.  Pulmonary:     Comments: Decreased breath sounds bilaterally.  Abdominal:     Palpations: Abdomen is soft.  Musculoskeletal:        General: Normal range of motion.     Cervical back: Normal range of motion.  Skin:    General: Skin is warm.  Neurological:     General: No focal deficit present.     Mental Status: He is alert and oriented to person, place, and time.  Psychiatric:        Behavior: Behavior normal.        Judgment: Judgment normal.     LABORATORY DATA:  I have reviewed the data as listed Lab Results  Component Value Date   WBC 10.2 05/06/2021   HGB 17.0 05/06/2021   HCT 54.9 (H) 05/06/2021   MCV 83.8 05/06/2021   PLT 129 (L) 05/06/2021   Recent Labs    01/07/21 1059 01/14/21 0913 02/16/21 0755 02/23/21 0759  NA 138 142 137 138  K 4.1 4.4 4.1 4.2  CL 93* 95* 101 98  CO2 38* 27 30 31   GLUCOSE 102* 100* 121* 132*  BUN 18 13 18 17   CREATININE 0.96 0.83 0.81 0.90  CALCIUM 9.2 9.6 9.2 9.5  GFRNONAA >60 91 >60 >60  GFRAA  --  105  --   --   PROT 7.1 6.6  --   --   ALBUMIN 3.5 3.7*  --   --   AST 18 19  --   --   ALT 16 16  --   --   ALKPHOS 65 85  --    --   BILITOT 0.8 0.7  --   --     RADIOGRAPHIC STUDIES: I have personally reviewed the radiological images as listed and agreed with the findings in the report. No results found.  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 Symptomatic if hematocrit elevated ~50s.  Goal hematocrit less than 48.  Persistently low iron stores.  #Today hematocrit is 54; proceed with phlebotomy.  #History of A. Fib-on Eliquis.                # DISPOSITION:move ALL future appts to [place]; cancel [place] appts.  # phlebotomy on 6/28 in [place] # monthly H&H- possible phlebotomy # follow up in 4 months- MD; labs- cbc/cmp; LDH; possbile phlebotomy- dr.B  All questions were answered. The patient knows to call the clinic with any problems, questions or concerns.    Cammie Sickle, MD 05/23/2021 12:13 AM

## 2021-05-22 NOTE — Assessment & Plan Note (Addendum)
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 Symptomatic if hematocrit elevated ~50s.  Goal hematocrit less than 48.  Persistently low iron stores.  #Today hematocrit is 54; proceed with phlebotomy.  #History of A. Fib-on Eliquis.                # DISPOSITION:move ALL future appts to [place]; cancel [place] appts.  # phlebotomy on 6/28 in [place] # monthly H&H- possible phlebotomy # follow up in 4 months- MD; labs- cbc/cmp; LDH; possbile phlebotomy- dr.B

## 2021-05-23 ENCOUNTER — Encounter: Payer: Self-pay | Admitting: Oncology

## 2021-05-26 ENCOUNTER — Inpatient Hospital Stay: Payer: BC Managed Care – PPO

## 2021-05-26 VITALS — BP 138/79 | HR 55 | Temp 98.5°F | Resp 20

## 2021-05-26 DIAGNOSIS — I10 Essential (primary) hypertension: Secondary | ICD-10-CM | POA: Diagnosis not present

## 2021-05-26 DIAGNOSIS — I4891 Unspecified atrial fibrillation: Secondary | ICD-10-CM | POA: Diagnosis not present

## 2021-05-26 DIAGNOSIS — G4730 Sleep apnea, unspecified: Secondary | ICD-10-CM | POA: Diagnosis not present

## 2021-05-26 DIAGNOSIS — D751 Secondary polycythemia: Secondary | ICD-10-CM

## 2021-05-26 DIAGNOSIS — E7800 Pure hypercholesterolemia, unspecified: Secondary | ICD-10-CM | POA: Diagnosis not present

## 2021-05-26 DIAGNOSIS — Z79899 Other long term (current) drug therapy: Secondary | ICD-10-CM | POA: Diagnosis not present

## 2021-05-26 NOTE — Patient Instructions (Signed)
[facility] ONCOLOG[facility]  Discharge Instructions: Thank you for choosing [facility] to provide your oncology and hematology care.  If you have a lab appointment with the [facility], please go directly to the [facility] and check in at the registration area.  Wear comfortable clothing and clothing appropriate for easy access to any Portacath or PICC line.   We strive to give you quality time with your provider. You may need to reschedule your appointment if you arrive late (15 or more minutes).  Arriving late affects you and other patients whose appointments are after yours.  Also, if you miss three or more appointments without notifying the office, you may be dismissed from the clinic at the provider's discretion.      For prescription refill requests, have your pharmacy contact our office and allow 72 hours for refills to be completed.    Today you received the following: Therapeutic Phlebotomy   To help prevent nausea and vomiting after your treatment, we encourage you to take your nausea medication as directed.  BELOW ARE SYMPTOMS THAT SHOULD BE REPORTED IMMEDIATELY: *FEVER GREATER THAN 100.4 F (38 C) OR HIGHER *CHILLS OR SWEATING *NAUSEA AND VOMITING THAT IS NOT CONTROLLED WITH YOUR NAUSEA MEDICATION *UNUSUAL SHORTNESS OF BREATH *UNUSUAL BRUISING OR BLEEDING *URINARY PROBLEMS (pain or burning when urinating, or frequent urination) *BOWEL PROBLEMS (unusual diarrhea, constipation, pain near the anus) TENDERNESS IN MOUTH AND THROAT WITH OR WITHOUT PRESENCE OF ULCERS (sore throat, sores in mouth, or a toothache) UNUSUAL RASH, SWELLING OR PAIN  UNUSUAL VAGINAL DISCHARGE OR ITCHING   Items with * indicate a potential emergency and should be followed up as soon as possible or go to the Emergency Department if any problems should occur.  Please show the CHEMOTHERAPY ALERT CARD or IMMUNOTHERAPY ALERT CARD at check-in to the Emergency Department and  triage nurse.  Should you have questions after your visit or need to cancel or reschedule your appointment, please contact [facility]  [phone] and follow the prompts.  Office hours are 8:00 a.m. to 4:30 p.m. Monday - Friday. Please note that voicemails left after 4:00 p.m. may not be returned until the following business day.  We are closed weekends and major holidays. You have access to a nurse at all times for urgent questions. Please call the main number to the clinic [phone] and follow the prompts.  For any non-urgent questions, you may also contact your provider using MyChart. We now offer e-Visits for anyone 42 and older to request care online for non-urgent symptoms. For details visit [email or web address].   Also download the MyChart app! Go to the app store, search "MyChart", open the app, select [facility], and log in with your MyChart username and password.  Due to Covid, a mask is required upon entering the hospital/clinic. If you do not have a mask, one will be given to you upon arrival. For doctor visits, patients may have 1 support person aged 44 or older with them. For treatment visits, patients cannot have anyone with them due to current Covid guidelines and our immunocompromised population.

## 2021-06-10 ENCOUNTER — Inpatient Hospital Stay: Payer: BC Managed Care – PPO

## 2021-06-23 ENCOUNTER — Inpatient Hospital Stay: Payer: BC Managed Care – PPO

## 2021-06-23 ENCOUNTER — Inpatient Hospital Stay: Payer: BC Managed Care – PPO | Attending: Internal Medicine

## 2021-06-23 VITALS — BP 136/79 | HR 57 | Temp 98.0°F | Resp 18

## 2021-06-23 DIAGNOSIS — D751 Secondary polycythemia: Secondary | ICD-10-CM | POA: Diagnosis not present

## 2021-06-23 LAB — HEMATOCRIT: HCT: 51.5 % (ref 39.0–52.0)

## 2021-06-23 LAB — HEMOGLOBIN: Hemoglobin: 15.5 g/dL (ref 13.0–17.0)

## 2021-06-23 NOTE — Progress Notes (Signed)
Jason Bolton presents today for theraputic phlebotomy per MD orders. Last hgb/hct on  was .VSS prior to procedure. Pt reports eating before arrival. Procedure started at 1440 using patients 20G  right hand. 500 grams of blood removed. Procedure ended at 1452. Gauze and coban applied to [facility], site clean and dry. VSS upon completion of procedure. Pt denies dizziness, lightheadedness, or feeling faint. Observed  post procedure. Discharged in satisfactory condition with follow up instructions.

## 2021-06-23 NOTE — Patient Instructions (Signed)
[facility] ONCOLOGY  Discharge Instructions: Thank you for choosing [facility] to provide your oncology and hematology care.  If you have a lab appointment with the [facility], please go directly to the [facility] and check in at the registration area.  Wear comfortable clothing and clothing appropriate for easy access to any Portacath or PICC line.   We strive to give you quality time with your provider. You may need to reschedule your appointment if you arrive late (15 or more minutes).  Arriving late affects you and other patients whose appointments are after yours.  Also, if you miss three or more appointments without notifying the office, you may be dismissed from the clinic at the provider's discretion.      For prescription refill requests, have your pharmacy contact our office and allow 72 hours for refills to be completed.    Today you received the following chemotherapy and/or immunotherapy agents phlebotomy      To help prevent nausea and vomiting after your treatment, we encourage you to take your nausea medication as directed.  BELOW ARE SYMPTOMS THAT SHOULD BE REPORTED IMMEDIATELY: *FEVER GREATER THAN 100.4 F (38 C) OR HIGHER *CHILLS OR SWEATING *NAUSEA AND VOMITING THAT IS NOT CONTROLLED WITH YOUR NAUSEA MEDICATION *UNUSUAL SHORTNESS OF BREATH *UNUSUAL BRUISING OR BLEEDING *URINARY PROBLEMS (pain or burning when urinating, or frequent urination) *BOWEL PROBLEMS (unusual diarrhea, constipation, pain near the anus) TENDERNESS IN MOUTH AND THROAT WITH OR WITHOUT PRESENCE OF ULCERS (sore throat, sores in mouth, or a toothache) UNUSUAL RASH, SWELLING OR PAIN  UNUSUAL VAGINAL DISCHARGE OR ITCHING   Items with * indicate a potential emergency and should be followed up as soon as possible or go to the Emergency Department if any problems should occur.  Please show the CHEMOTHERAPY ALERT CARD or IMMUNOTHERAPY ALERT CARD at check-in  to the Emergency Department and triage nurse.  Should you have questions after your visit or need to cancel or reschedule your appointment, please contact [facility]  [phone] and follow the prompts.  Office hours are 8:00 a.m. to 4:30 p.m. Monday - Friday. Please note that voicemails left after 4:00 p.m. may not be returned until the following business day.  We are closed weekends and major holidays. You have access to a nurse at all times for urgent questions. Please call the main number to the clinic [phone] and follow the prompts.  For any non-urgent questions, you may also contact your provider using MyChart. We now offer e-Visits for anyone 1 and older to request care online for non-urgent symptoms. For details visit [email or web address].   Also download the MyChart app! Go to the app store, search "MyChart", open the app, select [facility], and log in with your MyChart username and password.  Due to Covid, a mask is required upon entering the hospital/clinic. If you do not have a mask, one will be given to you upon arrival. For doctor visits, patients may have 1 support person aged 43 or older with them. For treatment visits, patients cannot have anyone with them due to current Covid guidelines and our immunocompromised population.  Therapeutic Phlebotomy Discharge Instructions  - Increase your fluid intake over the next 4 hours  - No smoking for 30 minutes  - Avoid using the affected arm (the one you had the blood drawn from) for heavy lifting or other activities.  - You may resume all normal activities after 30 minutes.  You  are to notify the office if you experience:   - Persistent dizziness and/or lightheadedness -Uncontrolled or excessive bleeding at the site.

## 2021-07-08 ENCOUNTER — Inpatient Hospital Stay: Payer: BC Managed Care – PPO

## 2021-07-21 ENCOUNTER — Inpatient Hospital Stay: Payer: BC Managed Care – PPO

## 2021-07-21 ENCOUNTER — Inpatient Hospital Stay: Payer: BC Managed Care – PPO | Attending: Internal Medicine

## 2021-07-27 ENCOUNTER — Other Ambulatory Visit: Payer: Self-pay | Admitting: Physician Assistant

## 2021-07-27 DIAGNOSIS — N400 Enlarged prostate without lower urinary tract symptoms: Secondary | ICD-10-CM

## 2021-07-27 DIAGNOSIS — I10 Essential (primary) hypertension: Secondary | ICD-10-CM

## 2021-07-27 DIAGNOSIS — E7800 Pure hypercholesterolemia, unspecified: Secondary | ICD-10-CM

## 2021-07-27 MED ORDER — SIMVASTATIN 20 MG PO TABS: 20.0000 mg | ORAL_TABLET | Freq: Every day | ORAL | 1 refills | Status: DC

## 2021-07-27 MED ORDER — LISINOPRIL 20 MG PO TABS
20.0000 mg | ORAL_TABLET | Freq: Every day | ORAL | 1 refills | Status: DC
Start: 2021-07-27 — End: 2021-10-15

## 2021-07-27 MED ORDER — TAMSULOSIN HCL 0.4 MG PO CAPS: ORAL_CAPSULE | ORAL | 1 refills | Status: DC

## 2021-07-27 NOTE — Telephone Encounter (Signed)
Requested Prescriptions  Pending Prescriptions Disp Refills  . tamsulosin (FLOMAX) 0.4 MG CAPS capsule 180 capsule 1    Sig: TAKE 2 CAPSULES BY MOUTH DAILY GENERIC EQUIVALENT FOR [facility]     Urology: Alpha-Adrenergic Blocker Passed - 07/27/2021 11:31 AM      Passed - Last BP in normal range    BP Readings from Last 1 Encounters:  06/23/21 136/79         Passed - Valid encounter within last 12 months    Recent Outpatient Visits          6 months ago Annual physical exam   [facility] Trinna Post, PA-C   1 year ago Essential (primary) hypertension   [facility], Wendee Beavers, PA-C   1 year ago Essential (primary) hypertension   [place], Wendee Beavers, [place]   2 years ago Prediabetes   [facility] [place], Wendee Beavers, [place]   2 years ago Annual physical exam   [facility] Trinna Post, [place]      Future Appointments            In 2 weeks Chrism[place], Vickki Muff, PA-C [employer], [place]           . simvastatin (ZOCOR) 20 MG tablet 90 tablet 1    Sig: Take 1 tablet (20 mg total) by mouth at bedtime.     Cardiovascular:  Antilipid - Statins Failed - 07/27/2021 11:31 AM      Failed - HDL in normal range and within 360 days    HDL  Date Value Ref Range Status  01/14/2021 35 (L) >39 mg/dL Final         Passed - Total Cholesterol in normal range and within 360 days    Cholesterol, Total  Date Value Ref Range Status  01/14/2021 122 100 - 199 mg/dL Final         Passed - LDL in normal range and within 360 days    LDL Chol Calc (NIH)  Date Value Ref Range Status  01/14/2021 72 0 - 99 mg/dL Final         Passed - Triglycerides in normal range and within 360 days    Triglycerides  Date Value Ref Range Status  01/14/2021 73 0 - 149 mg/dL Final         Passed - Patient is not pregnant      Passed - Valid encounter within last 12 months    Recent Outpatient Visits           6 months ago Annual physical exam   [facility] [place], Wendee Beavers, PA-C   1 year ago Essential (primary) hypertension   [employer], [place], PA-C   1 year ago Essential (primary) hypertension   [employer], Wendee Beavers, [place]   2 years ago Prediabetes   [employer], [place], [place]   2 years ago Annual physical exam   [employer], Adriana M, [place]      Future Appointments            In 2 weeks Chrismon, Vickki Muff, PA-C [employer], [facility]           . metoprolol tartrate (LOPRESSOR) 100 MG tablet 90 tablet     Sig: Take 0.5 tablets (50 mg total) by mouth 2 (two) times daily.     Cardiovascular:  Beta Blockers Failed -  07/27/2021 11:31 AM      Failed - Valid encounter within last 6 months    Recent Outpatient Visits          6 months ago Annual physical exam   [facility] Trinna Post, [place]   1 year ago Essential (primary) hypertension   [place], Wendee Beavers, PA-C   1 year ago Essential (primary) hypertension   [place], Wendee Beavers, [place]   2 years ago Prediabetes   [place], Wendee Beavers, [place]   2 years ago Annual physical exam   [place], [place]      Future Appointments            In 2 weeks Chrismon, Vickki Muff, PA-C [employer], [facility]           Passed - Last BP in normal range    BP Readings from Last 1 Encounters:  06/23/21 136/79         Passed - Last Heart Rate in normal range    Pulse Readings from Last 1 Encounters:  06/23/21 (!) 57         . lisinopril (ZESTRIL) 20 MG tablet 90 tablet 1    Sig: Take 1 tablet (20 mg total) by mouth daily.     Cardiovascular:  ACE Inhibitors Failed - 07/27/2021 11:31 AM      Failed - Valid encounter within last 6 months    Recent Outpatient Visits          6 months ago  Annual physical exam   [facility] Carles Collet M, PA-C   1 year ago Essential (primary) hypertension   [place], [place], PA-C   1 year ago Essential (primary) hypertension   [employer], Wendee Beavers, [place]   2 years ago Prediabetes   [place], [place], [place]   2 years ago Annual physical exam   [facility] Trinna Post, [place]      Future Appointments            In 2 weeks Chrismon, Vickki Muff, PA-C [employer], [facility]           Passed - Cr in normal range and within 180 days    Creatinine  Date Value Ref Range Status  08/18/2012 0.83 0.60 - 1.30 mg/dL Final   Creatinine, Ser  Date Value Ref Range Status  02/23/2021 0.90 0.61 - 1.24 mg/dL Final         Passed - K in normal range and within 180 days    Potassium  Date Value Ref Range Status  02/23/2021 4.2 3.5 - 5.1 mmol/L Final  08/18/2012 4.3 3.5 - 5.1 mmol/L Final         Passed - Patient is not pregnant      Passed - Last BP in normal range    BP Readings from Last 1 Encounters:  06/23/21 136/79

## 2021-07-27 NOTE — Telephone Encounter (Signed)
Prescriber not at this practice.

## 2021-07-27 NOTE — Telephone Encounter (Signed)
Medication Refill - Medication: metoprolol tartrate (LOPRESSOR) 100 MG tablet  lisinopril (ZESTRIL) 20 MG tablet    tamsulosin (FLOMAX) 0.4 MG CAPS capsule  simvastatin (ZOCOR) 20 MG tablet  Has the patient contacted their pharmacy? Yes.   (Agent: If no, request that the patient contact the pharmacy for the refill.) (Agent: If yes, when and what did the pharmacy advise?)  Preferred Pharmacy (with phone number or street name): [facility] [place]  [address] [place] [address]  Phone:  [phone]  Fax:  [phone]   Agent: Please be advised that RX refills may take up to 3 business days. We ask that you follow-up with your pharmacy.

## 2021-07-28 ENCOUNTER — Telehealth: Payer: Self-pay | Admitting: Family Medicine

## 2021-07-28 NOTE — Telephone Encounter (Signed)
[facility] called and spoke to [place], [facility] about the below message. She says that was the UR department and they are closed at the moment, so they will need to be called back tomorrow to speak to someone about the medications. CB # [id number].

## 2021-07-28 NOTE — Telephone Encounter (Signed)
Copied from [facility] [id number]. Topic: Quick Communication - Rx Refill/Question >> Jul 28, 2021  9:45 AM Robina Ade, Helene Kelp D wrote: Medication: lisinopril (ZESTRIL) 20 MG tablet lisinopril-hydrochlorothiazide (ZESTORETIC) 20-12.5 MG tablet  Has the patient contacted their pharmacy? Yes.  John with [facility] called and said that he got both of these medication refill request and wanted to know if patient gets both or which one is correct for refill. Please return his call to [phone] and use invoice # [id number] (Agent: If no, request that the patient contact the pharmacy for the refill.) (Agent: If yes, when and what did the pharmacy advise?)  Preferred Pharmacy (with phone number or street name): [facility], [place]  Agent: Please be advised that RX refills may take up to 3 business days. We ask that you follow-up with your pharmacy.

## 2021-07-29 NOTE — Telephone Encounter (Signed)
Caller name: Elmyra Ricks  Relation to pt: from [facility]  Call back number:  [phone] reference # [id number]    Reason for call:  Caller would like a follow up regarding the below, please advise

## 2021-07-30 NOTE — Telephone Encounter (Signed)
He may have to contact his cardiologist to be sure if he is to take both the Zestril and the Zestoretic. Last note from Dr. Leanne Chang (cardiologist) indicated he may have him on Lisinopril and Spironolactone for hypertension.

## 2021-08-04 NOTE — Telephone Encounter (Signed)
Patient advised of message and states he is getting all those medications from his cardiologist, we should not be refilling any of them

## 2021-08-13 ENCOUNTER — Ambulatory Visit: Payer: BC Managed Care – PPO | Admitting: Family Medicine

## 2021-08-18 ENCOUNTER — Inpatient Hospital Stay: Payer: BC Managed Care – PPO

## 2021-08-18 ENCOUNTER — Inpatient Hospital Stay: Payer: BC Managed Care – PPO | Attending: Internal Medicine

## 2021-08-18 DIAGNOSIS — D751 Secondary polycythemia: Secondary | ICD-10-CM | POA: Diagnosis not present

## 2021-08-18 LAB — HEMOGLOBIN: Hemoglobin: 15.1 g/dL (ref 13.0–17.0)

## 2021-08-18 LAB — HEMATOCRIT: HCT: 51.6 % (ref 39.0–52.0)

## 2021-09-10 ENCOUNTER — Other Ambulatory Visit: Payer: Self-pay

## 2021-09-10 ENCOUNTER — Ambulatory Visit: Payer: BC Managed Care – PPO | Admitting: Family Medicine

## 2021-09-10 ENCOUNTER — Encounter: Payer: Self-pay | Admitting: Family Medicine

## 2021-09-10 VITALS — BP 129/71 | HR 66 | Temp 97.5°F | Wt 287.3 lb

## 2021-09-10 DIAGNOSIS — I480 Paroxysmal atrial fibrillation: Secondary | ICD-10-CM | POA: Diagnosis not present

## 2021-09-10 DIAGNOSIS — E669 Obesity, unspecified: Secondary | ICD-10-CM | POA: Insufficient documentation

## 2021-09-10 DIAGNOSIS — Z23 Encounter for immunization: Secondary | ICD-10-CM

## 2021-09-10 DIAGNOSIS — E7800 Pure hypercholesterolemia, unspecified: Secondary | ICD-10-CM | POA: Diagnosis not present

## 2021-09-10 DIAGNOSIS — R7303 Prediabetes: Secondary | ICD-10-CM

## 2021-09-10 DIAGNOSIS — N400 Enlarged prostate without lower urinary tract symptoms: Secondary | ICD-10-CM

## 2021-09-10 DIAGNOSIS — I10 Essential (primary) hypertension: Secondary | ICD-10-CM

## 2021-09-10 LAB — POCT GLYCOSYLATED HEMOGLOBIN (HGB A1C): Hemoglobin A1C: 5.9 % — AB (ref 4.0–5.6)

## 2021-09-10 MED ORDER — TAMSULOSIN HCL 0.4 MG PO CAPS: ORAL_CAPSULE | ORAL | 3 refills | Status: AC

## 2021-09-10 MED ORDER — SIMVASTATIN 20 MG PO TABS: 20.0000 mg | ORAL_TABLET | Freq: Every day | ORAL | 3 refills | Status: AC

## 2021-09-10 NOTE — Assessment & Plan Note (Signed)
Discussed importance of diet and exercise A1c stable

## 2021-09-10 NOTE — Assessment & Plan Note (Signed)
Chronic, Stable Denies CP, SOB, DOE, LE Edema

## 2021-09-10 NOTE — Assessment & Plan Note (Signed)
Pulled for medication refill 

## 2021-09-10 NOTE — Assessment & Plan Note (Signed)
-  htn -hld -Afib Discussed importance of healthy weight management Discussed diet and exercise

## 2021-09-10 NOTE — Assessment & Plan Note (Signed)
BMI 37.90

## 2021-09-10 NOTE — Progress Notes (Signed)
Established patient visit   Patient: Jason Bolton   DOB: 1956/08/06   65 y.o. Male  MRN: [id number] Visit Date: 09/10/2021  Today's healthcare provider: Gwyneth Sprout, FNP   Chief Complaint  Patient presents with   Prediabetes   Hypertension   Hyperlipidemia   Subjective    HPI  Prediabetes, Follow-up  Lab Results  Component Value Date   HGBA1C 5.9 (A) 09/10/2021   HGBA1C 5.7 (A) 01/14/2021   HGBA1C 5.7 (A) 07/15/2020   GLUCOSE 132 (H) 02/23/2021   GLUCOSE 121 (H) 02/16/2021   GLUCOSE 100 (H) 01/14/2021    Last seen for for this 8 months ago.  Management since that visit includes none. Current symptoms include none and have been improving.  Prior visit with dietician: no Current diet: well balanced Current exercise: none  Pertinent Labs:    Component Value Date/Time   CHOL 122 01/14/2021 0913   TRIG 73 01/14/2021 0913   CHOLHDL 3.5 01/14/2021 0913   CREATININE 0.90 02/23/2021 0759   CREATININE 0.83 08/18/2012 1343    Wt Readings from Last 3 Encounters:  09/10/21 287 lb 4.8 oz (130.3 kg)  05/22/21 286 lb 6 oz (129.9 kg)  03/16/21 278 lb (126.1 kg)    -----------------------------------------------------------------------------------------  Lipid/Cholesterol, Follow-up  Last lipid panel Other pertinent labs  Lab Results  Component Value Date   CHOL 122 01/14/2021   HDL 35 (L) 01/14/2021   LDLCALC 72 01/14/2021   TRIG 73 01/14/2021   CHOLHDL 3.5 01/14/2021   Lab Results  Component Value Date   ALT 16 01/14/2021   AST 19 01/14/2021   PLT 129 (L) 05/06/2021   TSH 1.880 01/14/2021     He was last seen for this 8 months ago.  Management since that visit includes none.  He reports   compliance with treatment. He is not having side effects.   Symptoms: No chest pain No chest pressure/discomfort  No dyspnea No lower extremity edema  No numbness or tingling of extremity No orthopnea  No palpitations No paroxysmal nocturnal dyspnea   No speech difficulty No syncope   Current diet: well balanced Current exercise: none  The ASCVD Risk score (Arnett DK, et al., 2019) failed to calculate for the following reasons:   The valid total cholesterol range is 130 to 320 mg/dL  ---------------------------------------------------------------------------------------------------  Hypertension, follow-up  BP Readings from Last 3 Encounters:  09/10/21 129/71  06/23/21 136/79  05/26/21 138/79   Wt Readings from Last 3 Encounters:  09/10/21 287 lb 4.8 oz (130.3 kg)  05/22/21 286 lb 6 oz (129.9 kg)  03/16/21 278 lb (126.1 kg)     He was last seen for hypertension  8 months ago.  BP at that visit was 156/102. Management since that visit includes none.  He reports excellent compliance with treatment. He is not having side effects.  He is following a Regular diet. He is not exercising. He does not smoke.  Use of agents associated with hypertension: none.   Outside blood pressures are systolic range 616-073 diastolic 71-06. Symptoms: No chest pain No chest pressure  No palpitations No syncope  No dyspnea No orthopnea  No paroxysmal nocturnal dyspnea NO lower extremity edema   Pertinent labs: Lab Results  Component Value Date   CHOL 122 01/14/2021   HDL 35 (L) 01/14/2021   LDLCALC 72 01/14/2021   TRIG 73 01/14/2021   CHOLHDL 3.5 01/14/2021   Lab Results  Component Value Date   NA  138 02/23/2021   K 4.2 02/23/2021   CREATININE 0.90 02/23/2021   GFRNONAA >60 02/23/2021   GLUCOSE 132 (H) 02/23/2021     The ASCVD Risk score (Arnett DK, et al., 2019) failed to calculate for the following reasons:   The valid total cholesterol range is 130 to 320 mg/dL   ---------------------------------------------------------------------------------------------------    Medications: Outpatient Medications Prior to Visit  Medication Sig   apixaban (ELIQUIS) 5 MG TABS tablet Take 1 tablet (5 mg total) by mouth 2 (two) times  daily.   chlorthalidone (HYGROTON) 25 MG tablet Take 0.5 tablets (12.5 mg total) by mouth daily.   Coenzyme Q10 50 MG CAPS Take 50 mg by mouth every evening. CO ENZYME Q-10, 50MG  (Oral Capsule)  1 po qd for 0 days  Quantity: 30.00;  Refills: 0   Ordered :31-Aug-2010  Margarita Rana MD;  Started 31-Mar-2009 Active Comments: DX: 272.0   lisinopril (ZESTRIL) 20 MG tablet Take 1 tablet (20 mg total) by mouth daily.   lisinopril-hydrochlorothiazide (ZESTORETIC) 20-12.5 MG tablet Take 1 tablet by mouth daily.   meloxicam (MOBIC) 7.5 MG tablet TAKE 1 TABLET DAILY   metoprolol tartrate (LOPRESSOR) 100 MG tablet Take 0.5 tablets (50 mg total) by mouth 2 (two) times daily.   montelukast (SINGULAIR) 10 MG tablet Take one daily.   OMEGA-3 FATTY ACIDS PO Take 1 capsule by mouth every evening. FISH-EPA, 1000MG  (Oral Capsule)  2 po bid for 0 days  Quantity: 0.00;  Refills: 0   Ordered :31-Aug-2010  Margarita Rana MD;  Started 19-June-2007 Active Comments: DX: 272.0   Polyethyl Glycol-Propyl Glycol (LUBRICANT EYE DROPS) 0.4-0.3 % SOLN Place 1-2 drops into both eyes 3 (three) times daily as needed (dry/irritated eyes).   sildenafil (VIAGRA) 50 MG tablet TAKE 1 TABLET BY MOUTH AS NEEDED FOR ERECTILE DYSFUNCTION GENERIC EQUIVALENT FOR VIAGRA   VENTOLIN HFA 108 (90 Base) MCG/ACT inhaler Inhale into the lungs.   [DISCONTINUED] simvastatin (ZOCOR) 20 MG tablet Take 1 tablet (20 mg total) by mouth at bedtime.   [DISCONTINUED] tamsulosin (FLOMAX) 0.4 MG CAPS capsule TAKE 2 CAPSULES BY MOUTH DAILY GENERIC EQUIVALENT FOR FLOMAX   No facility-administered medications prior to visit.    Review of Systems     Objective    BP 129/71   Pulse 66   Temp (!) 97.5 F (36.4 C)   Wt 287 lb 4.8 oz (130.3 kg)   SpO2 94%   BMI 37.90 kg/m    Physical Exam Vitals and nursing note reviewed.  Constitutional:      Appearance: Normal appearance. He is obese.  HENT:     Head: Normocephalic and atraumatic.  Eyes:     Pupils:  Pupils are equal, round, and reactive to light.  Cardiovascular:     Rate and Rhythm: Normal rate and regular rhythm.     Pulses: Normal pulses.     Heart sounds: Normal heart sounds.     Comments: RC Afib Pulmonary:     Effort: Pulmonary effort is normal.     Breath sounds: Normal breath sounds.  Abdominal:     Palpations: Abdomen is soft.     Comments: Adiposity   Musculoskeletal:        General: Normal range of motion.     Cervical back: Normal range of motion.  Skin:    General: Skin is warm and dry.     Capillary Refill: Capillary refill takes less than 2 seconds.  Neurological:     General: No focal deficit  present.     Mental Status: He is alert and oriented to person, place, and time. Mental status is at baseline.  Psychiatric:        Mood and Affect: Mood normal.        Behavior: Behavior normal.        Thought Content: Thought content normal.        Judgment: Judgment normal.     Results for orders placed or performed in visit on 09/10/21  POCT glycosylated hemoglobin (Hb A1C)  Result Value Ref Range   Hemoglobin A1C 5.9 (A) 4.0 - 5.6 %   HbA1c POC (<> result, manual entry)     HbA1c, POC (prediabetic range)     HbA1c, POC (controlled diabetic range)      Assessment & Plan     Problem List Items Addressed This Visit       Cardiovascular and Mediastinum   Essential hypertension    Chronic, Stable Denies CP, SOB, DOE, LE Edema      Relevant Medications   simvastatin (ZOCOR) 20 MG tablet   Atrial fibrillation (HCC)    RC on Eliquis Managed by cards  S/p cardioversion 02/2021 x1      Relevant Medications   simvastatin (ZOCOR) 20 MG tablet     Genitourinary   Benign prostatic hyperplasia    Pulled for medication refill      Relevant Medications   tamsulosin (FLOMAX) 0.4 MG CAPS capsule     Other   Hypercholesterolemia without hypertriglyceridemia    Pulled for medication refill      Relevant Medications   simvastatin (ZOCOR) 20 MG tablet    Prediabetes - Primary    Discussed importance of diet and exercise A1c stable      Relevant Orders   POCT glycosylated hemoglobin (Hb A1C) (Completed)   Morbid obesity ([place])    -htn -hld -Afib Discussed importance of healthy weight management Discussed diet and exercise       Obesity (BMI 35.0-39.9 without comorbidity)    BMI 37.90        Return in about 6 months (around 03/11/2022) for annual examination, chonic disease management.      Vonna Kotyk, FNP, have reviewed all documentation for this visit. The documentation on 09/10/21 for the exam, diagnosis, procedures, and orders are all accurate and complete.    Gwyneth Sprout, [place] [phone] (phone) [phone] (fax)  [place]

## 2021-09-10 NOTE — Assessment & Plan Note (Signed)
RC on Eliquis Managed by cards  S/p cardioversion 02/2021 x1

## 2021-09-10 NOTE — Addendum Note (Signed)
Addended by: Minette Headland on: 09/10/2021 10:05 AM   Modules accepted: Orders

## 2021-09-16 ENCOUNTER — Inpatient Hospital Stay: Payer: BC Managed Care – PPO

## 2021-09-16 ENCOUNTER — Inpatient Hospital Stay: Payer: BC Managed Care – PPO | Admitting: Internal Medicine

## 2021-10-15 ENCOUNTER — Encounter: Payer: Self-pay | Admitting: Cardiology

## 2021-10-15 ENCOUNTER — Other Ambulatory Visit: Payer: Self-pay

## 2021-10-15 ENCOUNTER — Ambulatory Visit: Payer: BC Managed Care – PPO | Admitting: Cardiology

## 2021-10-15 VITALS — BP 122/84 | HR 74 | Ht 73.0 in | Wt 282.0 lb

## 2021-10-15 DIAGNOSIS — E7800 Pure hypercholesterolemia, unspecified: Secondary | ICD-10-CM

## 2021-10-15 DIAGNOSIS — I4891 Unspecified atrial fibrillation: Secondary | ICD-10-CM

## 2021-10-15 DIAGNOSIS — Z6837 Body mass index (BMI) 37.0-37.9, adult: Secondary | ICD-10-CM

## 2021-10-15 DIAGNOSIS — I10 Essential (primary) hypertension: Secondary | ICD-10-CM | POA: Diagnosis not present

## 2021-10-15 MED ORDER — APIXABAN 5 MG PO TABS: 5.0000 mg | ORAL_TABLET | Freq: Two times a day (BID) | ORAL | 1 refills | Status: DC

## 2021-10-15 MED ORDER — LISINOPRIL-HYDROCHLOROTHIAZIDE 20-12.5 MG PO TABS: 1.0000 | ORAL_TABLET | Freq: Two times a day (BID) | ORAL | 1 refills | Status: DC

## 2021-10-15 MED ORDER — METOPROLOL TARTRATE 50 MG PO TABS: 50.0000 mg | ORAL_TABLET | Freq: Two times a day (BID) | ORAL | 3 refills | Status: DC

## 2021-10-15 NOTE — Progress Notes (Signed)
Cardiology Office Note:    Date:  10/15/2021   ID:  Jason Bolton, DOB Aug 05, 1956, MRN [id number]  PCP:  Gwyneth Sprout, [place]  Cardiologist:  Kate Sable, MD  Advanced Practice Provider:  No care team member to display Electrophysiologist:  None       Referring MD: Gwyneth Sprout, FNP   Chief Complaint  Patient presents with   Other    6 month follow up. Meds reviewed verbally with patient.     History of Present Illness:    Jason Bolton is a 65 y.o. male with a hx of paroxysmal atrial fibrillation (DCCV 02/2021), hypertension, hyperlipidemia, sleep apnea on CPAP, former smoker x40+ years who presents for follow-up.    Patient is being seen for atrial fibrillation, DC cardioversion performed on 03/04/2021 successfully. He denies any palpitations. Feels well, takes medications as prescribed. Has no bleeding issues with eliquis. States being compliant with use of CPAP.   Prior notes Echocardiogram 02/05/2021 showed mildly reduced ejection fraction, EF 45 to 50%.  Mildly dilated LA, severely dilated RA. DC cardioversion 02/2021 successfully.  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   Polycythemia    Sleep apnea     Past Surgical History:  Procedure Laterality Date   CARDIOVERSION N/A 03/04/2021   Procedure: CARDIOVERSION;  Surgeon: Kate Sable, MD;  Location: [facility] ORS;  Service: Cardiovascular;  Laterality: N/A;   CATARACT EXTRACTION Right 11/2013   COLONOSCOPY  2006   COLONOSCOPY WITH PROPOFOL N/A 10/19/2017   Procedure: COLONOSCOPY WITH PROPOFOL;  Surgeon: [name], MD;  Location: [facility] ENDOSCOPY;  Service: Endoscopy;  Laterality: N/A;   INGUINAL HERNIA REPAIR Right 1972   MANDIBLE SURGERY  2005   [facility]    Current Medications: Current Meds  Medication Sig   Coenzyme Q10 50 MG CAPS  Take 50 mg by mouth every evening. CO ENZYME Q-10, 50MG  (Oral Capsule)  1 po qd for 0 days  Quantity: 30.00;  Refills: 0   Ordered :31-Aug-2010  Margarita Rana MD;  Started 31-Mar-2009 Active Comments: DX: 272.0   meloxicam (MOBIC) 7.5 MG tablet TAKE 1 TABLET DAILY   montelukast (SINGULAIR) 10 MG tablet Take one daily.   OMEGA-3 FATTY ACIDS PO Take 1 capsule by mouth every evening. FISH-EPA, 1000MG  (Oral Capsule)  2 po bid for 0 days  Quantity: 0.00;  Refills: 0   Ordered :31-Aug-2010  Margarita Rana MD;  Started 19-June-2007 Active Comments: DX: 272.0   Polyethyl Glycol-Propyl Glycol (LUBRICANT EYE DROPS) 0.4-0.3 % SOLN Place 1-2 drops into both eyes 3 (three) times daily as needed (dry/irritated eyes).   sildenafil (VIAGRA) 50 MG tablet TAKE 1 TABLET BY MOUTH AS NEEDED FOR ERECTILE DYSFUNCTION GENERIC EQUIVALENT FOR VIAGRA   simvastatin (ZOCOR) 20 MG tablet Take 1 tablet (20 mg total) by mouth at bedtime.   tamsulosin (FLOMAX) 0.4 MG CAPS capsule TAKE 2 CAPSULES BY MOUTH DAILY GENERIC EQUIVALENT FOR FLOMAX   VENTOLIN HFA 108 (90 Base) MCG/ACT inhaler Inhale into the lungs.   [DISCONTINUED] apixaban (ELIQUIS) 5 MG TABS tablet Take 1 tablet (5 mg total) by mouth 2 (two) times daily.   [DISCONTINUED] chlorthalidone (HYGROTON) 25 MG tablet Take 0.5 tablets (12.5 mg total) by mouth daily.   [DISCONTINUED] lisinopril (ZESTRIL) 20 MG tablet Take 1 tablet (20 mg total) by  mouth daily.   [DISCONTINUED] lisinopril-hydrochlorothiazide (ZESTORETIC) 20-12.5 MG tablet Take 1 tablet by mouth daily.   [DISCONTINUED] metoprolol tartrate (LOPRESSOR) 50 MG tablet Take 50 mg by mouth 2 (two) times daily.     Allergies:   Penicillins   Social History   Socioeconomic History   Marital status: Married    Spouse name: Not on file   Number of children: 2   Years of education: Not on file   Highest education level: Not on file  Occupational History   Occupation: supervisor  Tobacco Use   Smoking status: Former     Packs/day: 1.50    Years: 20.00    Pack years: 30.00    Types: Cigarettes    Quit date: 11/29/2015    Years since quitting: 5.8   Smokeless tobacco: Never   Tobacco comments:    quit June of 2008 after [employer]. he was exposed to secondary smoke  Vaping Use   Vaping Use: Never used  Substance and Sexual Activity   Alcohol use: Yes    Alcohol/week: 0.0 standard drinks    Comment: occasional   Drug use: No   Sexual activity: Not on file  Other Topics Concern   Not on file  Social History Narrative   Not on file   Social Determinants of Health   Financial Resource Strain: Not on file  Food Insecurity: Not on file  Transportation Needs: Not on file  Physical Activity: Not on file  Stress: Not on file  Social Connections: Not on file     Family History: The patient's family history includes Congestive Heart Failure in his mother; Diabetes in his mother; Healthy in his brother, brother, brother, and sister; Pancreatic cancer in his father.  ROS:   Please see the history of present illness.     All other systems reviewed and are negative.  EKGs/Labs/Other Studies Reviewed:    The following studies were reviewed today:   EKG:  EKG is  ordered today.  The ekg ordered today demonstrates atrial fibrillation , HR 74  Recent Labs: 01/14/2021: ALT 16; TSH 1.880 02/16/2021: B Natriuretic Peptide 256.3; Magnesium 1.8 02/23/2021: BUN 17; Creatinine, Ser 0.90; Potassium 4.2; Sodium 138 05/06/2021: Platelets 129 08/18/2021: Hemoglobin 15.1  Recent Lipid Panel    Component Value Date/Time   CHOL 122 01/14/2021 0913   TRIG 73 01/14/2021 0913   HDL 35 (L) 01/14/2021 0913   CHOLHDL 3.5 01/14/2021 0913   LDLCALC 72 01/14/2021 0913     Risk Assessment/Calculations:      Physical Exam:    VS:  BP 122/84 (BP Location: Left Arm, Patient Position: Sitting, Cuff Size: Large)   Pulse 74   Ht 6\' 1"  (1.854 m)   Wt 282 lb (127.9 kg)   SpO2 96%   BMI 37.21 kg/m     Wt Readings from  Last 3 Encounters:  10/15/21 282 lb (127.9 kg)  09/10/21 287 lb 4.8 oz (130.3 kg)  05/22/21 286 lb 6 oz (129.9 kg)     GEN:  Well nourished, well developed in no acute distress HEENT: Normal NECK: No JVD; No carotid bruits LYMPHATICS: No lymphadenopathy CARDIAC: Irregular irregular, no murmurs RESPIRATORY:  Clear to auscultation without rales, wheezing or rhonchi  ABDOMEN: Soft, non-tender, non-distended MUSCULOSKELETAL:  No edema; No deformity  SKIN: Warm and dry NEUROLOGIC:  Alert and oriented x 3 PSYCHIATRIC:  Normal affect   ASSESSMENT:    1. Atrial fibrillation, unspecified type ([place])   2. Primary hypertension  3. Pure hypercholesterolemia   4. BMI 37.0-37.9, adult    PLAN:    In order of problems listed above:  Persistent atrial fibrillation s/p DCCV 02/2021, CHA2DS2-VASc score 2 (htn, age).  Back in afib. Heart rate controlled. Patient is asymptomatic.  Continue Eliquis, Lopressor 50 mg twice daily.  OSA likely contributing to failed DCCV. Echo with mildly reduced EF, likely from A. fib.  Refer to EP for additional input Hypertension, BP controlled. Consolidate bp meds. startZestoretic 20-12.5mg  bid. Stop chlothalidone. Hyperlipidemia, continue simvastatin Obesity, low calorie diet, weight loss advised.  Follow-up in 6 months.    Medication Adjustments/Labs and Tests Ordered: Current medicines are reviewed at length with the patient today.  Concerns regarding medicines are outlined above.  Orders Placed This Encounter  Procedures   Ambulatory referral to Cardiac Electrophysiology   EKG 12-Lead    Meds ordered this encounter  Medications   metoprolol tartrate (LOPRESSOR) 50 MG tablet    Sig: Take 1 tablet (50 mg total) by mouth 2 (two) times daily.    Dispense:  180 tablet    Refill:  3   lisinopril-hydrochlorothiazide (ZESTORETIC) 20-12.5 MG tablet    Sig: Take 1 tablet by mouth 2 (two) times daily.    Dispense:  180 tablet    Refill:  1   apixaban  (ELIQUIS) 5 MG TABS tablet    Sig: Take 1 tablet (5 mg total) by mouth 2 (two) times daily.    Dispense:  180 tablet    Refill:  1     Patient Instructions  Medication Instructions:   Your physician has recommended you make the following change in your medication:    STOP taking your Chlorthalidone.  2.    STOP taking your Lisinopril  3.    INCREASE your Zestoretic to 20-12.5 MG TWICE a day.   *If you need a refill on your cardiac medications before your next appointment, please call your pharmacy*   Lab Work: None ordered If you have labs (blood work) drawn today and your tests are completely normal, you will receive your results only by: [place] (if you have MyChart) OR A paper copy in the mail If you have any lab test that is abnormal or we need to change your treatment, we will call you to review the results.   Testing/Procedures: None Ordered   Follow-Up: At [facility], you and your health needs are our priority.  As part of our continuing mission to provide you with exceptional heart care, we have created designated Provider Care Teams.  These Care Teams include your primary Cardiologist (physician) and Advanced Practice Providers (APPs -  Physician Assistants and Nurse Practitioners) who all work together to provide you with the care you need, when you need it.  We recommend signing up for the patient portal called "MyChart".  Sign up information is provided on this After Visit Summary.  MyChart is used to connect with patients for Virtual Visits (Telemedicine).  Patients are able to view lab/test results, encounter notes, upcoming appointments, etc.  Non-urgent messages can be sent to your provider as well.   To learn more about what you can do with MyChart, go to [email or web address].    Your next appointment:   6 month(s)  The format for your next appointment:   In Person  Provider:   You may see Kate Sable, MD or one of the following  Advanced Practice Providers on your designated Care Team:   Murray Hodgkins, NP Christell Faith, PA-C  Other Instructions    Signed, Kate Sable, MD  10/15/2021 11:39 AM    [place]

## 2021-10-15 NOTE — Patient Instructions (Signed)
Medication Instructions:   Your physician has recommended you make the following change in your medication:    STOP taking your Chlorthalidone.  2.    STOP taking your Lisinopril  3.    INCREASE your Zestoretic to 20-12.5 MG TWICE a day.   *If you need a refill on your cardiac medications before your next appointment, please call your pharmacy*   Lab Work: None ordered If you have labs (blood work) drawn today and your tests are completely normal, you will receive your results only by: [place] (if you have MyChart) OR A paper copy in the mail If you have any lab test that is abnormal or we need to change your treatment, we will call you to review the results.   Testing/Procedures: None Ordered   Follow-Up: At [facility], you and your health needs are our priority.  As part of our continuing mission to provide you with exceptional heart care, we have created designated Provider Care Teams.  These Care Teams include your primary Cardiologist (physician) and Advanced Practice Providers (APPs -  Physician Assistants and Nurse Practitioners) who all work together to provide you with the care you need, when you need it.  We recommend signing up for the patient portal called "MyChart".  Sign up information is provided on this After Visit Summary.  MyChart is used to connect with patients for Virtual Visits (Telemedicine).  Patients are able to view lab/test results, encounter notes, upcoming appointments, etc.  Non-urgent messages can be sent to your provider as well.   To learn more about what you can do with MyChart, go to [email or web address].    Your next appointment:   6 month(s)  The format for your next appointment:   In Person  Provider:   You may see Kate Sable, MD or one of the following Advanced Practice Providers on your designated Care Team:   Murray Hodgkins, NP Christell Faith, PA-C    Other Instructions

## 2021-10-29 HISTORY — PX: PELVIC FRACTURE SURGERY: SHX119

## 2021-10-30 DIAGNOSIS — M79605 Pain in left leg: Secondary | ICD-10-CM | POA: Diagnosis not present

## 2021-10-30 DIAGNOSIS — S32435A Nondisplaced fracture of anterior column [iliopubic] of left acetabulum, initial encounter for closed fracture: Secondary | ICD-10-CM | POA: Diagnosis not present

## 2021-10-30 DIAGNOSIS — R29898 Other symptoms and signs involving the musculoskeletal system: Secondary | ICD-10-CM | POA: Diagnosis not present

## 2021-10-30 DIAGNOSIS — S0990XA Unspecified injury of head, initial encounter: Secondary | ICD-10-CM | POA: Diagnosis not present

## 2021-10-30 DIAGNOSIS — S32402A Unspecified fracture of left acetabulum, initial encounter for closed fracture: Secondary | ICD-10-CM | POA: Diagnosis not present

## 2021-10-30 DIAGNOSIS — S3992XA Unspecified injury of lower back, initial encounter: Secondary | ICD-10-CM | POA: Diagnosis not present

## 2021-10-30 DIAGNOSIS — S32445A Nondisplaced fracture of posterior column [ilioischial] of left acetabulum, initial encounter for closed fracture: Secondary | ICD-10-CM | POA: Diagnosis not present

## 2021-10-30 DIAGNOSIS — R52 Pain, unspecified: Secondary | ICD-10-CM | POA: Diagnosis not present

## 2021-10-30 DIAGNOSIS — D45 Polycythemia vera: Secondary | ICD-10-CM | POA: Diagnosis not present

## 2021-10-30 DIAGNOSIS — S299XXA Unspecified injury of thorax, initial encounter: Secondary | ICD-10-CM | POA: Diagnosis not present

## 2021-10-30 DIAGNOSIS — R0902 Hypoxemia: Secondary | ICD-10-CM | POA: Diagnosis not present

## 2021-10-30 DIAGNOSIS — S32444A Nondisplaced fracture of posterior column [ilioischial] of right acetabulum, initial encounter for closed fracture: Secondary | ICD-10-CM | POA: Diagnosis not present

## 2021-10-30 DIAGNOSIS — W11XXXA Fall on and from ladder, initial encounter: Secondary | ICD-10-CM | POA: Diagnosis not present

## 2021-10-30 DIAGNOSIS — S32512A Fracture of superior rim of left pubis, initial encounter for closed fracture: Secondary | ICD-10-CM | POA: Diagnosis not present

## 2021-10-30 DIAGNOSIS — S199XXA Unspecified injury of neck, initial encounter: Secondary | ICD-10-CM | POA: Diagnosis not present

## 2021-10-30 DIAGNOSIS — W19XXXA Unspecified fall, initial encounter: Secondary | ICD-10-CM | POA: Diagnosis not present

## 2021-10-31 DIAGNOSIS — G4733 Obstructive sleep apnea (adult) (pediatric): Secondary | ICD-10-CM | POA: Diagnosis not present

## 2021-10-31 DIAGNOSIS — Z7901 Long term (current) use of anticoagulants: Secondary | ICD-10-CM | POA: Diagnosis not present

## 2021-10-31 DIAGNOSIS — S32512A Fracture of superior rim of left pubis, initial encounter for closed fracture: Secondary | ICD-10-CM | POA: Diagnosis not present

## 2021-10-31 DIAGNOSIS — R8271 Bacteriuria: Secondary | ICD-10-CM | POA: Diagnosis not present

## 2021-10-31 DIAGNOSIS — F17210 Nicotine dependence, cigarettes, uncomplicated: Secondary | ICD-10-CM | POA: Diagnosis not present

## 2021-10-31 DIAGNOSIS — S32435A Nondisplaced fracture of anterior column [iliopubic] of left acetabulum, initial encounter for closed fracture: Secondary | ICD-10-CM | POA: Diagnosis not present

## 2021-10-31 DIAGNOSIS — S32445A Nondisplaced fracture of posterior column [ilioischial] of left acetabulum, initial encounter for closed fracture: Secondary | ICD-10-CM | POA: Diagnosis not present

## 2021-10-31 DIAGNOSIS — W11XXXA Fall on and from ladder, initial encounter: Secondary | ICD-10-CM | POA: Diagnosis not present

## 2021-11-01 DIAGNOSIS — D45 Polycythemia vera: Secondary | ICD-10-CM | POA: Diagnosis not present

## 2021-11-01 DIAGNOSIS — S32444A Nondisplaced fracture of posterior column [ilioischial] of right acetabulum, initial encounter for closed fracture: Secondary | ICD-10-CM | POA: Diagnosis not present

## 2021-11-01 DIAGNOSIS — I480 Paroxysmal atrial fibrillation: Secondary | ICD-10-CM | POA: Diagnosis not present

## 2021-11-01 DIAGNOSIS — N400 Enlarged prostate without lower urinary tract symptoms: Secondary | ICD-10-CM | POA: Diagnosis not present

## 2021-11-01 DIAGNOSIS — Z9981 Dependence on supplemental oxygen: Secondary | ICD-10-CM | POA: Diagnosis not present

## 2021-11-01 DIAGNOSIS — G4733 Obstructive sleep apnea (adult) (pediatric): Secondary | ICD-10-CM | POA: Diagnosis not present

## 2021-11-01 DIAGNOSIS — S32435A Nondisplaced fracture of anterior column [iliopubic] of left acetabulum, initial encounter for closed fracture: Secondary | ICD-10-CM | POA: Diagnosis not present

## 2021-11-01 DIAGNOSIS — S32512A Fracture of superior rim of left pubis, initial encounter for closed fracture: Secondary | ICD-10-CM | POA: Diagnosis not present

## 2021-11-02 DIAGNOSIS — S32444A Nondisplaced fracture of posterior column [ilioischial] of right acetabulum, initial encounter for closed fracture: Secondary | ICD-10-CM | POA: Diagnosis not present

## 2021-11-02 DIAGNOSIS — S32402A Unspecified fracture of left acetabulum, initial encounter for closed fracture: Secondary | ICD-10-CM | POA: Diagnosis not present

## 2021-11-02 DIAGNOSIS — S32435A Nondisplaced fracture of anterior column [iliopubic] of left acetabulum, initial encounter for closed fracture: Secondary | ICD-10-CM | POA: Diagnosis not present

## 2021-11-02 DIAGNOSIS — D45 Polycythemia vera: Secondary | ICD-10-CM | POA: Diagnosis not present

## 2021-11-02 DIAGNOSIS — S32445A Nondisplaced fracture of posterior column [ilioischial] of left acetabulum, initial encounter for closed fracture: Secondary | ICD-10-CM | POA: Diagnosis not present

## 2021-11-02 DIAGNOSIS — S32512A Fracture of superior rim of left pubis, initial encounter for closed fracture: Secondary | ICD-10-CM | POA: Diagnosis not present

## 2021-11-03 DIAGNOSIS — S32402A Unspecified fracture of left acetabulum, initial encounter for closed fracture: Secondary | ICD-10-CM | POA: Diagnosis not present

## 2021-11-03 DIAGNOSIS — W11XXXA Fall on and from ladder, initial encounter: Secondary | ICD-10-CM | POA: Diagnosis not present

## 2021-11-03 DIAGNOSIS — I10 Essential (primary) hypertension: Secondary | ICD-10-CM | POA: Diagnosis not present

## 2021-11-03 DIAGNOSIS — S0081XA Abrasion of other part of head, initial encounter: Secondary | ICD-10-CM | POA: Diagnosis not present

## 2021-11-03 DIAGNOSIS — S32512A Fracture of superior rim of left pubis, initial encounter for closed fracture: Secondary | ICD-10-CM | POA: Diagnosis not present

## 2021-11-03 DIAGNOSIS — E785 Hyperlipidemia, unspecified: Secondary | ICD-10-CM | POA: Diagnosis not present

## 2021-11-03 DIAGNOSIS — S32444A Nondisplaced fracture of posterior column [ilioischial] of right acetabulum, initial encounter for closed fracture: Secondary | ICD-10-CM | POA: Diagnosis not present

## 2021-11-03 DIAGNOSIS — Z7901 Long term (current) use of anticoagulants: Secondary | ICD-10-CM | POA: Diagnosis not present

## 2021-11-03 DIAGNOSIS — S32435A Nondisplaced fracture of anterior column [iliopubic] of left acetabulum, initial encounter for closed fracture: Secondary | ICD-10-CM | POA: Diagnosis not present

## 2021-11-03 DIAGNOSIS — R8271 Bacteriuria: Secondary | ICD-10-CM | POA: Diagnosis not present

## 2021-11-03 DIAGNOSIS — I480 Paroxysmal atrial fibrillation: Secondary | ICD-10-CM | POA: Diagnosis not present

## 2021-11-03 DIAGNOSIS — Z880 Allergy status to penicillin: Secondary | ICD-10-CM | POA: Diagnosis not present

## 2021-11-03 DIAGNOSIS — N400 Enlarged prostate without lower urinary tract symptoms: Secondary | ICD-10-CM | POA: Diagnosis not present

## 2021-11-03 DIAGNOSIS — F17210 Nicotine dependence, cigarettes, uncomplicated: Secondary | ICD-10-CM | POA: Diagnosis not present

## 2021-11-03 DIAGNOSIS — D45 Polycythemia vera: Secondary | ICD-10-CM | POA: Diagnosis not present

## 2021-11-03 DIAGNOSIS — G4733 Obstructive sleep apnea (adult) (pediatric): Secondary | ICD-10-CM | POA: Diagnosis not present

## 2021-11-04 ENCOUNTER — Institutional Professional Consult (permissible substitution): Payer: BC Managed Care – PPO | Admitting: Cardiology

## 2021-11-05 ENCOUNTER — Other Ambulatory Visit: Payer: Self-pay

## 2021-11-05 MED ORDER — APIXABAN 5 MG PO TABS: 5.0000 mg | ORAL_TABLET | Freq: Two times a day (BID) | ORAL | 1 refills | Status: DC

## 2021-11-05 NOTE — Telephone Encounter (Signed)
Prescription refill request for Eliquis received. Indication: Afib  Last office visit:10/15/21  Scr: 0.67 (11/04/21)  Age: 65 Weight: 127.9kg  Appropriate dose and refill sent to requested pharmacy.

## 2021-11-06 DIAGNOSIS — I10 Essential (primary) hypertension: Secondary | ICD-10-CM | POA: Diagnosis not present

## 2021-11-06 DIAGNOSIS — W11XXXD Fall on and from ladder, subsequent encounter: Secondary | ICD-10-CM | POA: Diagnosis not present

## 2021-11-06 DIAGNOSIS — D45 Polycythemia vera: Secondary | ICD-10-CM | POA: Diagnosis not present

## 2021-11-06 DIAGNOSIS — S32591D Other specified fracture of right pubis, subsequent encounter for fracture with routine healing: Secondary | ICD-10-CM | POA: Diagnosis not present

## 2021-11-06 DIAGNOSIS — F17210 Nicotine dependence, cigarettes, uncomplicated: Secondary | ICD-10-CM | POA: Diagnosis not present

## 2021-11-06 DIAGNOSIS — Z7901 Long term (current) use of anticoagulants: Secondary | ICD-10-CM | POA: Diagnosis not present

## 2021-11-06 DIAGNOSIS — S32402D Unspecified fracture of left acetabulum, subsequent encounter for fracture with routine healing: Secondary | ICD-10-CM | POA: Diagnosis not present

## 2021-11-06 DIAGNOSIS — G4733 Obstructive sleep apnea (adult) (pediatric): Secondary | ICD-10-CM | POA: Diagnosis not present

## 2021-11-06 DIAGNOSIS — I480 Paroxysmal atrial fibrillation: Secondary | ICD-10-CM | POA: Diagnosis not present

## 2021-11-06 DIAGNOSIS — N400 Enlarged prostate without lower urinary tract symptoms: Secondary | ICD-10-CM | POA: Diagnosis not present

## 2021-11-10 DIAGNOSIS — I10 Essential (primary) hypertension: Secondary | ICD-10-CM | POA: Diagnosis not present

## 2021-11-10 DIAGNOSIS — F17210 Nicotine dependence, cigarettes, uncomplicated: Secondary | ICD-10-CM | POA: Diagnosis not present

## 2021-11-10 DIAGNOSIS — W11XXXD Fall on and from ladder, subsequent encounter: Secondary | ICD-10-CM | POA: Diagnosis not present

## 2021-11-10 DIAGNOSIS — N400 Enlarged prostate without lower urinary tract symptoms: Secondary | ICD-10-CM | POA: Diagnosis not present

## 2021-11-10 DIAGNOSIS — S32591D Other specified fracture of right pubis, subsequent encounter for fracture with routine healing: Secondary | ICD-10-CM | POA: Diagnosis not present

## 2021-11-10 DIAGNOSIS — G4733 Obstructive sleep apnea (adult) (pediatric): Secondary | ICD-10-CM | POA: Diagnosis not present

## 2021-11-10 DIAGNOSIS — Z7901 Long term (current) use of anticoagulants: Secondary | ICD-10-CM | POA: Diagnosis not present

## 2021-11-10 DIAGNOSIS — S32402D Unspecified fracture of left acetabulum, subsequent encounter for fracture with routine healing: Secondary | ICD-10-CM | POA: Diagnosis not present

## 2021-11-10 DIAGNOSIS — D45 Polycythemia vera: Secondary | ICD-10-CM | POA: Diagnosis not present

## 2021-11-10 DIAGNOSIS — I480 Paroxysmal atrial fibrillation: Secondary | ICD-10-CM | POA: Diagnosis not present

## 2021-11-11 DIAGNOSIS — N400 Enlarged prostate without lower urinary tract symptoms: Secondary | ICD-10-CM | POA: Diagnosis not present

## 2021-11-11 DIAGNOSIS — S32591D Other specified fracture of right pubis, subsequent encounter for fracture with routine healing: Secondary | ICD-10-CM | POA: Diagnosis not present

## 2021-11-11 DIAGNOSIS — Z7901 Long term (current) use of anticoagulants: Secondary | ICD-10-CM | POA: Diagnosis not present

## 2021-11-11 DIAGNOSIS — G4733 Obstructive sleep apnea (adult) (pediatric): Secondary | ICD-10-CM | POA: Diagnosis not present

## 2021-11-11 DIAGNOSIS — D45 Polycythemia vera: Secondary | ICD-10-CM | POA: Diagnosis not present

## 2021-11-11 DIAGNOSIS — I10 Essential (primary) hypertension: Secondary | ICD-10-CM | POA: Diagnosis not present

## 2021-11-11 DIAGNOSIS — F17210 Nicotine dependence, cigarettes, uncomplicated: Secondary | ICD-10-CM | POA: Diagnosis not present

## 2021-11-11 DIAGNOSIS — S32402D Unspecified fracture of left acetabulum, subsequent encounter for fracture with routine healing: Secondary | ICD-10-CM | POA: Diagnosis not present

## 2021-11-11 DIAGNOSIS — I480 Paroxysmal atrial fibrillation: Secondary | ICD-10-CM | POA: Diagnosis not present

## 2021-11-11 DIAGNOSIS — W11XXXD Fall on and from ladder, subsequent encounter: Secondary | ICD-10-CM | POA: Diagnosis not present

## 2021-11-13 DIAGNOSIS — I10 Essential (primary) hypertension: Secondary | ICD-10-CM | POA: Diagnosis not present

## 2021-11-13 DIAGNOSIS — W11XXXD Fall on and from ladder, subsequent encounter: Secondary | ICD-10-CM | POA: Diagnosis not present

## 2021-11-13 DIAGNOSIS — N400 Enlarged prostate without lower urinary tract symptoms: Secondary | ICD-10-CM | POA: Diagnosis not present

## 2021-11-13 DIAGNOSIS — S32402D Unspecified fracture of left acetabulum, subsequent encounter for fracture with routine healing: Secondary | ICD-10-CM | POA: Diagnosis not present

## 2021-11-13 DIAGNOSIS — D45 Polycythemia vera: Secondary | ICD-10-CM | POA: Diagnosis not present

## 2021-11-13 DIAGNOSIS — F17210 Nicotine dependence, cigarettes, uncomplicated: Secondary | ICD-10-CM | POA: Diagnosis not present

## 2021-11-13 DIAGNOSIS — S32591D Other specified fracture of right pubis, subsequent encounter for fracture with routine healing: Secondary | ICD-10-CM | POA: Diagnosis not present

## 2021-11-13 DIAGNOSIS — G4733 Obstructive sleep apnea (adult) (pediatric): Secondary | ICD-10-CM | POA: Diagnosis not present

## 2021-11-13 DIAGNOSIS — I480 Paroxysmal atrial fibrillation: Secondary | ICD-10-CM | POA: Diagnosis not present

## 2021-11-13 DIAGNOSIS — Z7901 Long term (current) use of anticoagulants: Secondary | ICD-10-CM | POA: Diagnosis not present

## 2021-11-18 DIAGNOSIS — I480 Paroxysmal atrial fibrillation: Secondary | ICD-10-CM | POA: Diagnosis not present

## 2021-11-18 DIAGNOSIS — S32402D Unspecified fracture of left acetabulum, subsequent encounter for fracture with routine healing: Secondary | ICD-10-CM | POA: Diagnosis not present

## 2021-11-18 DIAGNOSIS — N400 Enlarged prostate without lower urinary tract symptoms: Secondary | ICD-10-CM | POA: Diagnosis not present

## 2021-11-18 DIAGNOSIS — Z7901 Long term (current) use of anticoagulants: Secondary | ICD-10-CM | POA: Diagnosis not present

## 2021-11-18 DIAGNOSIS — F17210 Nicotine dependence, cigarettes, uncomplicated: Secondary | ICD-10-CM | POA: Diagnosis not present

## 2021-11-18 DIAGNOSIS — I10 Essential (primary) hypertension: Secondary | ICD-10-CM | POA: Diagnosis not present

## 2021-11-18 DIAGNOSIS — D45 Polycythemia vera: Secondary | ICD-10-CM | POA: Diagnosis not present

## 2021-11-18 DIAGNOSIS — W11XXXD Fall on and from ladder, subsequent encounter: Secondary | ICD-10-CM | POA: Diagnosis not present

## 2021-11-18 DIAGNOSIS — S32591D Other specified fracture of right pubis, subsequent encounter for fracture with routine healing: Secondary | ICD-10-CM | POA: Diagnosis not present

## 2021-11-18 DIAGNOSIS — G4733 Obstructive sleep apnea (adult) (pediatric): Secondary | ICD-10-CM | POA: Diagnosis not present

## 2021-11-24 DIAGNOSIS — I10 Essential (primary) hypertension: Secondary | ICD-10-CM | POA: Diagnosis not present

## 2021-11-24 DIAGNOSIS — S32402D Unspecified fracture of left acetabulum, subsequent encounter for fracture with routine healing: Secondary | ICD-10-CM | POA: Diagnosis not present

## 2021-11-24 DIAGNOSIS — Z7901 Long term (current) use of anticoagulants: Secondary | ICD-10-CM | POA: Diagnosis not present

## 2021-11-24 DIAGNOSIS — F17210 Nicotine dependence, cigarettes, uncomplicated: Secondary | ICD-10-CM | POA: Diagnosis not present

## 2021-11-24 DIAGNOSIS — D45 Polycythemia vera: Secondary | ICD-10-CM | POA: Diagnosis not present

## 2021-11-24 DIAGNOSIS — I480 Paroxysmal atrial fibrillation: Secondary | ICD-10-CM | POA: Diagnosis not present

## 2021-11-24 DIAGNOSIS — W11XXXD Fall on and from ladder, subsequent encounter: Secondary | ICD-10-CM | POA: Diagnosis not present

## 2021-11-24 DIAGNOSIS — N400 Enlarged prostate without lower urinary tract symptoms: Secondary | ICD-10-CM | POA: Diagnosis not present

## 2021-11-24 DIAGNOSIS — S32591D Other specified fracture of right pubis, subsequent encounter for fracture with routine healing: Secondary | ICD-10-CM | POA: Diagnosis not present

## 2021-11-24 DIAGNOSIS — G4733 Obstructive sleep apnea (adult) (pediatric): Secondary | ICD-10-CM | POA: Diagnosis not present

## 2021-12-16 DIAGNOSIS — S32442D Displaced fracture of posterior column [ilioischial] of left acetabulum, subsequent encounter for fracture with routine healing: Secondary | ICD-10-CM | POA: Diagnosis not present

## 2021-12-16 DIAGNOSIS — W11XXXD Fall on and from ladder, subsequent encounter: Secondary | ICD-10-CM | POA: Diagnosis not present

## 2021-12-16 DIAGNOSIS — Z9181 History of falling: Secondary | ICD-10-CM | POA: Diagnosis not present

## 2021-12-16 DIAGNOSIS — N400 Enlarged prostate without lower urinary tract symptoms: Secondary | ICD-10-CM | POA: Diagnosis not present

## 2021-12-16 DIAGNOSIS — S32502D Unspecified fracture of left pubis, subsequent encounter for fracture with routine healing: Secondary | ICD-10-CM | POA: Diagnosis not present

## 2021-12-16 DIAGNOSIS — R8271 Bacteriuria: Secondary | ICD-10-CM | POA: Diagnosis not present

## 2021-12-16 DIAGNOSIS — F17210 Nicotine dependence, cigarettes, uncomplicated: Secondary | ICD-10-CM | POA: Diagnosis not present

## 2021-12-16 DIAGNOSIS — G4733 Obstructive sleep apnea (adult) (pediatric): Secondary | ICD-10-CM | POA: Diagnosis not present

## 2021-12-16 DIAGNOSIS — I10 Essential (primary) hypertension: Secondary | ICD-10-CM | POA: Diagnosis not present

## 2021-12-16 DIAGNOSIS — S32432D Displaced fracture of anterior column [iliopubic] of left acetabulum, subsequent encounter for fracture with routine healing: Secondary | ICD-10-CM | POA: Diagnosis not present

## 2021-12-16 DIAGNOSIS — I480 Paroxysmal atrial fibrillation: Secondary | ICD-10-CM | POA: Diagnosis not present

## 2021-12-16 DIAGNOSIS — Z7901 Long term (current) use of anticoagulants: Secondary | ICD-10-CM | POA: Diagnosis not present

## 2022-01-05 DIAGNOSIS — S32401D Unspecified fracture of right acetabulum, subsequent encounter for fracture with routine healing: Secondary | ICD-10-CM | POA: Diagnosis not present

## 2022-01-18 DIAGNOSIS — R293 Abnormal posture: Secondary | ICD-10-CM | POA: Diagnosis not present

## 2022-01-18 DIAGNOSIS — M6281 Muscle weakness (generalized): Secondary | ICD-10-CM | POA: Diagnosis not present

## 2022-01-18 DIAGNOSIS — M25552 Pain in left hip: Secondary | ICD-10-CM | POA: Diagnosis not present

## 2022-01-18 DIAGNOSIS — R262 Difficulty in walking, not elsewhere classified: Secondary | ICD-10-CM | POA: Diagnosis not present

## 2022-01-19 NOTE — Progress Notes (Signed)
Electrophysiology Office Note:    Date:  01/20/2022   ID:  Jason Bolton, DOB [birth year], MRN [id number]  PCP:  Gwyneth Sprout, [facility] Cardiologist:  Kate Sable, MD  [facility] Electrophysiologist:  Vickie Epley, MD   Referring MD: Kate Sable, MD   Chief Complaint: AF  History of Present Illness:    Jason Bolton is a 66 y.o. male who presents for an evaluation of AF at the request of Dr Garen Lah. Their medical history includes HTN, HLD, OSA on CPAP, prior tobacco use and persistent AF. He previously saw Dr Garen Lah 10/15/2021. He was previously cardioverted in 02/2021. At that appointment he was back in AF. The patient has a mildly reduced EF and because of this he was referred to consider rhythm control options.   Prior to his cardioversion, the patient reported fatigue and significant lower extremity swelling associate with his atrial fibrillation.  This improved after his cardioversion.  At the time of his most recent follow-up with Dr. Garen Lah he was back in atrial fibrillation although he does continue to feel better than he was before the cardioversion.  He takes Eliquis for stroke prophylaxis.  He is interested in avoiding exposure to antiarrhythmic drug therapy.  I did discuss dofetilide and amiodarone during today's visit.  He is recovering after a fall from a ladder.  He was actually using a walker for several months as he regained the use of his left leg.  He is now back to walking without a walker.  He is ambulating.  He goes grocery shopping without assistance.  He is very motivated.  He is interested in weight loss.  He does have sleep apnea and uses CPAP nightly.     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   Polycythemia    Sleep apnea     Past Surgical History:  Procedure Laterality Date    CARDIOVERSION N/A 03/04/2021   Procedure: CARDIOVERSION;  Surgeon: Kate Sable, MD;  Location: [facility] ORS;  Service: Cardiovascular;  Laterality: N/A;   CATARACT EXTRACTION Right 11/2013   COLONOSCOPY  2006   COLONOSCOPY WITH PROPOFOL N/A 10/19/2017   Procedure: COLONOSCOPY WITH PROPOFOL;  Surgeon: [name], MD;  Location: [facility] ENDOSCOPY;  Service: Endoscopy;  Laterality: N/A;   INGUINAL HERNIA REPAIR Right 1972   MANDIBLE SURGERY  2005   [facility]   PELVIC FRACTURE SURGERY  10/2021    Current Medications: Current Meds  Medication Sig   apixaban (ELIQUIS) 5 MG TABS tablet Take 1 tablet (5 mg total) by mouth 2 (two) times daily.   Coenzyme Q10 50 MG CAPS Take 50 mg by mouth every evening. CO ENZYME Q-10, 50MG  (Oral Capsule)  1 po qd for 0 days  Quantity: 30.00;  Refills: 0   Ordered :31-Aug-2010  Margarita Rana MD;  Started 31-Mar-2009 Active Comments: DX: 272.0   lisinopril-hydrochlorothiazide (ZESTORETIC) 20-12.5 MG tablet Take 1 tablet by mouth 2 (two) times daily.   metoprolol tartrate (LOPRESSOR) 50 MG tablet Take 1 tablet (50 mg total) by mouth 2 (two) times daily.   OMEGA-3 FATTY ACIDS PO Take 1 capsule by mouth every evening. FISH-EPA, 1000MG  (Oral Capsule)  2 po bid for 0 days  Quantity: 0.00;  Refills: 0   Ordered :31-Aug-2010  Margarita Rana MD;  Started 19-June-2007 Active Comments: DX: 272.0  Polyethyl Glycol-Propyl Glycol (LUBRICANT EYE DROPS) 0.4-0.3 % SOLN Place 1-2 drops into both eyes 3 (three) times daily as needed (dry/irritated eyes).   sildenafil (VIAGRA) 50 MG tablet TAKE 1 TABLET BY MOUTH AS NEEDED FOR ERECTILE DYSFUNCTION GENERIC EQUIVALENT FOR VIAGRA   simvastatin (ZOCOR) 20 MG tablet Take 1 tablet (20 mg total) by mouth at bedtime.   tamsulosin (FLOMAX) 0.4 MG CAPS capsule TAKE 2 CAPSULES BY MOUTH DAILY GENERIC EQUIVALENT FOR FLOMAX     Allergies:   Penicillins   Social History   Socioeconomic History   Marital status: Married    Spouse name: Not on  file   Number of children: 2   Years of education: Not on file   Highest education level: Not on file  Occupational History   Occupation: supervisor  Tobacco Use   Smoking status: Former    Packs/day: 1.50    Years: 20.00    Pack years: 30.00    Types: Cigarettes    Quit date: 11/29/2015    Years since quitting: 6.1   Smokeless tobacco: Never   Tobacco comments:    quit June of 2008 after Chantix. he was exposed to secondary smoke  Vaping Use   Vaping Use: Never used  Substance and Sexual Activity   Alcohol use: Yes    Alcohol/week: 0.0 standard drinks    Comment: occasional   Drug use: No   Sexual activity: Not on file  Other Topics Concern   Not on file  Social History Narrative   Not on file   Social Determinants of Health   Financial Resource Strain: Not on file  Food Insecurity: Not on file  Transportation Needs: Not on file  Physical Activity: Not on file  Stress: Not on file  Social Connections: Not on file     Family History: The patient's family history includes Congestive Heart Failure in his mother; Diabetes in his mother; Healthy in his brother, brother, brother, and sister; Pancreatic cancer in his father.  ROS:   Please see the history of present illness.    All other systems reviewed and are negative.  EKGs/Labs/Other Studies Reviewed:    The following studies were reviewed today:  02/03/2021 Echo EF 45% RV mildly reduced RA severely dilated LA mildly dilated LV mildly dilated   EKG:  The ekg ordered today demonstrates atrial fibrillation with a ventricular rate of 69 bpm.   Recent Labs: 02/16/2021: B Natriuretic Peptide 256.3; Magnesium 1.8 02/23/2021: BUN 17; Creatinine, Ser 0.90; Potassium 4.2; Sodium 138 05/06/2021: Platelets 129 08/18/2021: Hemoglobin 15.1  Recent Lipid Panel    Component Value Date/Time   CHOL 122 01/14/2021 0913   TRIG 73 01/14/2021 0913   HDL 35 (L) 01/14/2021 0913   CHOLHDL 3.5 01/14/2021 0913   LDLCALC 72  01/14/2021 0913    Physical Exam:    VS:  BP 130/82 (BP Location: Left Arm, Patient Position: Sitting, Cuff Size: Normal)    Pulse 69    Ht 6\' 1"  (1.854 m)    Wt 286 lb (129.7 kg)    SpO2 94%    BMI 37.73 kg/m     Wt Readings from Last 3 Encounters:  01/20/22 286 lb (129.7 kg)  10/15/21 282 lb (127.9 kg)  09/10/21 287 lb 4.8 oz (130.3 kg)     GEN:  Well nourished, well developed in no acute distress.  Obese HEENT: Normal NECK: No JVD; No carotid bruits LYMPHATICS: No lymphadenopathy CARDIAC: Irregularly irregular, no murmurs, rubs, gallops RESPIRATORY:  Clear to auscultation without rales, wheezing or rhonchi  ABDOMEN: Soft, non-tender, non-distended MUSCULOSKELETAL:  No edema; No deformity  SKIN: Warm and dry NEUROLOGIC:  Alert and oriented x 3 PSYCHIATRIC:  Normal affect       ASSESSMENT:    1. Persistent atrial fibrillation (HCC)   2. HFrEF (heart failure with reduced ejection fraction) ([place])   3. Primary hypertension    PLAN:    In order of problems listed above:  #Persistent atrial fibrillation With evidence of heart failure in the past and a mildly reduced left ventricular function.  I do think a rhythm control strategy is indicated.  I discussed the possibility of using antiarrhythmic drugs versus catheter ablation to achieve this.  He would like to pursue catheter ablation which I think is a reasonable for step.  I did discuss the possibility of needing an antiarrhythmic drug or repeat ablation depending on his results after this procedure.  I discussed the procedure in detail with the patient including the risk, recovery and likelihood of success.  He would need an echo prior to the procedure.  Risk, benefits, and alternatives to EP study and radiofrequency ablation for afib were also discussed in detail today. These risks include but are not limited to stroke, bleeding, vascular damage, tamponade, perforation, damage to the esophagus, lungs, and other structures,  pulmonary vein stenosis, worsening renal function, and death. The patient understands these risk and wishes to proceed.  We will therefore proceed with catheter ablation at the next available time.  Carto, ICE, anesthesia are requested for the procedure.  Will also obtain CT PV protocol prior to the procedure to exclude LAA thrombus and further evaluate atrial anatomy.  #Chronic systolic heart failure NYHA class II today.  Class III symptoms described in the past.  Continue hydrochlorothiazide-lisinopril, metoprolol. Rhythm control as above.  Total time spent with patient today 60 minutes. This includes reviewing records, evaluating the patient and coordinating care.  Medication Adjustments/Labs and Tests Ordered: Current medicines are reviewed at length with the patient today.  Concerns regarding medicines are outlined above.  No orders of the defined types were placed in this encounter.  No orders of the defined types were placed in this encounter.    Signed, Hilton Cork. Quentin Ore, MD, [facility], [facility] 01/20/2022 10:44 AM    Electrophysiology [facility]

## 2022-01-20 ENCOUNTER — Encounter: Payer: Self-pay | Admitting: Cardiology

## 2022-01-20 ENCOUNTER — Other Ambulatory Visit: Payer: Self-pay

## 2022-01-20 ENCOUNTER — Encounter: Payer: Self-pay | Admitting: *Deleted

## 2022-01-20 ENCOUNTER — Ambulatory Visit: Payer: Self-pay | Admitting: Cardiology

## 2022-01-20 ENCOUNTER — Encounter: Payer: Self-pay | Admitting: Oncology

## 2022-01-20 ENCOUNTER — Telehealth: Payer: Self-pay | Admitting: Cardiology

## 2022-01-20 VITALS — BP 130/82 | HR 69 | Ht 73.0 in | Wt 286.0 lb

## 2022-01-20 DIAGNOSIS — I4819 Other persistent atrial fibrillation: Secondary | ICD-10-CM

## 2022-01-20 DIAGNOSIS — Z01818 Encounter for other preprocedural examination: Secondary | ICD-10-CM

## 2022-01-20 DIAGNOSIS — I5020 Unspecified systolic (congestive) heart failure: Secondary | ICD-10-CM

## 2022-01-20 DIAGNOSIS — I10 Essential (primary) hypertension: Secondary | ICD-10-CM

## 2022-01-20 DIAGNOSIS — I4891 Unspecified atrial fibrillation: Secondary | ICD-10-CM

## 2022-01-20 MED ORDER — APIXABAN 5 MG PO TABS: 5.0000 mg | ORAL_TABLET | Freq: Two times a day (BID) | ORAL | 3 refills | Status: DC

## 2022-01-20 NOTE — Telephone Encounter (Signed)
Patient calling to see if he needs to get that new shot for covid

## 2022-01-20 NOTE — Patient Instructions (Addendum)
Medications: Your physician recommends that you continue on your current medications as directed. Please refer to the Current Medication list given to you today. *If you need a refill on your cardiac medications before your next appointment, please call your pharmacy*  Lab Work: None. If you have labs (blood work) drawn today and your tests are completely normal, you will receive your results only by: [place] (if you have MyChart) OR A paper copy in the mail If you have any lab test that is abnormal or we need to change your treatment, we will call you to review the results.  Testing/Procedures: Your physician has requested that you have an echocardiogram. Echocardiography is a painless test that uses sound waves to create images of your heart. It provides your doctor with information about the size and shape of your heart and how well your hearts chambers and valves are working. This procedure takes approximately one hour. There are no restrictions for this procedure.  Your physician has requested that you have cardiac CT. Cardiac computed tomography (CT) is a painless test that uses an x-ray machine to take clear, detailed pictures of your heart. For further information please visit [email or web address]. Please follow instruction sheet as given.  Your physician has recommended that you have an ablation. Catheter ablation is a medical procedure used to treat some cardiac arrhythmias (irregular heartbeats). During catheter ablation, a long, thin, flexible tube is put into a blood vessel in your groin (upper thigh), or neck. This tube is called an ablation catheter. It is then guided to your heart through the blood vessel. Radio frequency waves destroy small areas of heart tissue where abnormal heartbeats may cause an arrhythmia to start. Please see the instruction sheet given to you today.   Follow-Up: At [facility], you and your health needs are our priority.  As part of our  continuing mission to provide you with exceptional heart care, we have created designated Provider Care Teams.  These Care Teams include your primary Cardiologist (physician) and Advanced Practice Providers (APPs -  Physician Assistants and Nurse Practitioners) who all work together to provide you with the care you need, when you need it.  Your physician wants you to follow-up in: see letter.   We recommend signing up for the patient portal called "MyChart".  Sign up information is provided on this After Visit Summary.  MyChart is used to connect with patients for Virtual Visits (Telemedicine).  Patients are able to view lab/test results, encounter notes, upcoming appointments, etc.  Non-urgent messages can be sent to your provider as well.   To learn more about what you can do with MyChart, go to [email or web address].    Any Other Special Instructions Will Be Listed Below (If Applicable).  Cardiac Ablation Cardiac ablation is a procedure to destroy (ablate) some heart tissue that is sending bad signals. These bad signals cause problems in heart rhythm. The heart has many areas that make these signals. If there are problems in these areas, they can make the heart beat in a way that is not normal. Destroying some tissues can help make the heart rhythm normal. Tell your doctor about: Any allergies you have. All medicines you are taking. These include vitamins, herbs, eye drops, creams, and over-the-counter medicines. Any problems you or family members have had with medicines that make you fall asleep (anesthetics). Any blood disorders you have. Any surgeries you have had. Any medical conditions you have, such as kidney failure. Whether you are pregnant or may be  pregnant. What are the risks? This is a safe procedure. But problems may occur, including: Infection. Bruising and bleeding. Bleeding into the chest. Stroke or blood clots. Damage to nearby areas of your body. Allergies to  medicines or dyes. The need for a pacemaker if the normal system is damaged. Failure of the procedure to treat the problem. What happens before the procedure? Medicines Ask your doctor about: Changing or stopping your normal medicines. This is important. Taking aspirin and ibuprofen. Do not take these medicines unless your doctor tells you to take them. Taking other medicines, vitamins, herbs, and supplements. General instructions Follow instructions from your doctor about what you cannot eat or drink. Plan to have someone take you home from the hospital or clinic. If you will be going home right after the procedure, plan to have someone with you for 24 hours. Ask your doctor what steps will be taken to prevent infection. What happens during the procedure?  An IV tube will be put into one of your veins. You will be given a medicine to help you relax. The skin on your neck or groin will be numbed. A cut (incision) will be made in your neck or groin. A needle will be put through your cut and into a large vein. A tube (catheter) will be put into the needle. The tube will be moved to your heart. Dye may be put through the tube. This helps your doctor see your heart. Small devices (electrodes) on the tube will send out signals. A type of energy will be used to destroy some heart tissue. The tube will be taken out. Pressure will be held on your cut. This helps stop bleeding. A bandage will be put over your cut. The exact procedure may vary among doctors and hospitals. What happens after the procedure? You will be watched until you leave the hospital or clinic. This includes checking your heart rate, breathing rate, oxygen, and blood pressure. Your cut will be watched for bleeding. You will need to lie still for a few hours. Do not drive for 24 hours or as long as your doctor tells you. Summary Cardiac ablation is a procedure to destroy some heart tissue. This is done to treat heart rhythm  problems. Tell your doctor about any medical conditions you may have. Tell him or her about all medicines you are taking to treat them. This is a safe procedure. But problems may occur. These include infection, bruising, bleeding, and damage to nearby areas of your body. Follow what your doctor tells you about food and drink. You may also be told to change or stop some of your medicines. After the procedure, do not drive for 24 hours or as long as your doctor tells you. This information is not intended to replace advice given to you by your health care provider. Make sure you discuss any questions you have with your health care provider. Document Revised: 10/18/2019 Document Reviewed: 10/18/2019 Elsevier Patient Education  2022 [employer].

## 2022-01-25 ENCOUNTER — Encounter: Payer: Self-pay | Admitting: Oncology

## 2022-01-27 ENCOUNTER — Encounter: Payer: Self-pay | Admitting: *Deleted

## 2022-01-27 DIAGNOSIS — M25552 Pain in left hip: Secondary | ICD-10-CM | POA: Diagnosis not present

## 2022-01-27 DIAGNOSIS — M6281 Muscle weakness (generalized): Secondary | ICD-10-CM | POA: Diagnosis not present

## 2022-01-27 DIAGNOSIS — R262 Difficulty in walking, not elsewhere classified: Secondary | ICD-10-CM | POA: Diagnosis not present

## 2022-01-27 DIAGNOSIS — R293 Abnormal posture: Secondary | ICD-10-CM | POA: Diagnosis not present

## 2022-01-27 NOTE — Telephone Encounter (Signed)
Okay per MD Quentin Ore.  ?Patient verbalized understanding.  ?

## 2022-02-06 ENCOUNTER — Encounter: Payer: Self-pay | Admitting: Oncology

## 2022-02-10 ENCOUNTER — Other Ambulatory Visit: Payer: Self-pay

## 2022-02-10 ENCOUNTER — Ambulatory Visit (INDEPENDENT_AMBULATORY_CARE_PROVIDER_SITE_OTHER): Payer: BC Managed Care – PPO

## 2022-02-10 DIAGNOSIS — I4819 Other persistent atrial fibrillation: Secondary | ICD-10-CM

## 2022-02-10 DIAGNOSIS — I10 Essential (primary) hypertension: Secondary | ICD-10-CM | POA: Diagnosis not present

## 2022-02-10 DIAGNOSIS — I5020 Unspecified systolic (congestive) heart failure: Secondary | ICD-10-CM

## 2022-02-10 MED ORDER — PERFLUTREN LIPID MICROSPHERE
1.0000 mL | INTRAVENOUS | Status: AC | PRN
  Administered 2022-02-10: 2 mL via INTRAVENOUS

## 2022-02-11 DIAGNOSIS — M25552 Pain in left hip: Secondary | ICD-10-CM | POA: Diagnosis not present

## 2022-02-11 DIAGNOSIS — M6281 Muscle weakness (generalized): Secondary | ICD-10-CM | POA: Diagnosis not present

## 2022-02-11 DIAGNOSIS — R293 Abnormal posture: Secondary | ICD-10-CM | POA: Diagnosis not present

## 2022-02-11 DIAGNOSIS — R262 Difficulty in walking, not elsewhere classified: Secondary | ICD-10-CM | POA: Diagnosis not present

## 2022-02-11 LAB — ECHOCARDIOGRAM COMPLETE
AR max vel: 1.71 cm2
AV Area VTI: 1.79 cm2
AV Area mean vel: 1.69 cm2
AV Mean grad: 7 mmHg
AV Peak grad: 12.4 mmHg
Ao pk vel: 1.76 m/s
S' Lateral: 3 cm

## 2022-02-12 ENCOUNTER — Other Ambulatory Visit: Payer: BC Managed Care – PPO

## 2022-02-16 ENCOUNTER — Other Ambulatory Visit: Payer: Self-pay

## 2022-02-16 ENCOUNTER — Inpatient Hospital Stay: Payer: BC Managed Care – PPO | Attending: Oncology

## 2022-02-16 ENCOUNTER — Encounter: Payer: Self-pay | Admitting: Oncology

## 2022-02-16 ENCOUNTER — Inpatient Hospital Stay: Payer: BC Managed Care – PPO

## 2022-02-16 ENCOUNTER — Inpatient Hospital Stay (HOSPITAL_BASED_OUTPATIENT_CLINIC_OR_DEPARTMENT_OTHER): Payer: BC Managed Care – PPO | Admitting: Oncology

## 2022-02-16 VITALS — BP 132/89 | HR 69 | Temp 96.9°F | Resp 18 | Wt 287.0 lb

## 2022-02-16 DIAGNOSIS — D751 Secondary polycythemia: Secondary | ICD-10-CM | POA: Diagnosis not present

## 2022-02-16 DIAGNOSIS — I10 Essential (primary) hypertension: Secondary | ICD-10-CM | POA: Insufficient documentation

## 2022-02-16 DIAGNOSIS — Z800 Family history of malignant neoplasm of digestive organs: Secondary | ICD-10-CM | POA: Diagnosis not present

## 2022-02-16 DIAGNOSIS — R519 Headache, unspecified: Secondary | ICD-10-CM | POA: Insufficient documentation

## 2022-02-16 DIAGNOSIS — Z87891 Personal history of nicotine dependence: Secondary | ICD-10-CM | POA: Insufficient documentation

## 2022-02-16 LAB — CBC WITH DIFFERENTIAL/PLATELET
Abs Immature Granulocytes: 0.11 10*3/uL — ABNORMAL HIGH (ref 0.00–0.07)
Basophils Absolute: 0.1 10*3/uL (ref 0.0–0.1)
Basophils Relative: 1 %
Eosinophils Absolute: 0.2 10*3/uL (ref 0.0–0.5)
Eosinophils Relative: 2 %
HCT: 58.5 % — ABNORMAL HIGH (ref 39.0–52.0)
Hemoglobin: 18.4 g/dL — ABNORMAL HIGH (ref 13.0–17.0)
Immature Granulocytes: 1 %
Lymphocytes Relative: 18 %
Lymphs Abs: 1.7 10*3/uL (ref 0.7–4.0)
MCH: 28.1 pg (ref 26.0–34.0)
MCHC: 31.5 g/dL (ref 30.0–36.0)
MCV: 89.3 fL (ref 80.0–100.0)
Monocytes Absolute: 0.7 10*3/uL (ref 0.1–1.0)
Monocytes Relative: 7 %
Neutro Abs: 6.5 10*3/uL (ref 1.7–7.7)
Neutrophils Relative %: 71 %
Platelets: 160 10*3/uL (ref 150–400)
RBC: 6.55 MIL/uL — ABNORMAL HIGH (ref 4.22–5.81)
RDW: 17.1 % — ABNORMAL HIGH (ref 11.5–15.5)
WBC: 9.2 10*3/uL (ref 4.0–10.5)
nRBC: 0 % (ref 0.0–0.2)

## 2022-02-16 LAB — COMPREHENSIVE METABOLIC PANEL
ALT: 19 U/L (ref 0–44)
AST: 20 U/L (ref 15–41)
Albumin: 3.9 g/dL (ref 3.5–5.0)
Alkaline Phosphatase: 76 U/L (ref 38–126)
Anion gap: 7 (ref 5–15)
BUN: 14 mg/dL (ref 8–23)
CO2: 30 mmol/L (ref 22–32)
Calcium: 9.4 mg/dL (ref 8.9–10.3)
Chloride: 98 mmol/L (ref 98–111)
Creatinine, Ser: 0.78 mg/dL (ref 0.61–1.24)
GFR, Estimated: >60 mL/min (ref >60)
Glucose, Bld: 109 mg/dL — ABNORMAL HIGH (ref 70–99)
Potassium: 4.2 mmol/L (ref 3.5–5.1)
Sodium: 135 mmol/L (ref 135–145)
Total Bilirubin: 0.7 mg/dL (ref 0.3–1.2)
Total Protein: 7.4 g/dL (ref 6.5–8.1)

## 2022-02-16 LAB — LACTATE DEHYDROGENASE: LDH: 130 U/L (ref 98–192)

## 2022-02-16 NOTE — Progress Notes (Signed)
Patient reports a headache for the past 2-3 weeks which is an indicator that he is need of a phlebotomy.   ?

## 2022-02-16 NOTE — Progress Notes (Signed)
[facility] ?CONSULT NOTE ? ?Patient Care Team: ?Gwyneth Sprout, FNP as PCP - General (Family Medicine) ?Kate Sable, MD as PCP - Cardiology (Cardiology) ?Vickie Epley, MD as PCP - Electrophysiology (Cardiology) ?Trinna Post, PA-C (Inactive) as Physician Assistant (Physician Assistant) ?[name], MD (General Surgery) ?Lequita Asal, MD (Inactive) as Referring Physician (Hematology and Oncology) ? 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 ?#A. fib on Eliquis ? ?#  ?Oncology History  ?Erythrocytosis  ? ? ? ?HISTORY OF PRESENTING ILLNESS:  ?Jason Bolton is a 66 year old male with longstanding history of erythrocytosis of unclear etiology who receives frequent phlebotomies.  His last phlebotomy was in July 2022.  States he had several months where he did not need a phlebotomy and then fell off a ladder in October shattering his pelvis.  States he has been recovering ever since and has required several surgeries and rehab. ? ?Reports headache several weeks ago indicating his need for a phlebotomy.  No other hyperviscosity symptoms.  No strokelike symptoms. ? ?Review of Systems  ?Constitutional: Negative.  Negative for chills, fever, malaise/fatigue and weight loss.  ?HENT:  Negative for congestion, ear pain and tinnitus.   ?Eyes: Negative.  Negative for blurred vision and double vision.  ?Respiratory: Negative.  Negative for cough, sputum production and shortness of breath.   ?Cardiovascular: Negative.  Negative for chest pain, palpitations and leg swelling.  ?Gastrointestinal: Negative.  Negative for abdominal pain, constipation, diarrhea, nausea and vomiting.  ?Genitourinary:  Negative for dysuria, frequency and urgency.  ?Musculoskeletal:  Positive for falls and joint pain. Negative for back pain.  ?Skin: Negative.  Negative for rash.   ?Neurological:  Positive for headaches. Negative for weakness.  ?Endo/Heme/Allergies: Negative.  Does not bruise/bleed easily.  ?Psychiatric/Behavioral: Negative.  Negative for depression. The patient is not nervous/anxious and does not have insomnia.    ? ?MEDICAL HISTORY:  ?Past Medical History:  ?Diagnosis Date  ? Allergy   ? Hypercholesterolemia   ? Hypertension   ? Personal history of osteomyelitis   ? of the jaw- 2005-mandibular surgery  ? Polycythemia   ? Sleep apnea   ? ? ?SURGICAL HISTORY: ?Past Surgical History:  ?Procedure Laterality Date  ? CARDIOVERSION N/A 03/04/2021  ? Procedure: CARDIOVERSION;  Surgeon: Kate Sable, MD;  Location: [facility] ORS;  Service: Cardiovascular;  Laterality: N/A;  ? CATARACT EXTRACTION Right 11/2013  ? COLONOSCOPY  2006  ? COLONOSCOPY WITH PROPOFOL N/A 10/19/2017  ? Procedure: COLONOSCOPY WITH PROPOFOL;  Surgeon: [name], MD;  Location: [facility] ENDOSCOPY;  Service: Endoscopy;  Laterality: N/A;  ? INGUINAL HERNIA REPAIR Right 1972  ? MANDIBLE SURGERY  2005  ? [facility]  ? PELVIC FRACTURE SURGERY  10/2021  ? ? ?SOCIAL HISTORY: ?Social History  ? ?Socioeconomic History  ? Marital status: Married  ?  Spouse name: Not on file  ? Number of children: 2  ? Years of education: Not on file  ? Highest education level: Not on file  ?Occupational History  ? Occupation: Librarian, academic  ?Tobacco Use  ? Smoking status: Former  ?  Packs/day: 1.50  ?  Years: 20.00  ?  Pack years: 30.00  ?  Types: Cigarettes  ?  Quit date: 11/29/2015  ?  Years since quitting: 6.2  ? Smokeless tobacco: Never  ? Tobacco comments:  ?  quit June of 2008 after Chantix. he was exposed to secondary smoke  ?Vaping Use  ?  Vaping Use: Never used  ?Substance and Sexual Activity  ? Alcohol use: Yes  ?  Alcohol/week: 0.0 standard drinks  ?  Comment: occasional  ? Drug use: No  ? Sexual activity: Not on file  ?Other Topics Concern  ? Not on file  ?Social History Narrative  ? Not on file  ? ?Social Determinants of Health   ? ?Financial Resource Strain: Not on file  ?Food Insecurity: Not on file  ?Transportation Needs: Not on file  ?Physical Activity: Not on file  ?Stress: Not on file  ?Social Connections: Not on file  ?Intimate Partner Violence: Not on file  ? ? ?FAMILY HISTORY: ?Family History  ?Problem Relation Age of Onset  ? Diabetes Mother   ? Congestive Heart Failure Mother   ? Pancreatic cancer Father   ? Healthy Sister   ? Healthy Brother   ? Healthy Brother   ? Healthy Brother   ? ? ?ALLERGIES:  is allergic to penicillins. ? ?MEDICATIONS:  ?Current Outpatient Medications  ?Medication Sig Dispense Refill  ? apixaban (ELIQUIS) 5 MG TABS tablet Take 1 tablet (5 mg total) by mouth 2 (two) times daily. 180 tablet 3  ? Coenzyme Q10 50 MG CAPS Take 50 mg by mouth every evening. CO ENZYME Q-10, '50MG'$  (Oral Capsule)  1 po qd for 0 days  Quantity: 30.00;  Refills: 0   Ordered :31-Aug-2010  Margarita Rana MD;  Started 31-Mar-2009 Active Comments: DX: 272.0    ? lisinopril-hydrochlorothiazide (ZESTORETIC) 20-12.5 MG tablet Take 1 tablet by mouth 2 (two) times daily. 180 tablet 1  ? metoprolol tartrate (LOPRESSOR) 50 MG tablet Take 1 tablet (50 mg total) by mouth 2 (two) times daily. 180 tablet 3  ? OMEGA-3 FATTY ACIDS PO Take 1 capsule by mouth every evening. FISH-EPA, '1000MG'$  (Oral Capsule)  2 po bid for 0 days  Quantity: 0.00;  Refills: 0   Ordered :31-Aug-2010  Margarita Rana MD;  Started 19-June-2007 Active Comments: DX: 272.0    ? Polyethyl Glycol-Propyl Glycol (LUBRICANT EYE DROPS) 0.4-0.3 % SOLN Place 1-2 drops into both eyes 3 (three) times daily as needed (dry/irritated eyes).    ? sildenafil (VIAGRA) 50 MG tablet TAKE 1 TABLET BY MOUTH AS NEEDED FOR ERECTILE DYSFUNCTION GENERIC EQUIVALENT FOR VIAGRA 8 tablet 3  ? simvastatin (ZOCOR) 20 MG tablet Take 1 tablet (20 mg total) by mouth at bedtime. 90 tablet 3  ? tamsulosin (FLOMAX) 0.4 MG CAPS capsule TAKE 2 CAPSULES BY MOUTH DAILY GENERIC EQUIVALENT FOR FLOMAX 180 capsule 3  ?  meloxicam (MOBIC) 7.5 MG tablet TAKE 1 TABLET DAILY (Patient not taking: Reported on 01/20/2022) 90 tablet 1  ? VENTOLIN HFA 108 (90 Base) MCG/ACT inhaler Inhale into the lungs. (Patient not taking: Reported on 01/20/2022)    ? ?No current facility-administered medications for this visit.  ? ? ?  ?. ? ?PHYSICAL EXAMINATION: ?ECOG PERFORMANCE STATUS: 0 - Asymptomatic ? ?Vitals:  ? 02/16/22 1400  ?BP: 132/89  ?Pulse: 69  ?Resp: 18  ?Temp: (!) 96.9 ?F (36.1 ?C)  ? ?Filed Weights  ? 02/16/22 1400  ?Weight: 287 lb (130.2 kg)  ? ? ?Physical Exam ?Constitutional:   ?   Appearance: Normal appearance.  ?HENT:  ?   Head: Normocephalic and atraumatic.  ?Eyes:  ?   Pupils: Pupils are equal, round, and reactive to light.  ?Cardiovascular:  ?   Rate and Rhythm: Normal rate and regular rhythm.  ?   Heart sounds: Normal heart sounds. No murmur heard. ?Pulmonary:  ?  Effort: Pulmonary effort is normal.  ?   Breath sounds: Normal breath sounds. No wheezing.  ?Abdominal:  ?   General: Bowel sounds are normal. There is no distension.  ?   Palpations: Abdomen is soft.  ?   Tenderness: There is no abdominal tenderness.  ?Musculoskeletal:     ?   General: Normal range of motion.  ?   Cervical back: Normal range of motion.  ?Skin: ?   General: Skin is warm and dry.  ?   Findings: No rash.  ?Neurological:  ?   Mental Status: He is alert and oriented to person, place, and time.  ?   Gait: Gait is intact.  ?Psychiatric:     ?   Mood and Affect: Mood and affect normal.     ?   Cognition and Memory: Memory normal.     ?   Judgment: Judgment normal.  ? ? ? ?LABORATORY DATA:  ?I have reviewed the data as listed ?Lab Results  ?Component Value Date  ? WBC 9.2 02/16/2022  ? HGB 18.4 (H) 02/16/2022  ? HCT 58.5 (H) 02/16/2022  ? MCV 89.3 02/16/2022  ? PLT 160 02/16/2022  ? ?Recent Labs  ?  02/23/21 ?0759 02/16/22 ?1428  ?NA 138 135  ?K 4.2 4.2  ?CL 98 98  ?CO2 31 30  ?GLUCOSE 132* 109*  ?BUN 17 14  ?CREATININE 0.90 0.78  ?CALCIUM 9.5 9.4  ?GFRNONAA >60  >60  ?PROT  --  7.4  ?ALBUMIN  --  3.9  ?AST  --  20  ?ALT  --  19  ?ALKPHOS  --  76  ?BILITOT  --  0.7  ? ? ?RADIOGRAPHIC STUDIES: ?I have personally reviewed the radiological images as listed and agreed with the fin

## 2022-02-16 NOTE — Progress Notes (Signed)
Therapeutic phlebotomy performed in left wrist using 20g angiocath. 576m removed. Pt tolerated procedure well. Declined offer for oral hydration post procedure. Vitals stable at discharge.  ?

## 2022-02-23 ENCOUNTER — Encounter: Payer: Self-pay | Admitting: Cardiology

## 2022-02-26 DIAGNOSIS — R293 Abnormal posture: Secondary | ICD-10-CM | POA: Diagnosis not present

## 2022-02-26 DIAGNOSIS — M6281 Muscle weakness (generalized): Secondary | ICD-10-CM | POA: Diagnosis not present

## 2022-02-26 DIAGNOSIS — R262 Difficulty in walking, not elsewhere classified: Secondary | ICD-10-CM | POA: Diagnosis not present

## 2022-02-26 DIAGNOSIS — M25552 Pain in left hip: Secondary | ICD-10-CM | POA: Diagnosis not present

## 2022-03-12 DIAGNOSIS — M6281 Muscle weakness (generalized): Secondary | ICD-10-CM | POA: Diagnosis not present

## 2022-03-12 DIAGNOSIS — R262 Difficulty in walking, not elsewhere classified: Secondary | ICD-10-CM | POA: Diagnosis not present

## 2022-03-12 DIAGNOSIS — R293 Abnormal posture: Secondary | ICD-10-CM | POA: Diagnosis not present

## 2022-03-12 DIAGNOSIS — M25552 Pain in left hip: Secondary | ICD-10-CM | POA: Diagnosis not present

## 2022-03-19 ENCOUNTER — Telehealth: Payer: Self-pay

## 2022-03-19 ENCOUNTER — Inpatient Hospital Stay: Payer: BC Managed Care – PPO

## 2022-03-19 ENCOUNTER — Inpatient Hospital Stay: Payer: BC Managed Care – PPO | Attending: Oncology

## 2022-03-19 DIAGNOSIS — D751 Secondary polycythemia: Secondary | ICD-10-CM | POA: Diagnosis not present

## 2022-03-19 LAB — HEMOGLOBIN AND HEMATOCRIT, BLOOD
HCT: 56.3 % — ABNORMAL HIGH (ref 39.0–52.0)
Hemoglobin: 17.5 g/dL — ABNORMAL HIGH (ref 13.0–17.0)

## 2022-03-19 NOTE — Telephone Encounter (Signed)
Per secure chat from Jonnie Finner RN: pt is retiring from his job next week and he needs care transferred to [facility]. He lives in [place] and has only been coming to [place] bc of his job. he needs an actual referral, thank you. ? ? ?Phone: [phone] ?Fax: [phone] ? ?Reaching out to Dan Europe to see how I need to handle this. ?

## 2022-03-19 NOTE — Progress Notes (Signed)
Therapeutic phlebotomy performed in left wrist using 20g angiocath. 554m removed. Pt tolerated procedure well. Declined offer for PO hydration. Vital signs stable at discharge.  ?

## 2022-03-19 NOTE — Patient Instructions (Signed)

## 2022-03-22 ENCOUNTER — Other Ambulatory Visit: Payer: Self-pay

## 2022-03-22 ENCOUNTER — Encounter: Payer: Self-pay | Admitting: Oncology

## 2022-03-22 DIAGNOSIS — D751 Secondary polycythemia: Secondary | ICD-10-CM

## 2022-03-22 NOTE — Telephone Encounter (Signed)
Referral faxed

## 2022-03-25 ENCOUNTER — Encounter: Payer: Self-pay | Admitting: Internal Medicine

## 2022-04-08 ENCOUNTER — Encounter: Payer: Self-pay | Admitting: Internal Medicine

## 2022-04-09 ENCOUNTER — Other Ambulatory Visit: Payer: BC Managed Care – PPO

## 2022-04-09 ENCOUNTER — Other Ambulatory Visit: Payer: Self-pay | Admitting: *Deleted

## 2022-04-09 ENCOUNTER — Other Ambulatory Visit
Admission: RE | Admit: 2022-04-09 | Discharge: 2022-04-09 | Disposition: A | Discharge status: Home / Self Care | Payer: BC Managed Care – PPO | Attending: Cardiology | Admitting: Cardiology

## 2022-04-09 DIAGNOSIS — Z01812 Encounter for preprocedural laboratory examination: Secondary | ICD-10-CM

## 2022-04-09 DIAGNOSIS — I4819 Other persistent atrial fibrillation: Secondary | ICD-10-CM | POA: Insufficient documentation

## 2022-04-09 DIAGNOSIS — I5020 Unspecified systolic (congestive) heart failure: Secondary | ICD-10-CM | POA: Insufficient documentation

## 2022-04-09 LAB — CBC WITH DIFFERENTIAL/PLATELET
Abs Immature Granulocytes: 0.15 10*3/uL — ABNORMAL HIGH (ref 0.00–0.07)
Basophils Absolute: 0.1 10*3/uL (ref 0.0–0.1)
Basophils Relative: 1 %
Eosinophils Absolute: 0.2 10*3/uL (ref 0.0–0.5)
Eosinophils Relative: 2 %
HCT: 57 % — ABNORMAL HIGH (ref 39.0–52.0)
Hemoglobin: 17 g/dL (ref 13.0–17.0)
Immature Granulocytes: 1 %
Lymphocytes Relative: 16 %
Lymphs Abs: 1.6 10*3/uL (ref 0.7–4.0)
MCH: 26.2 pg (ref 26.0–34.0)
MCHC: 29.8 g/dL — ABNORMAL LOW (ref 30.0–36.0)
MCV: 87.7 fL (ref 80.0–100.0)
Monocytes Absolute: 0.9 10*3/uL (ref 0.1–1.0)
Monocytes Relative: 9 %
Neutro Abs: 7.4 10*3/uL (ref 1.7–7.7)
Neutrophils Relative %: 71 %
Platelets: 159 10*3/uL (ref 150–400)
RBC: 6.5 MIL/uL — ABNORMAL HIGH (ref 4.22–5.81)
RDW: 15.4 % (ref 11.5–15.5)
WBC: 10.4 10*3/uL (ref 4.0–10.5)
nRBC: 0 % (ref 0.0–0.2)

## 2022-04-09 LAB — BASIC METABOLIC PANEL
Anion gap: 7 (ref 5–15)
BUN: 15 mg/dL (ref 8–23)
CO2: 31 mmol/L (ref 22–32)
Calcium: 9.4 mg/dL (ref 8.9–10.3)
Chloride: 99 mmol/L (ref 98–111)
Creatinine, Ser: 0.97 mg/dL (ref 0.61–1.24)
GFR, Estimated: >60 mL/min (ref >60)
Glucose, Bld: 105 mg/dL — ABNORMAL HIGH (ref 70–99)
Potassium: 4.9 mmol/L (ref 3.5–5.1)
Sodium: 137 mmol/L (ref 135–145)

## 2022-04-16 ENCOUNTER — Other Ambulatory Visit: Payer: Self-pay

## 2022-04-16 MED ORDER — LISINOPRIL-HYDROCHLOROTHIAZIDE 20-12.5 MG PO TABS: 1.0000 | ORAL_TABLET | Freq: Two times a day (BID) | ORAL | 0 refills | Status: DC

## 2022-04-20 ENCOUNTER — Encounter: Payer: Self-pay | Admitting: Internal Medicine

## 2022-04-20 ENCOUNTER — Inpatient Hospital Stay: Payer: BC Managed Care – PPO

## 2022-04-20 DIAGNOSIS — D751 Secondary polycythemia: Secondary | ICD-10-CM | POA: Diagnosis not present

## 2022-04-21 ENCOUNTER — Telehealth (HOSPITAL_COMMUNITY): Payer: Self-pay | Admitting: Emergency Medicine

## 2022-04-21 NOTE — Telephone Encounter (Signed)
Reaching out to patient to offer assistance regarding upcoming cardiac imaging study; pt verbalizes understanding of appt date/time, parking situation and where to check in, pre-test NPO status and medications ordered, and verified current allergies; name and call back number provided for further questions should they arise Marchia Bond RN [facility] [phone] office [phone] cell  1045 arrival Taking metoprolol 2 hr prior  Denies iv issues

## 2022-04-22 ENCOUNTER — Ambulatory Visit
Admission: RE | Admit: 2022-04-22 | Discharge: 2022-04-22 | Disposition: A | Discharge status: Home / Self Care | Payer: BC Managed Care – PPO | Source: Ambulatory Visit | Attending: Family Medicine | Admitting: Family Medicine

## 2022-04-22 DIAGNOSIS — I4891 Unspecified atrial fibrillation: Secondary | ICD-10-CM | POA: Diagnosis not present

## 2022-04-22 MED ORDER — IOHEXOL 350 MG/ML SOLN
100.0000 mL | Freq: Once | INTRAVENOUS | Status: AC | PRN
  Administered 2022-04-22: 100 mL via INTRAVENOUS

## 2022-04-23 DIAGNOSIS — F17210 Nicotine dependence, cigarettes, uncomplicated: Secondary | ICD-10-CM | POA: Diagnosis not present

## 2022-04-23 DIAGNOSIS — Z9989 Dependence on other enabling machines and devices: Secondary | ICD-10-CM | POA: Diagnosis not present

## 2022-04-23 DIAGNOSIS — D751 Secondary polycythemia: Secondary | ICD-10-CM | POA: Diagnosis not present

## 2022-04-23 DIAGNOSIS — G4730 Sleep apnea, unspecified: Secondary | ICD-10-CM | POA: Diagnosis not present

## 2022-04-27 ENCOUNTER — Encounter: Payer: Self-pay | Admitting: Oncology

## 2022-04-28 NOTE — Pre-Procedure Instructions (Signed)
Instructed patient on the following items: Arrival time 0530 Nothing to eat or drink after midnight No meds AM of procedure Responsible person to drive you home and stay with you for 24 hrs  Have you missed any doses of anti-coagulant Eliquis- hasn't missed any doses    

## 2022-04-29 ENCOUNTER — Encounter (HOSPITAL_COMMUNITY): Payer: Self-pay | Admitting: Cardiology

## 2022-04-29 ENCOUNTER — Encounter (HOSPITAL_COMMUNITY)
Admission: RE | Disposition: A | Discharge status: Home / Self Care | Payer: BC Managed Care – PPO | Source: Home / Self Care | Attending: Cardiology

## 2022-04-29 ENCOUNTER — Other Ambulatory Visit: Payer: Self-pay

## 2022-04-29 ENCOUNTER — Ambulatory Visit (HOSPITAL_COMMUNITY)
Admission: RE | Admit: 2022-04-29 | Discharge: 2022-04-30 | Disposition: A | Discharge status: Home / Self Care | Payer: BC Managed Care – PPO | Attending: Cardiology | Admitting: Cardiology

## 2022-04-29 ENCOUNTER — Ambulatory Visit (HOSPITAL_COMMUNITY): Payer: BC Managed Care – PPO | Admitting: Anesthesiology

## 2022-04-29 DIAGNOSIS — E785 Hyperlipidemia, unspecified: Secondary | ICD-10-CM | POA: Insufficient documentation

## 2022-04-29 DIAGNOSIS — I5020 Unspecified systolic (congestive) heart failure: Secondary | ICD-10-CM | POA: Insufficient documentation

## 2022-04-29 DIAGNOSIS — I110 Hypertensive heart disease with heart failure: Secondary | ICD-10-CM | POA: Insufficient documentation

## 2022-04-29 DIAGNOSIS — R319 Hematuria, unspecified: Secondary | ICD-10-CM | POA: Diagnosis not present

## 2022-04-29 DIAGNOSIS — Z7722 Contact with and (suspected) exposure to environmental tobacco smoke (acute) (chronic): Secondary | ICD-10-CM | POA: Insufficient documentation

## 2022-04-29 DIAGNOSIS — I4891 Unspecified atrial fibrillation: Secondary | ICD-10-CM | POA: Diagnosis not present

## 2022-04-29 DIAGNOSIS — N401 Enlarged prostate with lower urinary tract symptoms: Secondary | ICD-10-CM | POA: Diagnosis not present

## 2022-04-29 DIAGNOSIS — Z6839 Body mass index (BMI) 39.0-39.9, adult: Secondary | ICD-10-CM | POA: Diagnosis not present

## 2022-04-29 DIAGNOSIS — G4733 Obstructive sleep apnea (adult) (pediatric): Secondary | ICD-10-CM | POA: Insufficient documentation

## 2022-04-29 DIAGNOSIS — I4819 Other persistent atrial fibrillation: Secondary | ICD-10-CM | POA: Insufficient documentation

## 2022-04-29 DIAGNOSIS — Z87891 Personal history of nicotine dependence: Secondary | ICD-10-CM | POA: Insufficient documentation

## 2022-04-29 DIAGNOSIS — R338 Other retention of urine: Secondary | ICD-10-CM | POA: Insufficient documentation

## 2022-04-29 HISTORY — PX: ATRIAL FIBRILLATION ABLATION: EP1191

## 2022-04-29 LAB — POCT ACTIVATED CLOTTING TIME
Activated Clotting Time: 305 seconds
Activated Clotting Time: 323 seconds
Activated Clotting Time: 329 seconds

## 2022-04-29 SURGERY — ATRIAL FIBRILLATION ABLATION
Anesthesia: General

## 2022-04-29 MED ORDER — ACETAMINOPHEN 325 MG PO TABS: 650.0000 mg | ORAL_TABLET | ORAL | Status: DC | PRN

## 2022-04-29 MED ORDER — LIDOCAINE 2% (20 MG/ML) 5 ML SYRINGE
INTRAMUSCULAR | Status: DC | PRN
  Administered 2022-04-29: 80 mg via INTRAVENOUS

## 2022-04-29 MED ORDER — POLYETHYL GLYCOL-PROPYL GLYCOL 0.4-0.3 % OP SOLN: 1.0000 [drp] | Freq: Three times a day (TID) | OPHTHALMIC | Status: DC | PRN

## 2022-04-29 MED ORDER — ISOPROTERENOL HCL 0.2 MG/ML IJ SOLN
INTRAVENOUS | Status: DC | PRN
  Administered 2022-04-29: 4 ug/min via INTRAVENOUS

## 2022-04-29 MED ORDER — COLCHICINE 0.6 MG PO TABS: 0.6000 mg | ORAL_TABLET | Freq: Two times a day (BID) | ORAL | 0 refills | Status: DC

## 2022-04-29 MED ORDER — ROCURONIUM BROMIDE 10 MG/ML (PF) SYRINGE
PREFILLED_SYRINGE | INTRAVENOUS | Status: DC | PRN
  Administered 2022-04-29: 70 mg via INTRAVENOUS

## 2022-04-29 MED ORDER — PHENYLEPHRINE HCL-NACL 20-0.9 MG/250ML-% IV SOLN
INTRAVENOUS | Status: DC | PRN
  Administered 2022-04-29: 50 ug/min via INTRAVENOUS

## 2022-04-29 MED ORDER — COENZYME Q10 50 MG PO CAPS: 50.0000 mg | ORAL_CAPSULE | Freq: Every evening | ORAL | Status: DC

## 2022-04-29 MED ORDER — HEPARIN SODIUM (PORCINE) 1000 UNIT/ML IJ SOLN
INTRAMUSCULAR | Status: DC | PRN
  Administered 2022-04-29: 1000 [IU] via INTRAVENOUS

## 2022-04-29 MED ORDER — SIMVASTATIN 20 MG PO TABS
20.0000 mg | ORAL_TABLET | Freq: Every day | ORAL | Status: DC
  Administered 2022-04-29: 20 mg via ORAL
  Filled 2022-04-29: qty 1

## 2022-04-29 MED ORDER — ALBUTEROL SULFATE HFA 108 (90 BASE) MCG/ACT IN AERS
INHALATION_SPRAY | RESPIRATORY_TRACT | Status: DC | PRN
  Administered 2022-04-29: 6 via RESPIRATORY_TRACT

## 2022-04-29 MED ORDER — ALBUTEROL SULFATE (2.5 MG/3ML) 0.083% IN NEBU: 3.0000 mL | INHALATION_SOLUTION | RESPIRATORY_TRACT | Status: DC | PRN

## 2022-04-29 MED ORDER — EPHEDRINE SULFATE (PRESSORS) 50 MG/ML IJ SOLN
INTRAMUSCULAR | Status: DC | PRN
  Administered 2022-04-29: 5 mg via INTRAVENOUS

## 2022-04-29 MED ORDER — SODIUM CHLORIDE 0.9% FLUSH
3.0000 mL | Freq: Two times a day (BID) | INTRAVENOUS | Status: DC
  Administered 2022-04-29 (×2): 3 mL via INTRAVENOUS

## 2022-04-29 MED ORDER — FUROSEMIDE 10 MG/ML IJ SOLN
INTRAMUSCULAR | Status: AC
  Filled 2022-04-29: qty 4

## 2022-04-29 MED ORDER — SUGAMMADEX SODIUM 200 MG/2ML IV SOLN
INTRAVENOUS | Status: DC | PRN
  Administered 2022-04-29: 350 mg via INTRAVENOUS

## 2022-04-29 MED ORDER — SODIUM CHLORIDE 0.9 % IV SOLN: INTRAVENOUS | Status: DC

## 2022-04-29 MED ORDER — HEPARIN (PORCINE) IN NACL 2-0.9 UNITS/ML
INTRAMUSCULAR | Status: AC | PRN
  Administered 2022-04-29 (×4): 500 mL

## 2022-04-29 MED ORDER — ALBUMIN HUMAN 5 % IV SOLN: INTRAVENOUS | Status: DC | PRN

## 2022-04-29 MED ORDER — ACETAMINOPHEN 500 MG PO TABS
1000.0000 mg | ORAL_TABLET | Freq: Once | ORAL | Status: AC
  Administered 2022-04-29: 1000 mg via ORAL
  Filled 2022-04-29: qty 2

## 2022-04-29 MED ORDER — ISOPROTERENOL HCL 0.2 MG/ML IJ SOLN
INTRAMUSCULAR | Status: AC
  Filled 2022-04-29: qty 5

## 2022-04-29 MED ORDER — FENTANYL CITRATE (PF) 250 MCG/5ML IJ SOLN
INTRAMUSCULAR | Status: DC | PRN
  Administered 2022-04-29: 50 ug via INTRAVENOUS

## 2022-04-29 MED ORDER — COLCHICINE 0.6 MG PO TABS
0.6000 mg | ORAL_TABLET | Freq: Two times a day (BID) | ORAL | Status: DC
Start: 2022-04-29 — End: 2022-04-30
  Administered 2022-04-29 – 2022-04-30 (×3): 0.6 mg via ORAL
  Filled 2022-04-29 (×3): qty 1

## 2022-04-29 MED ORDER — APIXABAN 5 MG PO TABS
5.0000 mg | ORAL_TABLET | Freq: Two times a day (BID) | ORAL | Status: DC
Start: 2022-04-30 — End: 2022-04-30
  Administered 2022-04-30 (×2): 5 mg via ORAL
  Filled 2022-04-29 (×3): qty 1

## 2022-04-29 MED ORDER — SODIUM CHLORIDE 0.9% FLUSH
3.0000 mL | INTRAVENOUS | Status: DC | PRN
Start: 2022-04-29 — End: 2022-04-30

## 2022-04-29 MED ORDER — PHENYLEPHRINE 80 MCG/ML (10ML) SYRINGE FOR IV PUSH (FOR BLOOD PRESSURE SUPPORT)
PREFILLED_SYRINGE | INTRAVENOUS | Status: DC | PRN
  Administered 2022-04-29: 80 ug via INTRAVENOUS
  Administered 2022-04-29: 160 ug via INTRAVENOUS
  Administered 2022-04-29 (×2): 240 ug via INTRAVENOUS
  Administered 2022-04-29: 80 ug via INTRAVENOUS

## 2022-04-29 MED ORDER — APIXABAN 5 MG PO TABS
5.0000 mg | ORAL_TABLET | Freq: Two times a day (BID) | ORAL | Status: DC
  Administered 2022-04-29: 5 mg via ORAL
  Filled 2022-04-29: qty 1

## 2022-04-29 MED ORDER — DEXAMETHASONE SODIUM PHOSPHATE 10 MG/ML IJ SOLN
INTRAMUSCULAR | Status: DC | PRN
  Administered 2022-04-29: 8 mg via INTRAVENOUS

## 2022-04-29 MED ORDER — MIDAZOLAM HCL 2 MG/2ML IJ SOLN
INTRAMUSCULAR | Status: DC | PRN
  Administered 2022-04-29: 1 mg via INTRAVENOUS

## 2022-04-29 MED ORDER — FENTANYL CITRATE (PF) 100 MCG/2ML IJ SOLN
INTRAMUSCULAR | Status: AC
  Filled 2022-04-29: qty 2

## 2022-04-29 MED ORDER — PROTAMINE SULFATE 10 MG/ML IV SOLN
INTRAVENOUS | Status: DC | PRN
  Administered 2022-04-29: 35 mg via INTRAVENOUS

## 2022-04-29 MED ORDER — HEPARIN SODIUM (PORCINE) 1000 UNIT/ML IJ SOLN
INTRAMUSCULAR | Status: AC
  Filled 2022-04-29: qty 10

## 2022-04-29 MED ORDER — HYDROCHLOROTHIAZIDE 12.5 MG PO TABS
12.5000 mg | ORAL_TABLET | Freq: Two times a day (BID) | ORAL | Status: DC
  Administered 2022-04-29 – 2022-04-30 (×2): 12.5 mg via ORAL
  Filled 2022-04-29 (×2): qty 1

## 2022-04-29 MED ORDER — LISINOPRIL 20 MG PO TABS
20.0000 mg | ORAL_TABLET | Freq: Two times a day (BID) | ORAL | Status: DC
  Administered 2022-04-29 – 2022-04-30 (×2): 20 mg via ORAL
  Filled 2022-04-29 (×2): qty 1

## 2022-04-29 MED ORDER — ONDANSETRON HCL 4 MG/2ML IJ SOLN
INTRAMUSCULAR | Status: DC | PRN
  Administered 2022-04-29: 4 mg via INTRAVENOUS

## 2022-04-29 MED ORDER — TAMSULOSIN HCL 0.4 MG PO CAPS
0.4000 mg | ORAL_CAPSULE | Freq: Two times a day (BID) | ORAL | Status: DC
  Administered 2022-04-29 – 2022-04-30 (×2): 0.4 mg via ORAL
  Filled 2022-04-29 (×2): qty 1

## 2022-04-29 MED ORDER — MIDAZOLAM HCL 2 MG/2ML IJ SOLN
INTRAMUSCULAR | Status: AC
  Filled 2022-04-29: qty 2

## 2022-04-29 MED ORDER — METOPROLOL TARTRATE 50 MG PO TABS
50.0000 mg | ORAL_TABLET | Freq: Two times a day (BID) | ORAL | Status: DC
Start: 2022-04-29 — End: 2022-04-30
  Administered 2022-04-29 – 2022-04-30 (×2): 50 mg via ORAL
  Filled 2022-04-29 (×2): qty 1

## 2022-04-29 MED ORDER — FUROSEMIDE 10 MG/ML IJ SOLN
20.0000 mg | Freq: Once | INTRAMUSCULAR | Status: AC
  Administered 2022-04-29: 20 mg via INTRAVENOUS

## 2022-04-29 MED ORDER — SODIUM CHLORIDE 0.9 % IV SOLN
250.0000 mL | INTRAVENOUS | Status: DC | PRN
Start: 2022-04-29 — End: 2022-04-30

## 2022-04-29 MED ORDER — LISINOPRIL-HYDROCHLOROTHIAZIDE 20-12.5 MG PO TABS: 1.0000 | ORAL_TABLET | Freq: Two times a day (BID) | ORAL | Status: DC

## 2022-04-29 MED ORDER — ONDANSETRON HCL 4 MG/2ML IJ SOLN: 4.0000 mg | Freq: Four times a day (QID) | INTRAMUSCULAR | Status: DC | PRN

## 2022-04-29 MED ORDER — IPRATROPIUM-ALBUTEROL 0.5-2.5 (3) MG/3ML IN SOLN: 3.0000 mL | RESPIRATORY_TRACT | Status: DC | PRN

## 2022-04-29 MED ORDER — PANTOPRAZOLE SODIUM 40 MG PO TBEC: 40.0000 mg | DELAYED_RELEASE_TABLET | Freq: Every day | ORAL | 0 refills | Status: DC

## 2022-04-29 MED ORDER — IPRATROPIUM-ALBUTEROL 0.5-2.5 (3) MG/3ML IN SOLN
3.0000 mL | Freq: Once | RESPIRATORY_TRACT | Status: AC
  Administered 2022-04-29: 3 mL via RESPIRATORY_TRACT
  Filled 2022-04-29: qty 3

## 2022-04-29 MED ORDER — PROPOFOL 10 MG/ML IV BOLUS
INTRAVENOUS | Status: DC | PRN
  Administered 2022-04-29: 200 mg via INTRAVENOUS

## 2022-04-29 MED ORDER — HEPARIN SODIUM (PORCINE) 1000 UNIT/ML IJ SOLN
INTRAMUSCULAR | Status: DC | PRN
  Administered 2022-04-29: 5000 [IU] via INTRAVENOUS
  Administered 2022-04-29: 3000 [IU] via INTRAVENOUS
  Administered 2022-04-29: 18000 [IU] via INTRAVENOUS

## 2022-04-29 MED ORDER — PANTOPRAZOLE SODIUM 40 MG PO TBEC
40.0000 mg | DELAYED_RELEASE_TABLET | Freq: Every day | ORAL | Status: DC
  Administered 2022-04-29 – 2022-04-30 (×2): 40 mg via ORAL
  Filled 2022-04-29 (×2): qty 1

## 2022-04-29 MED ORDER — POLYVINYL ALCOHOL 1.4 % OP SOLN: 1.0000 [drp] | Freq: Three times a day (TID) | OPHTHALMIC | Status: DC | PRN

## 2022-04-29 SURGICAL SUPPLY — 19 items
BLANKET WARM UNDERBOD FULL ACC (MISCELLANEOUS) ×2 IMPLANT
CATH OCTARAY 2.0 F 3-3-3-3-3 (CATHETERS) ×1 IMPLANT
CATH S CIRCA THERM PROBE 10F (CATHETERS) ×1 IMPLANT
CATH SMTCH THERMOCOOL SF DF (CATHETERS) ×1 IMPLANT
CATH SOUNDSTAR ECO 8FR (CATHETERS) ×1 IMPLANT
CATH WEBSTER BI DIR CS D-F CRV (CATHETERS) ×1 IMPLANT
CLOSURE PERCLOSE PROSTYLE (VASCULAR PRODUCTS) ×3 IMPLANT
COVER SWIFTLINK CONNECTOR (BAG) ×2 IMPLANT
MAT PREVALON FULL STRYKER (MISCELLANEOUS) ×1 IMPLANT
PACK EP LATEX FREE (CUSTOM PROCEDURE TRAY) ×1
PACK EP LF (CUSTOM PROCEDURE TRAY) ×1 IMPLANT
PAD DEFIB RADIO PHYSIO CONN (PAD) ×2 IMPLANT
PATCH CARTO3 (PAD) ×1 IMPLANT
SHEATH BAYLIS TRANSSEPTAL 98CM (NEEDLE) ×1 IMPLANT
SHEATH CARTO VIZIGO SM CVD (SHEATH) ×1 IMPLANT
SHEATH PINNACLE 8F 10CM (SHEATH) ×2 IMPLANT
SHEATH PINNACLE 9F 10CM (SHEATH) ×1 IMPLANT
SHEATH PROBE COVER 6X72 (BAG) ×1 IMPLANT
TUBING SMART ABLATE COOLFLOW (TUBING) ×1 IMPLANT

## 2022-04-29 NOTE — Progress Notes (Signed)
Patient unable to void. Attempted to place I and o cath, but met resistance. I stopped and had Sherlyn Lick to step in w/sterile gloves and try but met resistance. Upon removing catheter, small amount dark red blood in tubing. No c/o pain. HOB elevated 30 degrees; patient trying to void.

## 2022-04-29 NOTE — Progress Notes (Signed)
Dr Quentin Ore in to see pt-report has been called to [facility]

## 2022-04-29 NOTE — Progress Notes (Signed)
Dr. Quentin Ore notified of BP, patient remains on O2; doctor by to see patient.

## 2022-04-29 NOTE — Anesthesia Procedure Notes (Signed)
Procedure Name: Intubation Date/Time: 04/29/2022 7:41 AM Performed by: Inda Coke, CRNA Pre-anesthesia Checklist: Patient identified, Emergency Drugs available, Suction available and Patient being monitored Patient Re-evaluated:Patient Re-evaluated prior to induction Oxygen Delivery Method: Circle System Utilized Preoxygenation: Pre-oxygenation with 100% oxygen Induction Type: IV induction Ventilation: Mask ventilation without difficulty Laryngoscope Size: Mac and 4 Grade View: Grade I Tube type: Oral Tube size: 7.5 mm Number of attempts: 1 Airway Equipment and Method: Stylet and Oral airway Placement Confirmation: ETT inserted through vocal cords under direct vision, positive ETCO2 and breath sounds checked- equal and bilateral Secured at: 23 cm Tube secured with: Tape Dental Injury: Teeth and Oropharynx as per pre-operative assessment

## 2022-04-29 NOTE — H&P (Signed)
Electrophysiology Office Note:     Date:  04/29/2022    ID:  Jason Bolton, DOB 04/06/1956, MRN [id number]   PCP:  Jason Bolton, [facility] Cardiologist:  Jason Sable, MD  [facility] Electrophysiologist:  Jason Epley, MD    Referring MD: Jason Sable, MD    Chief Complaint: AF   History of Present Illness:     Jason Bolton is a 66 y.o. male who presents for an evaluation of AF at the request of Dr Jason Bolton. Their medical history includes HTN, HLD, OSA on CPAP, prior tobacco use and persistent AF. He previously saw Dr Jason Bolton 10/15/2021. He was previously cardioverted in 02/2021. At that appointment he was back in AF. The patient has a mildly reduced EF and because of this he was referred to consider rhythm control options.    Prior to his cardioversion, the patient reported fatigue and significant lower extremity swelling associate with his atrial fibrillation.  This improved after his cardioversion.  At the time of his most recent follow-up with Dr. Garen Bolton he was back in atrial fibrillation although he does continue to feel better than he was before the cardioversion.  He takes Eliquis for stroke prophylaxis.  He is interested in avoiding exposure to antiarrhythmic drug therapy.  I did discuss dofetilide and amiodarone during today's visit.  He is recovering after a fall from a ladder.  He was actually using a walker for several months as he regained the use of his left leg.  He is now back to walking without a walker.  He is ambulating.  He goes grocery shopping without assistance.  He is very motivated.  He is interested in weight loss.  He does have sleep apnea and uses CPAP nightly.   Doing well this AM. Planning for PVI today.      Objective        Past Medical History:  Diagnosis Date   Allergy     Hypercholesterolemia     Hypertension     Personal history of osteomyelitis      of the jaw- 2005-mandibular surgery    Polycythemia     Sleep apnea             Past Surgical History:  Procedure Laterality Date   CARDIOVERSION N/A 03/04/2021    Procedure: CARDIOVERSION;  Surgeon: Jason Sable, MD;  Location: [facility] ORS;  Service: Cardiovascular;  Laterality: N/A;   CATARACT EXTRACTION Right 11/2013   COLONOSCOPY   2006   COLONOSCOPY WITH PROPOFOL N/A 10/19/2017    Procedure: COLONOSCOPY WITH PROPOFOL;  Surgeon: Jason Bellow, MD;  Location: [facility] ENDOSCOPY;  Service: Endoscopy;  Laterality: N/A;   INGUINAL HERNIA REPAIR Right 1972   MANDIBLE SURGERY   2005    [facility]   PELVIC FRACTURE SURGERY   10/2021      Current Medications: Active Medications      Current Meds  Medication Sig   apixaban (ELIQUIS) 5 MG TABS tablet Take 1 tablet (5 mg total) by mouth 2 (two) times daily.   Coenzyme Q10 50 MG CAPS Take 50 mg by mouth every evening. CO ENZYME Q-10, '50MG'$  (Oral Capsule)  1 po qd for 0 days  Quantity: 30.00;  Refills: 0   Ordered :31-Aug-2010  Jason Rana MD;  Started 31-Mar-2009 Active Comments: DX: 272.0   lisinopril-hydrochlorothiazide (ZESTORETIC) 20-12.5 MG tablet Take 1 tablet by mouth 2 (two) times daily.   metoprolol tartrate (LOPRESSOR) 50 MG tablet Take  1 tablet (50 mg total) by mouth 2 (two) times daily.   OMEGA-3 FATTY ACIDS PO Take 1 capsule by mouth every evening. FISH-EPA, '1000MG'$  (Oral Capsule)  2 po bid for 0 days  Quantity: 0.00;  Refills: 0   Ordered :31-Aug-2010  Jason Rana MD;  Started 19-June-2007 Active Comments: DX: 272.0   Polyethyl Glycol-Propyl Glycol (LUBRICANT EYE DROPS) 0.4-0.3 % SOLN Place 1-2 drops into both eyes 3 (three) times daily as needed (dry/irritated eyes).   sildenafil (VIAGRA) 50 MG tablet TAKE 1 TABLET BY MOUTH AS NEEDED FOR ERECTILE DYSFUNCTION GENERIC EQUIVALENT FOR VIAGRA   simvastatin (ZOCOR) 20 MG tablet Take 1 tablet (20 mg total) by mouth at bedtime.   tamsulosin (FLOMAX) 0.4 MG CAPS capsule TAKE 2 CAPSULES BY MOUTH DAILY GENERIC EQUIVALENT FOR  FLOMAX        Allergies:   Penicillins    Social History         Socioeconomic History   Marital status: Married      Spouse name: Not on file   Number of children: 2   Years of education: Not on file   Highest education level: Not on file  Occupational History   Occupation: supervisor  Tobacco Use   Smoking status: Former      Packs/day: 1.50      Years: 20.00      Pack years: 30.00      Types: Cigarettes      Quit date: 11/29/2015      Years since quitting: 6.1   Smokeless tobacco: Never   Tobacco comments:      quit June of 2008 after [employer]. he was exposed to secondary smoke  Vaping Use   Vaping Use: Never used  Substance and Sexual Activity   Alcohol use: Yes      Alcohol/week: 0.0 standard drinks      Comment: occasional   Drug use: No   Sexual activity: Not on file  Other Topics Concern   Not on file  Social History Narrative   Not on file    Social Determinants of Health    Financial Resource Strain: Not on file  Food Insecurity: Not on file  Transportation Needs: Not on file  Physical Activity: Not on file  Stress: Not on file  Social Connections: Not on file      Family History: The patient's family history includes Congestive Heart Failure in his mother; Diabetes in his mother; Healthy in his brother, brother, brother, and sister; Pancreatic cancer in his father.   ROS:   Please see the history of present illness.    All other systems reviewed and are negative.   EKGs/Labs/Other Studies Reviewed:     The following studies were reviewed today:   02/03/2021 Echo EF 45% RV mildly reduced RA severely dilated LA mildly dilated LV mildly dilated     EKG:  The ekg ordered today demonstrates atrial fibrillation with a ventricular rate of 69 bpm.     Recent Labs: 02/16/2021: B Natriuretic Peptide 256.3; Magnesium 1.8 02/23/2021: BUN 17; Creatinine, Ser 0.90; Potassium 4.2; Sodium 138 05/06/2021: Platelets 129 08/18/2021: Hemoglobin 15.1   Recent Lipid Panel Labs (Brief)          Component Value Date/Time    CHOL 122 01/14/2021 0913    TRIG 73 01/14/2021 0913    HDL 35 (L) 01/14/2021 0913    CHOLHDL 3.5 01/14/2021 0913    LDLCALC 72 01/14/2021 0913        Physical  Exam:     VS:  BP 118/72 (BP Location: Left Arm, Patient Position: Sitting, Cuff Size: Normal)   Pulse 74 Ht '6\' 1"'$  (1.854 m)   Wt 286 lb (129.7 kg)   SpO2 94%   BMI 37.73 kg/m         Wt Readings from Last 3 Encounters:  01/20/22 286 lb (129.7 kg)  10/15/21 282 lb (127.9 kg)  09/10/21 287 lb 4.8 oz (130.3 kg)      GEN:  Well nourished, well developed in no acute distress.  Obese HEENT: Normal NECK: No JVD; No carotid bruits LYMPHATICS: No lymphadenopathy CARDIAC: Irregularly irregular, no murmurs, rubs, gallops RESPIRATORY:  Clear to auscultation without rales, wheezing or rhonchi  ABDOMEN: Soft, non-tender, non-distended MUSCULOSKELETAL:  No edema; No deformity  SKIN: Warm and dry NEUROLOGIC:  Alert and oriented x 3 PSYCHIATRIC:  Normal affect          Assessment     ASSESSMENT:     1. Persistent atrial fibrillation (HCC)   2. HFrEF (heart failure with reduced ejection fraction) ([address])   3. Primary hypertension     PLAN:     In order of problems listed above:   #Persistent atrial fibrillation With evidence of heart failure in the past and a mildly reduced left ventricular function.  I do think a rhythm control strategy is indicated.  I discussed the possibility of using antiarrhythmic drugs versus catheter ablation to achieve this.  He would like to pursue catheter ablation which I think is a reasonable for step.  I did discuss the possibility of needing an antiarrhythmic drug or repeat ablation depending on his results after this procedure.  I discussed the procedure in detail with the patient including the risk, recovery and likelihood of success.  He would need an echo prior to the procedure.  Risk, benefits, and alternatives  to EP study and radiofrequency ablation for afib were also discussed in detail today. These risks include but are not limited to stroke, bleeding, vascular damage, tamponade, perforation, damage to the esophagus, lungs, and other structures, pulmonary vein stenosis, worsening renal function, and death. The patient understands these risk and wishes to proceed.  We will therefore proceed with catheter ablation at the next available time.  Carto, ICE, anesthesia are requested for the procedure.  Will also obtain CT PV protocol prior to the procedure to exclude LAA thrombus and further evaluate atrial anatomy.   Plan for PVI today.    Signed, Hilton Cork. Quentin Ore, MD, [facility], [facility] 04/29/2022 Electrophysiology [facility]

## 2022-04-29 NOTE — Anesthesia Preprocedure Evaluation (Signed)
Anesthesia Evaluation  Patient identified by MRN, date of birth, ID band Patient awake    Reviewed: Allergy & Precautions, NPO status , Patient's Chart, lab work & pertinent test results  Airway Mallampati: II  TM Distance: >3 FB Neck ROM: Full    Dental  (+) Dental Advisory Given   Pulmonary sleep apnea , former smoker,    breath sounds clear to auscultation       Cardiovascular hypertension, Pt. on medications and Pt. on home beta blockers + dysrhythmias Atrial Fibrillation  Rhythm:Regular Rate:Normal     Neuro/Psych negative neurological ROS     GI/Hepatic negative GI ROS, Neg liver ROS,   Endo/Other  negative endocrine ROS  Renal/GU negative Renal ROS     Musculoskeletal   Abdominal   Peds  Hematology negative hematology ROS (+)   Anesthesia Other Findings   Reproductive/Obstetrics                             Anesthesia Physical Anesthesia Plan  ASA: 2  Anesthesia Plan: General   Post-op Pain Management: Tylenol PO (pre-op)* and Minimal or no pain anticipated   Induction: Intravenous  PONV Risk Score and Plan: 2 and Dexamethasone, Ondansetron and Treatment may vary due to age or medical condition  Airway Management Planned: Oral ETT  Additional Equipment: None  Intra-op Plan:   Post-operative Plan: Extubation in OR  Informed Consent: I have reviewed the patients History and Physical, chart, labs and discussed the procedure including the risks, benefits and alternatives for the proposed anesthesia with the patient or authorized representative who has indicated his/her understanding and acceptance.     Dental advisory given  Plan Discussed with: CRNA  Anesthesia Plan Comments:         Anesthesia Quick Evaluation

## 2022-04-29 NOTE — Progress Notes (Signed)
Pt placed on O2 2L [place] due to sats mid-high 80s

## 2022-04-29 NOTE — Transfer of Care (Signed)
Immediate Anesthesia Transfer of Care Note  Patient: Jason Bolton  Procedure(s) Performed: ATRIAL FIBRILLATION ABLATION  Patient Location: Cath Lab  Anesthesia Type:General  Level of Consciousness: awake and alert   Airway & Oxygen Therapy: Patient Spontanous Breathing and Patient connected to face mask oxygen  Post-op Assessment: Report given to RN and Post -op Vital signs reviewed and stable  Post vital signs: Reviewed and stable  Last Vitals:  Vitals Value Taken Time  BP 89/52 04/29/22 1027  Temp    Pulse 70 04/29/22 1027  Resp 18 04/29/22 1027  SpO2 91 % 04/29/22 1027  Vitals shown include unvalidated device data.  Last Pain:  Vitals:   04/29/22 0614  TempSrc:   PainSc: 0-No pain         Complications: No notable events documented.

## 2022-04-29 NOTE — Progress Notes (Addendum)
Dr. Quentin Ore and Jonni Sanger, [place] in to see patient. Once off O2, patient is good to return to short stay per doctor.

## 2022-04-29 NOTE — Discharge Summary (Incomplete)
ELECTROPHYSIOLOGY PROCEDURE DISCHARGE SUMMARY    Patient ID: Jason Bolton,  MRN: [id number], DOB/AGE: November 24, 1956 66 y.o.  Admit date: 04/29/2022 Discharge date: 04/30/22   Primary Care Physician: Jason Sprout, FNP  Primary Cardiologist: Jason Sable, MD  Electrophysiologist: Dr. Quentin Bolton  Primary Discharge Diagnosis:  Persistent atrial fibrillation  Secondary Discharge Diagnosis:  Hematuria Enlarged prostate Morbid Obesity Body mass index is 39.25 kg/m.  Sleep Apnea on CPAP   Procedures This Admission:  1.  Electrophysiology study and radiofrequency catheter ablation of Atrial Fibrillation on 04/28/2022 by  Dr. Quentin Bolton .  This study demonstrated:  1. Successful PVI 2. Successful ablation/isolation of the posterior wall 3. Intracardiac echo reveals trivial pericardial effusion, mildly reduced LV function, dilated LA 4. No early apparent complications. 5. Colchicine 0.61m PO BID x 5 days 6. Protonix 42mPO daily x 45 days    Brief HPI: HePAVEL Bolton a [age].o. male with a history of persistent Atrial Fibrillation.  Risks, benefits, and alternatives to catheter ablation of Atrial Fibrillation were reviewed with the patient who wished to proceed.  The patient had not missed any doses of his anticoagulation and did not require TEE prior to the procedure   Hospital Course:  The patient was admitted and underwent EPS/RFCA of Atrial Fibrillation with details as outlined above.    The patient was initially slow to rouse post sedation and urinary retention. In and out cath failed x 2 as resistance was met. Pt had hematuria following this. Pt also noted to have intermittent O2 desaturation that gradually improved allowing to wean to room air. Given hematuria and slow recovery, it was recommended the patient be observed overnight.   They were monitored on telemetry overnight which demonstrated NSR.  Groin was without complication on the day of discharge. Hematuria had  resolved. The patient was examined and considered to be stable for discharge.  Wound care and restrictions were reviewed with the patient.  The patient will be seen back by DoRoderic PalauNP in 4 weeks and  Jason Bolton 12 weeks for post ablation follow up.   This patients CHA2DS2-VASc Score and unadjusted Ischemic Stroke Rate (% per year) is equal to 3.2 % stroke rate/year from a score of 3 Above score calculated as 1 point each if present [CHF, HTN, DM, Vascular=MI/PAD/Aortic Plaque, Age if 65-74, or Male] Above score calculated as 2 points each if present [Age > 75, or Stroke/TIA/TE]    Physical Exam: Vitals:   04/29/22 2122 04/29/22 2150 04/29/22 2158 04/30/22 0427  BP: 125/75 109/60  (!) 133/93  Pulse: 74 84 80 65  Resp: _0 Temp: 97.7 F (36.5 C) 97.7 F (36.5 C)  97.7 F (36.5 C)  TempSrc: Oral Oral  Oral  SpO2: 92% 92% 94% 94%  Weight:    134.9 kg  Height:        GEN- The patient is well appearing, alert and oriented x 3 today.   HEENT: normocephalic, atraumatic; sclera clear, conjunctiva pink; hearing intact; oropharynx clear; neck supple  Lungs- Clear to ausculation bilaterally, normal work of breathing.  No wheezes, rales, rhonchi Heart- Regular rate and rhythm, no murmurs, rubs or gallops  GI- soft, non-tender, non-distended, bowel sounds present  Extremities- no clubbing, cyanosis, or edema; DP/PT/radial pulses 2+ bilaterally, groin without hematoma/bruit MS- no significant deformity or atrophy Skin- warm and dry, no rash or lesion Psych- euthymic mood, full affect Neuro- strength and sensation are intact  Labs:   Lab Results  Component Value Date   WBC 10.4 04/09/2022   HGB 17.0 04/09/2022   HCT 57.0 (H) 04/09/2022   MCV 87.7 04/09/2022   PLT 159 04/09/2022    Recent Labs  Lab 04/30/22 0426  NA 137  K 4.8  CL 100  CO2 30  BUN 18  CREATININE 0.80  CALCIUM 8.6*  GLUCOSE 135*     Discharge Medications:  Allergies as of 04/30/2022        Reactions   Penicillins Other (See Comments)   Childhood Allergy        Medication List     TAKE these medications    apixaban 5 MG Tabs tablet Commonly known as: ELIQUIS Take 1 tablet (5 mg total) by mouth 2 (two) times daily.   aspirin EC 81 MG tablet Take 81 mg by mouth daily. Swallow whole.   Coenzyme Q10 50 MG Caps Take 50 mg by mouth every evening. CO ENZYME Q-10, 50MG (Oral Capsule)  1 po qd for 0 days  Quantity: 30.00;  Refills: 0   Ordered :31-Aug-2010  Jason Rana MD;  Started 31-Mar-2009 Active Comments: DX: 272.0   colchicine 0.6 MG tablet Take 1 tablet (0.6 mg total) by mouth 2 (two) times daily for 5 days.   lisinopril-hydrochlorothiazide 20-12.5 MG tablet Commonly known as: ZESTORETIC Take 1 tablet by mouth 2 (two) times daily.   Lubricant Eye Drops 0.4-0.3 % Soln Generic drug: Polyethyl Glycol-Propyl Glycol Place 1-2 drops into both eyes 3 (three) times daily as needed (dry/irritated eyes).   metoprolol tartrate 50 MG tablet Commonly known as: LOPRESSOR Take 1 tablet (50 mg total) by mouth 2 (two) times daily.   pantoprazole 40 MG tablet Commonly known as: PROTONIX Take 1 tablet (40 mg total) by mouth daily.   sildenafil 50 MG tablet Commonly known as: VIAGRA TAKE 1 TABLET BY MOUTH AS NEEDED FOR ERECTILE DYSFUNCTION GENERIC EQUIVALENT FOR VIAGRA   simvastatin 20 MG tablet Commonly known as: ZOCOR Take 1 tablet (20 mg total) by mouth at bedtime.   tamsulosin 0.4 MG Caps capsule Commonly known as: FLOMAX TAKE 2 CAPSULES BY MOUTH DAILY GENERIC EQUIVALENT FOR FLOMAX   Ventolin HFA 108 (90 Base) MCG/ACT inhaler Generic drug: albuterol Inhale 2 puffs into the lungs every 4 (four) hours as needed for wheezing.        Disposition:    Follow-up Information     [facility] Follow up.   Specialty: Cardiology Why: on 6/29 at 1130 for post hospital ablation follow up. Contact information: [id number] [place] [address] [phone]                Duration of Discharge Encounter: Greater than 30 minutes including physician time.  Jason Lefevre, PA-C  04/30/2022 8:49 AM

## 2022-04-29 NOTE — Anesthesia Postprocedure Evaluation (Signed)
Anesthesia Post Note  Patient: Jason Bolton  Procedure(s) Performed: ATRIAL FIBRILLATION ABLATION     Patient location during evaluation: PACU Anesthesia Type: General Level of consciousness: awake and alert Pain management: pain level controlled Vital Signs Assessment: post-procedure vital signs reviewed and stable Respiratory status: spontaneous breathing, nonlabored ventilation, respiratory function stable and patient connected to nasal cannula oxygen Cardiovascular status: blood pressure returned to baseline and stable Postop Assessment: no apparent nausea or vomiting Anesthetic complications: no   No notable events documented.  Last Vitals:  Vitals:   04/29/22 1706 04/29/22 1717  BP: 125/75   Pulse: 74   Resp: 20   Temp: 36.9 C   SpO2: 93% 95%    Last Pain:  Vitals:   04/29/22 1706  TempSrc: Oral  PainSc: 0-No pain                 Tiajuana Amass

## 2022-04-29 NOTE — Progress Notes (Signed)
  Pt with dark hematuria s/p in and out foley attempts x 2 and intermittent desaturations, O2 on RA 88-92 at rest currently.    Discussed with Dr. Quentin Ore and will observe overnight.   Legrand Como [address]" [place], PA-C  04/29/2022 2:44 PM

## 2022-04-29 NOTE — Progress Notes (Signed)
Pt ambulated w/o difficulty at 1330-sats on RA after high 80s-low 90s-Andy, PA at BS-pt voided 200cc bloody urine-PA aware-orders to be changed to admn

## 2022-04-29 NOTE — Discharge Instructions (Signed)
Cardiac Ablation, Care After  This sheet gives you information about how to care for yourself after your procedure. Your health care provider may also give you more specific instructions. If you have problems or questions, contact your health care provider. What can I expect after the procedure? After the procedure, it is common to have: Bruising around your puncture site. Tenderness around your puncture site. Skipped heartbeats. Tiredness (fatigue).  Follow these instructions at home: Puncture site care  Follow instructions from your health care provider about how to take care of your puncture site. Make sure you: If present, leave stitches (sutures), skin glue, or adhesive strips in place. These skin closures may need to stay in place for up to 2 weeks. If adhesive strip edges start to loosen and curl up, you may trim the loose edges. Do not remove adhesive strips completely unless your health care provider tells you to do that. If a large square bandage is present, this may be removed 24 hours after surgery.  Check your puncture site every day for signs of infection. Check for: Redness, swelling, or pain. Fluid or blood. If your puncture site starts to bleed, lie down on your back, apply firm pressure to the area, and contact your health care provider. Warmth. Pus or a bad smell. A pea or small marble sized lump at the site is normal and can take up to three months to resolve.  Driving Do not drive for at least 4 days after your procedure or however long your health care provider recommends. (Do not resume driving if you have previously been instructed not to drive for other health reasons.) Do not drive or use heavy machinery while taking prescription pain medicine. Activity Avoid activities that take a lot of effort for at least 7 days after your procedure. Do not lift anything that is heavier than 5 lb (4.5 kg) for one week.  No sexual activity for 1 week.  Return to your normal  activities as told by your health care provider. Ask your health care provider what activities are safe for you. General instructions Take over-the-counter and prescription medicines only as told by your health care provider. Do not use any products that contain nicotine or tobacco, such as cigarettes and e-cigarettes. If you need help quitting, ask your health care provider. You may shower after 24 hours, but Do not take baths, swim, or use a hot tub for 1 week.  Do not drink alcohol for 24 hours after your procedure. Keep all follow-up visits as told by your health care provider. This is important. Contact a health care provider if: You have redness, mild swelling, or pain around your puncture site. You have fluid or blood coming from your puncture site that stops after applying firm pressure to the area. Your puncture site feels warm to the touch. You have pus or a bad smell coming from your puncture site. You have a fever. You have chest pain or discomfort that spreads to your neck, jaw, or arm. You are sweating a lot. You feel nauseous. You have a fast or irregular heartbeat. You have shortness of breath. You are dizzy or light-headed and feel the need to lie down. You have pain or numbness in the arm or leg closest to your puncture site. Get help right away if: Your puncture site suddenly swells. Your puncture site is bleeding and the bleeding does not stop after applying firm pressure to the area. These symptoms may represent a serious problem that is   an emergency. Do not wait to see if the symptoms will go away. Get medical help right away. Call your local emergency services (911 in the [place] Do not drive yourself to the hospital. Summary After the procedure, it is normal to have bruising and tenderness at the puncture site in your groin, neck, or forearm. Check your puncture site every day for signs of infection. Get help right away if your puncture site is bleeding and the  bleeding does not stop after applying firm pressure to the area. This is a medical emergency. This information is not intended to replace advice given to you by your health care provider. Make sure you discuss any questions you have with your health care provider.   Consider an AliveCor to monitor your Heart rhythm at home :   AliveCor  FDA-cleared EKG at your fingertips. - [employer]   Agricultural engineer, [employer]. https://store.alivecor.com/products/kardiamobile   FDA-cleared, clinical grade mobile EKG monitor: Jason Bolton is the most clinically-validated mobile EKG used by the world's leading cardiac care medical professionals.  This may be useful in monitoring palpitations.  We do not have access to have them emailed and reviewed but will be glad to review while in the office.

## 2022-04-29 NOTE — Progress Notes (Signed)
Lytle Michaels, PA updated. Bladder scan done; scan shows empty. Patient states "it is getting that way" when asked if he has the urge to urinate.

## 2022-04-30 ENCOUNTER — Encounter (HOSPITAL_COMMUNITY): Payer: Self-pay | Admitting: Cardiology

## 2022-04-30 DIAGNOSIS — I110 Hypertensive heart disease with heart failure: Secondary | ICD-10-CM | POA: Diagnosis not present

## 2022-04-30 DIAGNOSIS — Z6839 Body mass index (BMI) 39.0-39.9, adult: Secondary | ICD-10-CM | POA: Diagnosis not present

## 2022-04-30 DIAGNOSIS — R338 Other retention of urine: Secondary | ICD-10-CM | POA: Diagnosis not present

## 2022-04-30 DIAGNOSIS — I5020 Unspecified systolic (congestive) heart failure: Secondary | ICD-10-CM | POA: Diagnosis not present

## 2022-04-30 DIAGNOSIS — R319 Hematuria, unspecified: Secondary | ICD-10-CM | POA: Diagnosis not present

## 2022-04-30 DIAGNOSIS — E785 Hyperlipidemia, unspecified: Secondary | ICD-10-CM | POA: Diagnosis not present

## 2022-04-30 DIAGNOSIS — Z87891 Personal history of nicotine dependence: Secondary | ICD-10-CM | POA: Diagnosis not present

## 2022-04-30 DIAGNOSIS — G4733 Obstructive sleep apnea (adult) (pediatric): Secondary | ICD-10-CM | POA: Diagnosis not present

## 2022-04-30 DIAGNOSIS — N401 Enlarged prostate with lower urinary tract symptoms: Secondary | ICD-10-CM | POA: Diagnosis not present

## 2022-04-30 DIAGNOSIS — I4819 Other persistent atrial fibrillation: Secondary | ICD-10-CM | POA: Diagnosis not present

## 2022-04-30 DIAGNOSIS — Z7722 Contact with and (suspected) exposure to environmental tobacco smoke (acute) (chronic): Secondary | ICD-10-CM | POA: Diagnosis not present

## 2022-04-30 LAB — BASIC METABOLIC PANEL
Anion gap: 7 (ref 5–15)
BUN: 18 mg/dL (ref 8–23)
CO2: 30 mmol/L (ref 22–32)
Calcium: 8.6 mg/dL — ABNORMAL LOW (ref 8.9–10.3)
Chloride: 100 mmol/L (ref 98–111)
Creatinine, Ser: 0.8 mg/dL (ref 0.61–1.24)
GFR, Estimated: >60 mL/min (ref >60)
Glucose, Bld: 135 mg/dL — ABNORMAL HIGH (ref 70–99)
Potassium: 4.8 mmol/L (ref 3.5–5.1)
Sodium: 137 mmol/L (ref 135–145)

## 2022-04-30 LAB — MAGNESIUM: Magnesium: 1.9 mg/dL (ref 1.7–2.4)

## 2022-04-30 NOTE — Progress Notes (Signed)
SATURATION QUALIFICATIONS: (This note is used to comply with regulatory documentation for home oxygen)  Patient Saturations on Room Air at Rest = 92%  Patient Saturations on Room Air while Ambulating = 90%  Patient Saturations on  Liters of oxygen while Ambulating = %  Please briefly explain why patient needs home oxygen: No Need

## 2022-04-30 NOTE — Plan of Care (Signed)
  Problem: Education: Goal: Knowledge of General Education information will improve Description: Including pain rating scale, medication(s)/side effects and non-pharmacologic comfort measures Outcome: Progressing   Problem: Clinical Measurements: Goal: Respiratory complications will improve Outcome: Progressing Goal: Cardiovascular complication will be avoided Outcome: Progressing   Problem: Elimination: Goal: Will not experience complications related to urinary retention Outcome: Progressing   Problem: Pain Managment: Goal: General experience of comfort will improve Outcome: Progressing

## 2022-05-07 DIAGNOSIS — D751 Secondary polycythemia: Secondary | ICD-10-CM | POA: Diagnosis not present

## 2022-05-20 ENCOUNTER — Inpatient Hospital Stay: Payer: BC Managed Care – PPO

## 2022-05-21 DIAGNOSIS — D751 Secondary polycythemia: Secondary | ICD-10-CM | POA: Diagnosis not present

## 2022-05-27 ENCOUNTER — Encounter (HOSPITAL_COMMUNITY): Payer: Self-pay | Admitting: Nurse Practitioner

## 2022-05-27 ENCOUNTER — Ambulatory Visit (HOSPITAL_COMMUNITY)
Admission: RE | Admit: 2022-05-27 | Discharge: 2022-05-27 | Disposition: A | Discharge status: Home / Self Care | Payer: BC Managed Care – PPO | Source: Ambulatory Visit | Attending: Nurse Practitioner | Admitting: Nurse Practitioner

## 2022-05-27 VITALS — BP 108/74 | HR 61 | Ht 73.0 in | Wt 291.2 lb

## 2022-05-27 DIAGNOSIS — I714 Abdominal aortic aneurysm, without rupture, unspecified: Secondary | ICD-10-CM | POA: Diagnosis not present

## 2022-05-27 DIAGNOSIS — Z7901 Long term (current) use of anticoagulants: Secondary | ICD-10-CM | POA: Diagnosis not present

## 2022-05-27 DIAGNOSIS — I10 Essential (primary) hypertension: Secondary | ICD-10-CM | POA: Diagnosis not present

## 2022-05-27 DIAGNOSIS — I4819 Other persistent atrial fibrillation: Secondary | ICD-10-CM | POA: Diagnosis not present

## 2022-05-27 DIAGNOSIS — D6869 Other thrombophilia: Secondary | ICD-10-CM | POA: Diagnosis not present

## 2022-05-27 NOTE — Progress Notes (Signed)
Primary Care Physician: Gwyneth Sprout, FNP Referring Physician: Dr. Mcneil Sober Jason Bolton is a 66 y.o. male with a h/o HTN, AAA, afib that is one month s/p afib ablation. He reports no swallowing or groin issues. No afib to report. He feels he is back to his usual activities.   Today, he denies symptoms of palpitations, chest pain, shortness of breath, orthopnea, PND, lower extremity edema, dizziness, presyncope, syncope, or neurologic sequela. The patient is tolerating medications without difficulties and is otherwise without complaint today.   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   Polycythemia    Sleep apnea    Past Surgical History:  Procedure Laterality Date   ATRIAL FIBRILLATION ABLATION N/A 04/29/2022   Procedure: ATRIAL FIBRILLATION ABLATION;  Surgeon: Vickie Epley, MD;  Location: [place] [facility];  Service: Cardiovascular;  Laterality: N/A;   CARDIOVERSION N/A 03/04/2021   Procedure: CARDIOVERSION;  Surgeon: Kate Sable, MD;  Location: [facility];  Service: Cardiovascular;  Laterality: N/A;   CATARACT EXTRACTION Right 11/2013   COLONOSCOPY  2006   COLONOSCOPY WITH PROPOFOL N/A 10/19/2017   Procedure: COLONOSCOPY WITH PROPOFOL;  Surgeon: [name], MD;  Location: [facility];  Service: Endoscopy;  Laterality: N/A;   INGUINAL HERNIA REPAIR Right 1972   MANDIBLE SURGERY  2005   [facility]   PELVIC FRACTURE SURGERY  10/2021    Current Outpatient Medications  Medication Sig Dispense Refill   apixaban (ELIQUIS) 5 MG TABS tablet Take 1 tablet (5 mg total) by mouth 2 (two) times daily. 180 tablet 3   aspirin EC 81 MG tablet Take 81 mg by mouth daily. Swallow whole.     Coenzyme Q10 50 MG CAPS Take 50 mg by mouth every evening. CO ENZYME Q-10, '50MG'$  (Oral Capsule)  1 po qd for 0 days  Quantity: 30.00;  Refills: 0   Ordered  :31-Aug-2010  Margarita Rana MD;  Started 31-Mar-2009 Active Comments: DX: 272.0     lisinopril-hydrochlorothiazide (ZESTORETIC) 20-12.5 MG tablet Take 1 tablet by mouth 2 (two) times daily. 180 tablet 0   metoprolol tartrate (LOPRESSOR) 50 MG tablet Take 1 tablet (50 mg total) by mouth 2 (two) times daily. 180 tablet 3   montelukast (SINGULAIR) 10 MG tablet Take 10 mg by mouth as needed.     pantoprazole (PROTONIX) 40 MG tablet Take 1 tablet (40 mg total) by mouth daily. 45 tablet 0   Polyethyl Glycol-Propyl Glycol (LUBRICANT EYE DROPS) 0.4-0.3 % SOLN Place 1-2 drops into both eyes 3 (three) times daily as needed (dry/irritated eyes).     sildenafil (VIAGRA) 50 MG tablet TAKE 1 TABLET BY MOUTH AS NEEDED FOR ERECTILE DYSFUNCTION GENERIC EQUIVALENT FOR VIAGRA 8 tablet 3   simvastatin (ZOCOR) 20 MG tablet Take 1 tablet (20 mg total) by mouth at bedtime. 90 tablet 3   tamsulosin (FLOMAX) 0.4 MG CAPS capsule TAKE 2 CAPSULES BY MOUTH DAILY GENERIC EQUIVALENT FOR FLOMAX 180 capsule 3   VENTOLIN HFA 108 (90 Base) MCG/ACT inhaler Inhale 2 puffs into the lungs every 4 (four) hours as needed for wheezing.     No current facility-administered medications for this encounter.    Allergies  Allergen Reactions   Penicillins Other (See Comments)    Childhood Allergy    Social History   Socioeconomic History   Marital status: Married  Spouse name: Not on file   Number of children: 2   Years of education: Not on file   Highest education level: Not on file  Occupational History   Occupation: supervisor  Tobacco Use   Smoking status: Former    Packs/day: 1.50    Years: 20.00    Total pack years: 30.00    Types: Cigarettes    Quit date: 11/29/2015    Years since quitting: 6.4   Smokeless tobacco: Never   Tobacco comments:    quit June of 2008 after Chantix. he was exposed to secondary smoke  Vaping Use   Vaping Use: Never used  Substance and Sexual Activity   Alcohol use: Yes    Alcohol/week:  0.0 standard drinks of alcohol    Comment: occasional   Drug use: No   Sexual activity: Not on file  Other Topics Concern   Not on file  Social History Narrative   Not on file   Social Determinants of Health   Financial Resource Strain: Not on file  Food Insecurity: Not on file  Transportation Needs: Not on file  Physical Activity: Not on file  Stress: Not on file  Social Connections: Not on file  Intimate Partner Violence: Not on file    Family History  Problem Relation Age of Onset   Diabetes Mother    Congestive Heart Failure Mother    Pancreatic cancer Father    Healthy Sister    Healthy Brother    Healthy Brother    Healthy Brother     ROS- All systems are reviewed and negative except as per the HPI above  Physical Exam: Vitals:   05/27/22 1059  BP: 108/74  Pulse: 61  Weight: 132.1 kg  Height: '6\' 1"'$  (1.854 m)   Wt Readings from Last 3 Encounters:  05/27/22 132.1 kg  04/30/22 134.9 kg  02/16/22 130.2 kg    Labs: Lab Results  Component Value Date   NA 137 04/30/2022   K 4.8 04/30/2022   CL 100 04/30/2022   CO2 30 04/30/2022   GLUCOSE 135 (H) 04/30/2022   BUN 18 04/30/2022   CREATININE 0.80 04/30/2022   CALCIUM 8.6 (L) 04/30/2022   MG 1.9 04/30/2022   Lab Results  Component Value Date   INR 1.1 03/04/2021   Lab Results  Component Value Date   CHOL 122 01/14/2021   HDL 35 (L) 01/14/2021   LDLCALC 72 01/14/2021   TRIG 73 01/14/2021     GEN- The patient is well appearing, alert and oriented x 3 today.   Head- normocephalic, atraumatic Eyes-  Sclera clear, conjunctiva pink Ears- hearing intact Oropharynx- clear Neck- supple, no JVP Lymph- no cervical lymphadenopathy Lungs- Clear to ausculation bilaterally, normal work of breathing Heart- Regular rate and rhythm, no murmurs, rubs or gallops, PMI not laterally displaced GI- soft, NT, ND, + BS Extremities- no clubbing, cyanosis, or edema MS- no significant deformity or atrophy Skin- no  rash or lesion Psych- euthymic mood, full affect Neuro- strength and sensation are intact  EKG-NSR at 61 bpm, qrs int 190 ms, qtc 386 ms  Epic records reviewed     Assessment and Plan:  1. Afib  S/p ablation x one month and is doing well Maintaining SR   2. CHA2DS2VASc  score of  2 Continue eliquis 5 mg bid without interruption   3. HTN Stable   F/u with Dr. Quentin Ore as scheduled 8/30   Butch Penny C. Bron Snellings, [facility]  [address] [place], [place] [address] [phone]

## 2022-06-04 DIAGNOSIS — D751 Secondary polycythemia: Secondary | ICD-10-CM | POA: Diagnosis not present

## 2022-06-18 ENCOUNTER — Ambulatory Visit: Payer: BC Managed Care – PPO | Admitting: Internal Medicine

## 2022-06-18 ENCOUNTER — Other Ambulatory Visit: Payer: BC Managed Care – PPO

## 2022-06-21 DIAGNOSIS — I10 Essential (primary) hypertension: Secondary | ICD-10-CM | POA: Diagnosis not present

## 2022-06-21 DIAGNOSIS — E785 Hyperlipidemia, unspecified: Secondary | ICD-10-CM | POA: Diagnosis not present

## 2022-06-21 DIAGNOSIS — Z0000 Encounter for general adult medical examination without abnormal findings: Secondary | ICD-10-CM | POA: Diagnosis not present

## 2022-06-21 DIAGNOSIS — R0902 Hypoxemia: Secondary | ICD-10-CM | POA: Diagnosis not present

## 2022-06-21 DIAGNOSIS — I480 Paroxysmal atrial fibrillation: Secondary | ICD-10-CM | POA: Diagnosis not present

## 2022-06-21 DIAGNOSIS — Z1159 Encounter for screening for other viral diseases: Secondary | ICD-10-CM | POA: Diagnosis not present

## 2022-06-21 DIAGNOSIS — D45 Polycythemia vera: Secondary | ICD-10-CM | POA: Diagnosis not present

## 2022-06-21 DIAGNOSIS — Z136 Encounter for screening for cardiovascular disorders: Secondary | ICD-10-CM | POA: Diagnosis not present

## 2022-06-21 DIAGNOSIS — I7143 Infrarenal abdominal aortic aneurysm, without rupture: Secondary | ICD-10-CM | POA: Diagnosis not present

## 2022-07-12 ENCOUNTER — Other Ambulatory Visit: Payer: Self-pay | Admitting: *Deleted

## 2022-07-12 NOTE — Telephone Encounter (Signed)
LMOV  

## 2022-07-13 DIAGNOSIS — H04123 Dry eye syndrome of bilateral lacrimal glands: Secondary | ICD-10-CM | POA: Diagnosis not present

## 2022-07-14 DIAGNOSIS — Z87891 Personal history of nicotine dependence: Secondary | ICD-10-CM | POA: Diagnosis not present

## 2022-07-14 DIAGNOSIS — Z136 Encounter for screening for cardiovascular disorders: Secondary | ICD-10-CM | POA: Diagnosis not present

## 2022-07-16 DIAGNOSIS — D751 Secondary polycythemia: Secondary | ICD-10-CM | POA: Diagnosis not present

## 2022-07-27 MED ORDER — LISINOPRIL-HYDROCHLOROTHIAZIDE 20-12.5 MG PO TABS: 1.0000 | ORAL_TABLET | Freq: Two times a day (BID) | ORAL | 0 refills | Status: DC

## 2022-07-28 ENCOUNTER — Ambulatory Visit: Payer: BC Managed Care – PPO | Attending: Cardiology | Admitting: Cardiology

## 2022-07-28 ENCOUNTER — Encounter: Payer: Self-pay | Admitting: Cardiology

## 2022-07-28 VITALS — BP 124/82 | HR 64 | Ht 72.0 in | Wt 292.6 lb

## 2022-07-28 DIAGNOSIS — I10 Essential (primary) hypertension: Secondary | ICD-10-CM

## 2022-07-28 DIAGNOSIS — I4819 Other persistent atrial fibrillation: Secondary | ICD-10-CM

## 2022-07-28 DIAGNOSIS — I5020 Unspecified systolic (congestive) heart failure: Secondary | ICD-10-CM | POA: Diagnosis not present

## 2022-07-28 MED ORDER — METOPROLOL TARTRATE 50 MG PO TABS: 50.0000 mg | ORAL_TABLET | Freq: Two times a day (BID) | ORAL | 3 refills | Status: DC

## 2022-07-28 MED ORDER — APIXABAN 5 MG PO TABS: 5.0000 mg | ORAL_TABLET | Freq: Two times a day (BID) | ORAL | 3 refills | Status: DC

## 2022-07-28 MED ORDER — LISINOPRIL-HYDROCHLOROTHIAZIDE 20-12.5 MG PO TABS: 1.0000 | ORAL_TABLET | Freq: Two times a day (BID) | ORAL | 0 refills | Status: DC

## 2022-07-28 NOTE — Progress Notes (Signed)
Electrophysiology Office Follow up Visit Note:    Date:  07/28/2022   ID:  BRINLEY TREANOR, DOB 1956-11-26, MRN [id number]  PCP:  Gwyneth Sprout, FNP  [facility] Cardiologist:  Kate Sable, MD  [facility] Electrophysiologist:  Vickie Epley, MD    Interval History:    Jason Bolton is a 66 y.o. male who presents for a follow up visit.  He had an A-fib ablation on April 29, 2022.  During that procedure the veins and posterior wall were isolated.  The patient saw Roderic Palau in the A-fib clinic on May 27, 2022 in follow-up.  At that appointment, he was maintaining sinus rhythm.  He takes Eliquis for stroke prophylaxis.  He feels better now that he is maintaining normal rhythm.  He is doing okay on his Eliquis without bleeding issues.  He checks his heart rhythm twice daily using an [employer].     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   Polycythemia    Sleep apnea     Past Surgical History:  Procedure Laterality Date   ATRIAL FIBRILLATION ABLATION N/A 04/29/2022   Procedure: ATRIAL FIBRILLATION ABLATION;  Surgeon: Vickie Epley, MD;  Location: [place] [facility];  Service: Cardiovascular;  Laterality: N/A;   CARDIOVERSION N/A 03/04/2021   Procedure: CARDIOVERSION;  Surgeon: Kate Sable, MD;  Location: [facility];  Service: Cardiovascular;  Laterality: N/A;   CATARACT EXTRACTION Right 11/2013   COLONOSCOPY  2006   COLONOSCOPY WITH PROPOFOL N/A 10/19/2017   Procedure: COLONOSCOPY WITH PROPOFOL;  Surgeon: [name], MD;  Location: [facility];  Service: Endoscopy;  Laterality: N/A;   INGUINAL HERNIA REPAIR Right 1972   MANDIBLE SURGERY  2005   [facility]   PELVIC FRACTURE SURGERY  10/2021    Current Medications: Current Meds  Medication Sig   apixaban (ELIQUIS) 5 MG TABS tablet Take 1 tablet (5 mg  total) by mouth 2 (two) times daily.   aspirin EC 81 MG tablet Take 81 mg by mouth daily. Swallow whole.   Coenzyme Q10 50 MG CAPS Take 50 mg by mouth every evening. CO ENZYME Q-10, '50MG'$  (Oral Capsule)  1 po qd for 0 days  Quantity: 30.00;  Refills: 0   Ordered :31-Aug-2010  Margarita Rana MD;  Started 31-Mar-2009 Active Comments: DX: 272.0   lisinopril-hydrochlorothiazide (ZESTORETIC) 20-12.5 MG tablet Take 1 tablet by mouth 2 (two) times daily.   metoprolol tartrate (LOPRESSOR) 50 MG tablet Take 1 tablet (50 mg total) by mouth 2 (two) times daily.   montelukast (SINGULAIR) 10 MG tablet Take 10 mg by mouth as needed.   Polyethyl Glycol-Propyl Glycol (LUBRICANT EYE DROPS) 0.4-0.3 % SOLN Place 1-2 drops into both eyes 3 (three) times daily as needed (dry/irritated eyes).   sildenafil (VIAGRA) 50 MG tablet TAKE 1 TABLET BY MOUTH AS NEEDED FOR ERECTILE DYSFUNCTION GENERIC EQUIVALENT FOR VIAGRA   simvastatin (ZOCOR) 20 MG tablet Take 1 tablet (20 mg total) by mouth at bedtime.   tamsulosin (FLOMAX) 0.4 MG CAPS capsule TAKE 2 CAPSULES BY MOUTH DAILY GENERIC EQUIVALENT FOR FLOMAX   VENTOLIN HFA 108 (90 Base) MCG/ACT inhaler Inhale 2 puffs into the lungs every 4 (four) hours as needed for wheezing.     Allergies:   Penicillins   Social History   Socioeconomic History   Marital status: Married  Spouse name: Not on file   Number of children: 2   Years of education: Not on file   Highest education level: Not on file  Occupational History   Occupation: supervisor  Tobacco Use   Smoking status: Former    Packs/day: 1.50    Years: 20.00    Total pack years: 30.00    Types: Cigarettes    Quit date: 11/29/2015    Years since quitting: 6.6   Smokeless tobacco: Never   Tobacco comments:    quit June of 2008 after Chantix. he was exposed to secondary smoke  Vaping Use   Vaping Use: Never used  Substance and Sexual Activity   Alcohol use: Yes    Alcohol/week: 0.0 standard drinks of alcohol     Comment: occasional   Drug use: No   Sexual activity: Not on file  Other Topics Concern   Not on file  Social History Narrative   Not on file   Social Determinants of Health   Financial Resource Strain: Not on file  Food Insecurity: Not on file  Transportation Needs: Not on file  Physical Activity: Not on file  Stress: Not on file  Social Connections: Not on file     Family History: The patient's family history includes Congestive Heart Failure in his mother; Diabetes in his mother; Healthy in his brother, brother, brother, and sister; Pancreatic cancer in his father.  ROS:   Please see the history of present illness.    All other systems reviewed and are negative.  EKGs/Labs/Other Studies Reviewed:    The following studies were reviewed today:   EKG:  The ekg ordered today demonstrates normal sinus rhythm.  Recent Labs: 02/16/2022: ALT 19 04/09/2022: Hemoglobin 17.0; Platelets 159 04/30/2022: BUN 18; Creatinine, Ser 0.80; Magnesium 1.9; Potassium 4.8; Sodium 137  Recent Lipid Panel    Component Value Date/Time   CHOL 122 01/14/2021 0913   TRIG 73 01/14/2021 0913   HDL 35 (L) 01/14/2021 0913   CHOLHDL 3.5 01/14/2021 0913   LDLCALC 72 01/14/2021 0913    Physical Exam:    VS:  BP 124/82   Pulse 64   Ht 6' (1.829 m)   Wt 292 lb 9.6 oz (132.7 kg)   SpO2 96%   BMI 39.68 kg/m     Wt Readings from Last 3 Encounters:  07/28/22 292 lb 9.6 oz (132.7 kg)  05/27/22 291 lb 3.2 oz (132.1 kg)  04/30/22 297 lb 8 oz (134.9 kg)     GEN:  Well nourished, well developed in no acute distress.  Obese HEENT: Normal NECK: No JVD; No carotid bruits LYMPHATICS: No lymphadenopathy CARDIAC: RRR, no murmurs, rubs, gallops RESPIRATORY:  Clear to auscultation without rales, wheezing or rhonchi  ABDOMEN: Soft, non-tender, non-distended MUSCULOSKELETAL:  No edema; No deformity  SKIN: Warm and dry NEUROLOGIC:  Alert and oriented x 3 PSYCHIATRIC:  Normal affect         ASSESSMENT:    1. Persistent atrial fibrillation (HCC)   2. HFrEF (heart failure with reduced ejection fraction) ([place])   3. Primary hypertension    PLAN:    In order of problems listed above:  #Persistent atrial fibrillation Maintaining sinus rhythm after his ablation procedure.  Feels better in sinus rhythm.  Rhythm control indicated. On Eliquis for stroke prophylaxis Continue metoprolol.   #History of chronic systolic heart failure Last ejection fraction had recovered to 55%.  Rhythm control indicated. Continue lisinopril, metoprolol.  #Hypertension Controlled.  Follow-up 1 year.  APP appointment okay.   Medication Adjustments/Labs and Tests Ordered: Current medicines are reviewed at length with the patient today.  Concerns regarding medicines are outlined above.  No orders of the defined types were placed in this encounter.  No orders of the defined types were placed in this encounter.    Signed, Lars Mage, MD, [facility], [facility] 07/28/2022 11:41 AM    Electrophysiology [facility]

## 2022-07-28 NOTE — Patient Instructions (Signed)
Medication Instructions:  none *If you need a refill on your cardiac medications before your next appointment, please call your pharmacy*   Lab Work: none If you have labs (blood work) drawn today and your tests are completely normal, you will receive your results only by: [place] (if you have MyChart) OR A paper copy in the mail If you have any lab test that is abnormal or we need to change your treatment, we will call you to review the results.   Testing/Procedures: none   Follow-Up: At [facility], you and your health needs are our priority.  As part of our continuing mission to provide you with exceptional heart care, we have created designated Provider Care Teams.  These Care Teams include your primary Cardiologist (physician) and Advanced Practice Providers (APPs -  Physician Assistants and Nurse Practitioners) who all work together to provide you with the care you need, when you need it.  We recommend signing up for the patient portal called "MyChart".  Sign up information is provided on this After Visit Summary.  MyChart is used to connect with patients for Virtual Visits (Telemedicine).  Patients are able to view lab/test results, encounter notes, upcoming appointments, etc.  Non-urgent messages can be sent to your provider as well.   To learn more about what you can do with MyChart, go to [email or web address].    Your next appointment:   1 year(s)  The format for your next appointment:   In Person  Provider:   You will see one of the following Advanced Practice Providers on your designated Care Team:   Murray Hodgkins, NP Christell Faith, PA-C Cadence Kathlen Mody, PA-C Gerrie Nordmann, NP      Other Instructions none  Important Information About Sugar

## 2022-08-04 NOTE — Addendum Note (Signed)
Addended by: Anselm Pancoast on: 08/04/2022 10:51 AM   Modules accepted: Orders

## 2022-08-17 ENCOUNTER — Telehealth: Payer: Self-pay

## 2022-08-17 NOTE — Telephone Encounter (Addendum)
Patient is due for their 5 year follow up colonoscopy. Last one done 10/19/17 by Dr Bary Castilla. Spoke with the patient and he has moved to [place] and will have this done there.

## 2022-08-26 DIAGNOSIS — D751 Secondary polycythemia: Secondary | ICD-10-CM | POA: Diagnosis not present

## 2022-08-27 ENCOUNTER — Encounter: Payer: Self-pay | Admitting: Cardiology

## 2022-08-27 ENCOUNTER — Ambulatory Visit: Payer: BC Managed Care – PPO | Attending: Cardiology | Admitting: Cardiology

## 2022-08-27 VITALS — BP 118/78 | HR 80 | Ht 73.0 in | Wt 292.8 lb

## 2022-08-27 DIAGNOSIS — I4891 Unspecified atrial fibrillation: Secondary | ICD-10-CM | POA: Diagnosis not present

## 2022-08-27 DIAGNOSIS — I10 Essential (primary) hypertension: Secondary | ICD-10-CM

## 2022-08-27 DIAGNOSIS — Z6838 Body mass index (BMI) 38.0-38.9, adult: Secondary | ICD-10-CM | POA: Diagnosis not present

## 2022-08-27 DIAGNOSIS — E7800 Pure hypercholesterolemia, unspecified: Secondary | ICD-10-CM | POA: Diagnosis not present

## 2022-08-27 NOTE — Progress Notes (Signed)
Cardiology Office Note:    Date:  08/27/2022   ID:  Jason Bolton, DOB 12/07/1955, MRN [id number]  PCP:  Gwyneth Sprout, [place]  Cardiologist:  Kate Sable, MD  Advanced Practice Provider:  No care team member to display Electrophysiologist:  Vickie Epley, MD       Referring MD: Gwyneth Sprout, FNP   Chief Complaint  Patient presents with   Follow-up    Afib,non new Cardiac concerns     History of Present Illness:    Jason Bolton is a 66 y.o. male with a hx of paroxysmal atrial fibrillation (failed DCCV 02/2021), s/p RFA 04/2022, hypertension, hyperlipidemia, sleep apnea on CPAP, former smoker x40+ years who presents for follow-up.   Being seen for persistent atrial fibrillation, underwent RFA ablation 04/2022.  Has been maintaining sinus rhythm since, takes Eliquis for stroke prophylaxis, denies any bleeding issues.  Feels much better in sinus rhythm.  Has a cardia device at home which has been showing sinus rhythm until 2 weeks ago-when it indicated possible atrial fibrillation.  He denies symptoms of palpitations or shortness of breath, compliant with CPAP mask.  Prior notes Echocardiogram 02/05/2021 showed mildly reduced ejection fraction, EF 45 to 50%.  Mildly dilated LA, severely dilated RA. DC cardioversion 02/2021 successfully.  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   Polycythemia    Sleep apnea     Past Surgical History:  Procedure Laterality Date   ATRIAL FIBRILLATION ABLATION N/A 04/29/2022   Procedure: ATRIAL FIBRILLATION ABLATION;  Surgeon: Vickie Epley, MD;  Location: [facility];  Service: Cardiovascular;  Laterality: N/A;   CARDIOVERSION N/A 03/04/2021   Procedure: CARDIOVERSION;  Surgeon: Kate Sable, MD;  Location: [facility];  Service: Cardiovascular;  Laterality:  N/A;   CATARACT EXTRACTION Right 11/2013   COLONOSCOPY  2006   COLONOSCOPY WITH PROPOFOL N/A 10/19/2017   Procedure: COLONOSCOPY WITH PROPOFOL;  Surgeon: [name], MD;  Location: [facility];  Service: Endoscopy;  Laterality: N/A;   INGUINAL HERNIA REPAIR Right 1972   MANDIBLE SURGERY  2005   [facility]   PELVIC FRACTURE SURGERY  10/2021    Current Medications: Current Meds  Medication Sig   apixaban (ELIQUIS) 5 MG TABS tablet Take 1 tablet (5 mg total) by mouth 2 (two) times daily.   aspirin EC 81 MG tablet Take 81 mg by mouth daily. Swallow whole.   Coenzyme Q10 50 MG CAPS Take 50 mg by mouth every evening. CO ENZYME Q-10, '50MG'$  (Oral Capsule)  1 po qd for 0 days  Quantity: 30.00;  Refills: 0   Ordered :31-Aug-2010  Margarita Rana MD;  Started 31-Mar-2009 Active Comments: DX: 272.0   lisinopril-hydrochlorothiazide (ZESTORETIC) 20-12.5 MG tablet Take 1 tablet by mouth 2 (two) times daily.   metoprolol tartrate (LOPRESSOR) 50 MG tablet Take 1 tablet (50 mg total) by mouth 2 (two) times daily.   montelukast (SINGULAIR) 10 MG tablet Take 10 mg by mouth as needed.   pantoprazole (PROTONIX) 40 MG tablet Take 1 tablet (40 mg total) by mouth daily.   Polyethyl Glycol-Propyl Glycol (LUBRICANT EYE DROPS) 0.4-0.3 % SOLN Place 1-2 drops into both eyes 3 (three) times daily as needed (dry/irritated eyes).   sildenafil (VIAGRA) 50 MG tablet TAKE 1 TABLET BY MOUTH AS NEEDED FOR ERECTILE  DYSFUNCTION GENERIC EQUIVALENT FOR VIAGRA   simvastatin (ZOCOR) 20 MG tablet Take 1 tablet (20 mg total) by mouth at bedtime.   tamsulosin (FLOMAX) 0.4 MG CAPS capsule TAKE 2 CAPSULES BY MOUTH DAILY GENERIC EQUIVALENT FOR FLOMAX   VENTOLIN HFA 108 (90 Base) MCG/ACT inhaler Inhale 2 puffs into the lungs every 4 (four) hours as needed for wheezing.     Allergies:   Penicillins   Social History   Socioeconomic History   Marital status: Married    Spouse name: Not on file   Number of children: 2   Years of  education: Not on file   Highest education level: Not on file  Occupational History   Occupation: supervisor  Tobacco Use   Smoking status: Former    Packs/day: 1.50    Years: 20.00    Total pack years: 30.00    Types: Cigarettes    Quit date: 11/29/2015    Years since quitting: 6.7   Smokeless tobacco: Never   Tobacco comments:    quit June of 2008 after Chantix. he was exposed to secondary smoke  Vaping Use   Vaping Use: Never used  Substance and Sexual Activity   Alcohol use: Yes    Alcohol/week: 0.0 standard drinks of alcohol    Comment: occasional   Drug use: No   Sexual activity: Not on file  Other Topics Concern   Not on file  Social History Narrative   Not on file   Social Determinants of Health   Financial Resource Strain: Not on file  Food Insecurity: Not on file  Transportation Needs: Not on file  Physical Activity: Not on file  Stress: Not on file  Social Connections: Not on file     Family History: The patient's family history includes Congestive Heart Failure in his mother; Diabetes in his mother; Healthy in his brother, brother, brother, and sister; Pancreatic cancer in his father.  ROS:   Please see the history of present illness.     All other systems reviewed and are negative.  EKGs/Labs/Other Studies Reviewed:    The following studies were reviewed today:   EKG:  EKG is  ordered today.  The ekg ordered today demonstrates atrial fibrillation , HR 80  Recent Labs: 02/16/2022: ALT 19 04/09/2022: Hemoglobin 17.0; Platelets 159 04/30/2022: BUN 18; Creatinine, Ser 0.80; Magnesium 1.9; Potassium 4.8; Sodium 137  Recent Lipid Panel    Component Value Date/Time   CHOL 122 01/14/2021 0913   TRIG 73 01/14/2021 0913   HDL 35 (L) 01/14/2021 0913   CHOLHDL 3.5 01/14/2021 0913   LDLCALC 72 01/14/2021 0913     Risk Assessment/Calculations:      Physical Exam:    VS:  BP 118/78 (BP Location: Left Arm, Patient Position: Sitting, Cuff Size: Normal)    Pulse 80   Ht '6\' 1"'$  (1.854 m)   Wt 292 lb 12.8 oz (132.8 kg)   SpO2 92%   BMI 38.63 kg/m     Wt Readings from Last 3 Encounters:  08/27/22 292 lb 12.8 oz (132.8 kg)  07/28/22 292 lb 9.6 oz (132.7 kg)  05/27/22 291 lb 3.2 oz (132.1 kg)     GEN:  Well nourished, well developed in no acute distress HEENT: Normal NECK: No JVD; No carotid bruits CARDIAC: Irregular irregular, no murmurs RESPIRATORY:  Clear to auscultation without rales, wheezing or rhonchi  ABDOMEN: Soft, non-tender, non-distended MUSCULOSKELETAL:  No edema; No deformity  SKIN: Warm and dry NEUROLOGIC:  Alert and oriented  x 3 PSYCHIATRIC:  Normal affect   ASSESSMENT:    1. Atrial fibrillation, unspecified type ([place])   2. Primary hypertension   3. Pure hypercholesterolemia   4. BMI 38.0-38.9,adult     PLAN:    In order of problems listed above:  Persistent atrial fibrillation s/p DCCV 02/2021, RFA 04/2022.  EKG today shows atrial fibrillation, heart rate controlled CHA2DS2-VASc score 2 (htn, age).  He denies palpitations, shortness of breath.  Continue Eliquis, Lopressor 50 mg twice daily.  Schedule earlier appointment with EP for additional input. Hypertension, BP controlled.  Continue Lopressor, Zestoretic. Hyperlipidemia, cholesterol controlled.  Continue simvastatin Obesity, low calorie diet, weight loss advised.  Follow-up in 6 months.    Medication Adjustments/Labs and Tests Ordered: Current medicines are reviewed at length with the patient today.  Concerns regarding medicines are outlined above.  No orders of the defined types were placed in this encounter.   No orders of the defined types were placed in this encounter.    Patient Instructions  Medication Instructions:   Your physician recommends that you continue on your current medications as directed. Please refer to the Current Medication list given to you today.  *If you need a refill on your cardiac medications before your next  appointment, please call your pharmacy*    Follow-Up: At [facility], you and your health needs are our priority.  As part of our continuing mission to provide you with exceptional heart care, we have created designated Provider Care Teams.  These Care Teams include your primary Cardiologist (physician) and Advanced Practice Providers (APPs -  Physician Assistants and Nurse Practitioners) who all work together to provide you with the care you need, when you need it.  We recommend signing up for the patient portal called "MyChart".  Sign up information is provided on this After Visit Summary.  MyChart is used to connect with patients for Virtual Visits (Telemedicine).  Patients are able to view lab/test results, encounter notes, upcoming appointments, etc.  Non-urgent messages can be sent to your provider as well.   To learn more about what you can do with MyChart, go to [email or web address].    Your next appointment:   6 month(s)  The format for your next appointment:   In Person  Provider:   Kate Sable, MD    Other Instructions  ** Need to move up appointment with Dr. Quentin Ore for next available appointment  Important Information About Sugar         Signed, Kate Sable, MD  08/27/2022 12:26 PM    [place]

## 2022-08-27 NOTE — Patient Instructions (Addendum)
Medication Instructions:   Your physician recommends that you continue on your current medications as directed. Please refer to the Current Medication list given to you today.  *If you need a refill on your cardiac medications before your next appointment, please call your pharmacy*    Follow-Up: At [facility], you and your health needs are our priority.  As part of our continuing mission to provide you with exceptional heart care, we have created designated Provider Care Teams.  These Care Teams include your primary Cardiologist (physician) and Advanced Practice Providers (APPs -  Physician Assistants and Nurse Practitioners) who all work together to provide you with the care you need, when you need it.  We recommend signing up for the patient portal called "MyChart".  Sign up information is provided on this After Visit Summary.  MyChart is used to connect with patients for Virtual Visits (Telemedicine).  Patients are able to view lab/test results, encounter notes, upcoming appointments, etc.  Non-urgent messages can be sent to your provider as well.   To learn more about what you can do with MyChart, go to [email or web address].    Your next appointment:   6 month(s)  The format for your next appointment:   In Person  Provider:   Kate Sable, MD    Other Instructions  ** Need to move up appointment with Dr. Quentin Ore for next available appointment  Important Information About Sugar

## 2022-08-30 ENCOUNTER — Other Ambulatory Visit: Payer: Self-pay | Admitting: *Deleted

## 2022-08-30 MED ORDER — LISINOPRIL-HYDROCHLOROTHIAZIDE 20-12.5 MG PO TABS: 1.0000 | ORAL_TABLET | Freq: Two times a day (BID) | ORAL | 6 refills | Status: DC

## 2022-09-02 NOTE — Addendum Note (Signed)
Addended by: Nestor Ramp on: 09/02/2022 03:04 PM   Modules accepted: Orders

## 2022-09-29 ENCOUNTER — Other Ambulatory Visit
Admission: RE | Admit: 2022-09-29 | Discharge: 2022-09-29 | Disposition: A | Discharge status: Home / Self Care | Payer: BC Managed Care – PPO | Source: Ambulatory Visit | Attending: Cardiology | Admitting: Cardiology

## 2022-09-29 ENCOUNTER — Encounter: Payer: Self-pay | Admitting: Cardiology

## 2022-09-29 ENCOUNTER — Ambulatory Visit: Payer: BC Managed Care – PPO | Attending: Cardiology | Admitting: Cardiology

## 2022-09-29 VITALS — BP 120/82 | HR 72 | Ht 73.0 in | Wt 297.0 lb

## 2022-09-29 DIAGNOSIS — I10 Essential (primary) hypertension: Secondary | ICD-10-CM

## 2022-09-29 DIAGNOSIS — I5020 Unspecified systolic (congestive) heart failure: Secondary | ICD-10-CM | POA: Insufficient documentation

## 2022-09-29 DIAGNOSIS — Z01818 Encounter for other preprocedural examination: Secondary | ICD-10-CM

## 2022-09-29 DIAGNOSIS — I4819 Other persistent atrial fibrillation: Secondary | ICD-10-CM | POA: Diagnosis not present

## 2022-09-29 LAB — BASIC METABOLIC PANEL
Anion gap: 6 (ref 5–15)
BUN: 23 mg/dL (ref 8–23)
CO2: 31 mmol/L (ref 22–32)
Calcium: 9.3 mg/dL (ref 8.9–10.3)
Chloride: 101 mmol/L (ref 98–111)
Creatinine, Ser: 1.22 mg/dL (ref 0.61–1.24)
GFR, Estimated: >60 mL/min (ref >60)
Glucose, Bld: 111 mg/dL — ABNORMAL HIGH (ref 70–99)
Potassium: 5 mmol/L (ref 3.5–5.1)
Sodium: 138 mmol/L (ref 135–145)

## 2022-09-29 LAB — CBC
HCT: 51.2 % (ref 39.0–52.0)
Hemoglobin: 15 g/dL (ref 13.0–17.0)
MCH: 24.8 pg — ABNORMAL LOW (ref 26.0–34.0)
MCHC: 29.3 g/dL — ABNORMAL LOW (ref 30.0–36.0)
MCV: 84.6 fL (ref 80.0–100.0)
Platelets: 162 10*3/uL (ref 150–400)
RBC: 6.05 MIL/uL — ABNORMAL HIGH (ref 4.22–5.81)
RDW: 16.4 % — ABNORMAL HIGH (ref 11.5–15.5)
WBC: 9.5 10*3/uL (ref 4.0–10.5)
nRBC: 0 % (ref 0.0–0.2)

## 2022-09-29 NOTE — H&P (View-Only) (Signed)
Electrophysiology Office Follow up Visit Note:    Date:  09/29/2022   ID:  Jason Bolton, DOB [birth year], MRN [id number]  PCP:  Gwyneth Sprout, FNP  [facility] Cardiologist:  Kate Sable, MD  [facility] Electrophysiologist:  Vickie Epley, MD    Interval History:    Jason Bolton is a 66 y.o. male who presents for a follow up visit.  He had an A-fib ablation on April 29, 2022.  I saw him July 28, 2022.  He is feeling better back in sinus rhythm.  He has recovered ejection fraction of 55%.  He saw Dr. Garen Lah August 27, 2022 and was back in atrial fibrillation with a ventricular rate of 80.  During his ablation on April 29, 2022 the veins and posterior wall were ablated.  He felt great after the ablation with an improvement in his exercise tolerance and breathing.  He can tell that he went back in atrial fibrillation about 90 days after his ablation.  This was also confirmed with his [facility] device.  He checks his heart rhythm about twice per day and noticed the change about the 90-day mark after his ablation.     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   Polycythemia    Sleep apnea     Past Surgical History:  Procedure Laterality Date   ATRIAL FIBRILLATION ABLATION N/A 04/29/2022   Procedure: ATRIAL FIBRILLATION ABLATION;  Surgeon: Vickie Epley, MD;  Location: [facility] [place] [facility];  Service: Cardiovascular;  Laterality: N/A;   CARDIOVERSION N/A 03/04/2021   Procedure: CARDIOVERSION;  Surgeon: Kate Sable, MD;  Location: [facility];  Service: Cardiovascular;  Laterality: N/A;   CATARACT EXTRACTION Right 11/2013   COLONOSCOPY  2006   COLONOSCOPY WITH PROPOFOL N/A 10/19/2017   Procedure: COLONOSCOPY WITH PROPOFOL;  Surgeon: [name], MD;  Location: [facility];  Service: Endoscopy;  Laterality: N/A;    INGUINAL HERNIA REPAIR Right 1972   MANDIBLE SURGERY  2005   [facility]   PELVIC FRACTURE SURGERY  10/2021    Current Medications: Current Meds  Medication Sig   apixaban (ELIQUIS) 5 MG TABS tablet Take 1 tablet (5 mg total) by mouth 2 (two) times daily.   aspirin EC 81 MG tablet Take 81 mg by mouth daily. Swallow whole.   Coenzyme Q10 50 MG CAPS Take 50 mg by mouth every evening. CO ENZYME Q-10, '50MG'$  (Oral Capsule)  1 po qd for 0 days  Quantity: 30.00;  Refills: 0   Ordered :31-Aug-2010  Margarita Rana MD;  Started 31-Mar-2009 Active Comments: DX: 272.0   lisinopril-hydrochlorothiazide (ZESTORETIC) 20-12.5 MG tablet Take 1 tablet by mouth 2 (two) times daily.   metoprolol tartrate (LOPRESSOR) 50 MG tablet Take 1 tablet (50 mg total) by mouth 2 (two) times daily.   montelukast (SINGULAIR) 10 MG tablet Take 10 mg by mouth as needed.   pantoprazole (PROTONIX) 40 MG tablet Take 1 tablet (40 mg total) by mouth daily.   Polyethyl Glycol-Propyl Glycol (LUBRICANT EYE DROPS) 0.4-0.3 % SOLN Place 1-2 drops into both eyes 3 (three) times daily as needed (dry/irritated eyes).   sildenafil (VIAGRA) 50 MG tablet TAKE 1 TABLET BY MOUTH AS NEEDED FOR ERECTILE DYSFUNCTION GENERIC EQUIVALENT FOR VIAGRA   simvastatin (ZOCOR) 20 MG tablet Take 1 tablet (20 mg total) by mouth at bedtime.  tamsulosin (FLOMAX) 0.4 MG CAPS capsule TAKE 2 CAPSULES BY MOUTH DAILY GENERIC EQUIVALENT FOR FLOMAX   VENTOLIN HFA 108 (90 Base) MCG/ACT inhaler Inhale 2 puffs into the lungs every 4 (four) hours as needed for wheezing.     Allergies:   Penicillins   Social History   Socioeconomic History   Marital status: Married    Spouse name: Not on file   Number of children: 2   Years of education: Not on file   Highest education level: Not on file  Occupational History   Occupation: supervisor  Tobacco Use   Smoking status: Former    Packs/day: 1.50    Years: 20.00    Total pack years: 30.00    Types: Cigarettes    Quit date:  11/29/2015    Years since quitting: 6.8   Smokeless tobacco: Never   Tobacco comments:    quit June of 2008 after Chantix. he was exposed to secondary smoke  Vaping Use   Vaping Use: Never used  Substance and Sexual Activity   Alcohol use: Yes    Alcohol/week: 0.0 standard drinks of alcohol    Comment: occasional   Drug use: No   Sexual activity: Not on file  Other Topics Concern   Not on file  Social History Narrative   Not on file   Social Determinants of Health   Financial Resource Strain: Not on file  Food Insecurity: Not on file  Transportation Needs: Not on file  Physical Activity: Not on file  Stress: Not on file  Social Connections: Not on file     Family History: The patient's family history includes Congestive Heart Failure in his mother; Diabetes in his mother; Healthy in his brother, brother, brother, and sister; Pancreatic cancer in his father.  ROS:   Please see the history of present illness.    All other systems reviewed and are negative.  EKGs/Labs/Other Studies Reviewed:    The following studies were reviewed today:     Recent Labs: 02/16/2022: ALT 19 04/09/2022: Hemoglobin 17.0; Platelets 159 04/30/2022: BUN 18; Creatinine, Ser 0.80; Magnesium 1.9; Potassium 4.8; Sodium 137  Recent Lipid Panel    Component Value Date/Time   CHOL 122 01/14/2021 0913   TRIG 73 01/14/2021 0913   HDL 35 (L) 01/14/2021 0913   CHOLHDL 3.5 01/14/2021 0913   LDLCALC 72 01/14/2021 0913    Physical Exam:    VS:  BP 120/82   Pulse 72   Ht '6\' 1"'$  (1.854 m)   Wt 297 lb (134.7 kg)   BMI 39.18 kg/m     Wt Readings from Last 3 Encounters:  09/29/22 297 lb (134.7 kg)  08/27/22 292 lb 12.8 oz (132.8 kg)  07/28/22 292 lb 9.6 oz (132.7 kg)     GEN:  Well nourished, well developed in no acute distress.  Obese HEENT: Normal NECK: No JVD; No carotid bruits LYMPHATICS: No lymphadenopathy CARDIAC: Irregularly irregular, no murmurs, rubs, gallops RESPIRATORY:  Clear to  auscultation without rales, wheezing or rhonchi  ABDOMEN: Soft, non-tender, non-distended MUSCULOSKELETAL:  No edema; No deformity  SKIN: Warm and dry NEUROLOGIC:  Alert and oriented x 3 PSYCHIATRIC:  Normal affect        ASSESSMENT:    1. Persistent atrial fibrillation ([place])   2. Primary hypertension   3. HFrEF (heart failure with reduced ejection fraction) (HCC)    PLAN:    In order of problems listed above:  #Persistent atrial fibrillation Initially did well after his April 29, 2022 ablation with improvement in his symptoms while in normal rhythm.  Unfortunately he has returned to atrial fibrillation.  I discussed treatment options with the patient including arrhythmia drug therapy and redo catheter ablation.   I will schedule him a cardioversion first.  Hopefully he maintains rhythm now that he is healed from the ablation procedure.  If he has recurrence after cardioversion, would schedule him for redo ablation.  He has not missed any doses of his Eliquis for at least 4 weeks.  I discussed the cardioversion procedure in detail including the risks and he wishes to proceed.  Discussed treatment options today for their AF including antiarrhythmic drug therapy and ablation. Discussed risks, recovery and likelihood of success. Discussed potential need for repeat ablation procedures and antiarrhythmic drugs after an ablation. They wish to proceed with scheduling if he has recurrence of A-fib after his cardioversion.  Risk, benefits, and alternatives to EP study and radiofrequency ablation for afib were also discussed in detail today. These risks include but are not limited to stroke, bleeding, vascular damage, tamponade, perforation, damage to the esophagus, lungs, and other structures, pulmonary vein stenosis, worsening renal function, and death. The patient understands these risk and wishes to proceed if necessary after the cardioversion.  We will therefore proceed with catheter ablation at  the next available time.  Carto, ICE, anesthesia are requested for the procedure.  Will also obtain CT PV protocol prior to the procedure to exclude LAA thrombus and further evaluate atrial anatomy.   #Chronic systolic heart failure NYHA class II-III.  EF 55%.  Continue metoprolol, lisinopril.  #Hypertension At goal today.  Recommend checking blood pressures 1-2 times per week at home and recording the values.  Recommend bringing these recordings to the primary care physician.     Medication Adjustments/Labs and Tests Ordered: Current medicines are reviewed at length with the patient today.  Concerns regarding medicines are outlined above.  No orders of the defined types were placed in this encounter.  No orders of the defined types were placed in this encounter.    Signed, Lars Mage, MD, [facility], [facility] 09/29/2022 9:49 AM    Electrophysiology [facility]

## 2022-09-29 NOTE — Progress Notes (Signed)
Electrophysiology Office Follow up Visit Note:    Date:  09/29/2022   ID:  Jason Bolton, DOB 1956/08/24, MRN [id number]  PCP:  Gwyneth Sprout, FNP  [facility] Cardiologist:  Kate Sable, MD  [facility] Electrophysiologist:  Vickie Epley, MD    Interval History:    Jason Bolton is a 66 y.o. male who presents for a follow up visit.  He had an A-fib ablation on April 29, 2022.  I saw him July 28, 2022.  He is feeling better back in sinus rhythm.  He has recovered ejection fraction of 55%.  He saw Dr. Garen Lah August 27, 2022 and was back in atrial fibrillation with a ventricular rate of 80.  During his ablation on April 29, 2022 the veins and posterior wall were ablated.  He felt great after the ablation with an improvement in his exercise tolerance and breathing.  He can tell that he went back in atrial fibrillation about 90 days after his ablation.  This was also confirmed with his [facility] device.  He checks his heart rhythm about twice per day and noticed the change about the 90-day mark after his ablation.     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   Polycythemia    Sleep apnea     Past Surgical History:  Procedure Laterality Date   ATRIAL FIBRILLATION ABLATION N/A 04/29/2022   Procedure: ATRIAL FIBRILLATION ABLATION;  Surgeon: Vickie Epley, MD;  Location: [facility];  Service: Cardiovascular;  Laterality: N/A;   CARDIOVERSION N/A 03/04/2021   Procedure: CARDIOVERSION;  Surgeon: Kate Sable, MD;  Location: [facility] OR[facility];  Service: Cardiovascular;  Laterality: N/A;   CATARACT EXTRACTION Right 11/2013   COLONOSCOPY  2006   COLONOSCOPY WITH PROPOFOL N/A 10/19/2017   Procedure: COLONOSCOPY WITH PROPOFOL;  Surgeon: [name], MD;  Location: [facility] ENDOSCOPY;  Service: Endoscopy;  Laterality: N/A;    INGUINAL HERNIA REPAIR Right 1972   MANDIBLE SURGERY  2005   [facility]   PELVIC FRACTURE SURGERY  10/2021    Current Medications: Current Meds  Medication Sig   apixaban (ELIQUIS) 5 MG TABS tablet Take 1 tablet (5 mg total) by mouth 2 (two) times daily.   aspirin EC 81 MG tablet Take 81 mg by mouth daily. Swallow whole.   Coenzyme Q10 50 MG CAPS Take 50 mg by mouth every evening. CO ENZYME Q-10, '50MG'$  (Oral Capsule)  1 po qd for 0 days  Quantity: 30.00;  Refills: 0   Ordered :31-Aug-2010  Margarita Rana MD;  Started 31-Mar-2009 Active Comments: DX: 272.0   lisinopril-hydrochlorothiazide (ZESTORETIC) 20-12.5 MG tablet Take 1 tablet by mouth 2 (two) times daily.   metoprolol tartrate (LOPRESSOR) 50 MG tablet Take 1 tablet (50 mg total) by mouth 2 (two) times daily.   montelukast (SINGULAIR) 10 MG tablet Take 10 mg by mouth as needed.   pantoprazole (PROTONIX) 40 MG tablet Take 1 tablet (40 mg total) by mouth daily.   Polyethyl Glycol-Propyl Glycol (LUBRICANT EYE DROPS) 0.4-0.3 % SOLN Place 1-2 drops into both eyes 3 (three) times daily as needed (dry/irritated eyes).   sildenafil (VIAGRA) 50 MG tablet TAKE 1 TABLET BY MOUTH AS NEEDED FOR ERECTILE DYSFUNCTION GENERIC EQUIVALENT FOR VIAGRA   simvastatin (ZOCOR) 20 MG tablet Take 1 tablet (20 mg total) by mouth at bedtime.  tamsulosin (FLOMAX) 0.4 MG CAPS capsule TAKE 2 CAPSULES BY MOUTH DAILY GENERIC EQUIVALENT FOR FLOMAX   VENTOLIN HFA 108 (90 Base) MCG/ACT inhaler Inhale 2 puffs into the lungs every 4 (four) hours as needed for wheezing.     Allergies:   Penicillins   Social History   Socioeconomic History   Marital status: Married    Spouse name: Not on file   Number of children: 2   Years of education: Not on file   Highest education level: Not on file  Occupational History   Occupation: supervisor  Tobacco Use   Smoking status: Former    Packs/day: 1.50    Years: 20.00    Total pack years: 30.00    Types: Cigarettes    Quit date:  11/29/2015    Years since quitting: 6.8   Smokeless tobacco: Never   Tobacco comments:    quit June of 2008 after [employer]. he was exposed to secondary smoke  Vaping Use   Vaping Use: Never used  Substance and Sexual Activity   Alcohol use: Yes    Alcohol/week: 0.0 standard drinks of alcohol    Comment: occasional   Drug use: No   Sexual activity: Not on file  Other Topics Concern   Not on file  Social History Narrative   Not on file   Social Determinants of Health   Financial Resource Strain: Not on file  Food Insecurity: Not on file  Transportation Needs: Not on file  Physical Activity: Not on file  Stress: Not on file  Social Connections: Not on file     Family History: The patient's family history includes Congestive Heart Failure in his mother; Diabetes in his mother; Healthy in his brother, brother, brother, and sister; Pancreatic cancer in his father.  ROS:   Please see the history of present illness.    All other systems reviewed and are negative.  EKGs/Labs/Other Studies Reviewed:    The following studies were reviewed today:     Recent Labs: 02/16/2022: ALT 19 04/09/2022: Hemoglobin 17.0; Platelets 159 04/30/2022: BUN 18; Creatinine, Ser 0.80; Magnesium 1.9; Potassium 4.8; Sodium 137  Recent Lipid Panel    Component Value Date/Time   CHOL 122 01/14/2021 0913   TRIG 73 01/14/2021 0913   HDL 35 (L) 01/14/2021 0913   CHOLHDL 3.5 01/14/2021 0913   LDLCALC 72 01/14/2021 0913    Physical Exam:    VS:  BP 120/82   Pulse 72   Ht '6\' 1"'$  (1.854 m)   Wt 297 lb (134.7 kg)   BMI 39.18 kg/m     Wt Readings from Last 3 Encounters:  09/29/22 297 lb (134.7 kg)  08/27/22 292 lb 12.8 oz (132.8 kg)  07/28/22 292 lb 9.6 oz (132.7 kg)     GEN:  Well nourished, well developed in no acute distress.  Obese HEENT: Normal NECK: No JVD; No carotid bruits LYMPHATICS: No lymphadenopathy CARDIAC: Irregularly irregular, no murmurs, rubs, gallops RESPIRATORY:  Clear to  auscultation without rales, wheezing or rhonchi  ABDOMEN: Soft, non-tender, non-distended MUSCULOSKELETAL:  No edema; No deformity  SKIN: Warm and dry NEUROLOGIC:  Alert and oriented x 3 PSYCHIATRIC:  Normal affect        ASSESSMENT:    1. Persistent atrial fibrillation ([place])   2. Primary hypertension   3. HFrEF (heart failure with reduced ejection fraction) (HCC)    PLAN:    In order of problems listed above:  #Persistent atrial fibrillation Initially did well after his April 29, 2022 ablation with improvement in his symptoms while in normal rhythm.  Unfortunately he has returned to atrial fibrillation.  I discussed treatment options with the patient including arrhythmia drug therapy and redo catheter ablation.   I will schedule him a cardioversion first.  Hopefully he maintains rhythm now that he is healed from the ablation procedure.  If he has recurrence after cardioversion, would schedule him for redo ablation.  He has not missed any doses of his Eliquis for at least 4 weeks.  I discussed the cardioversion procedure in detail including the risks and he wishes to proceed.  Discussed treatment options today for their AF including antiarrhythmic drug therapy and ablation. Discussed risks, recovery and likelihood of success. Discussed potential need for repeat ablation procedures and antiarrhythmic drugs after an ablation. They wish to proceed with scheduling if he has recurrence of A-fib after his cardioversion.  Risk, benefits, and alternatives to EP study and radiofrequency ablation for afib were also discussed in detail today. These risks include but are not limited to stroke, bleeding, vascular damage, tamponade, perforation, damage to the esophagus, lungs, and other structures, pulmonary vein stenosis, worsening renal function, and death. The patient understands these risk and wishes to proceed if necessary after the cardioversion.  We will therefore proceed with catheter ablation at  the next available time.  Carto, ICE, anesthesia are requested for the procedure.  Will also obtain CT PV protocol prior to the procedure to exclude LAA thrombus and further evaluate atrial anatomy.   #Chronic systolic heart failure NYHA class II-III.  EF 55%.  Continue metoprolol, lisinopril.  #Hypertension At goal today.  Recommend checking blood pressures 1-2 times per week at home and recording the values.  Recommend bringing these recordings to the primary care physician.     Medication Adjustments/Labs and Tests Ordered: Current medicines are reviewed at length with the patient today.  Concerns regarding medicines are outlined above.  No orders of the defined types were placed in this encounter.  No orders of the defined types were placed in this encounter.    Signed, Lars Mage, MD, [facility], [facility] 09/29/2022 9:49 AM    Electrophysiology [facility]

## 2022-09-29 NOTE — Patient Instructions (Addendum)
Medication Instructions:  No changes *If you need a refill on your cardiac medications before your next appointment, please call your pharmacy*   Lab Work: none If you have labs (blood work) drawn today and your tests are completely normal, you will receive your results only by: [place] (if you have MyChart) OR A paper copy in the mail If you have any lab test that is abnormal or we need to change your treatment, we will call you to review the results.   Testing/Procedures: Your physician has recommended that you have a Cardioversion (DCCV). Electrical Cardioversion uses a jolt of electricity to your heart either through paddles or wired patches attached to your chest. This is a controlled, usually prescheduled, procedure. Defibrillation is done under light anesthesia in the hospital, and you usually go home the day of the procedure. This is done to get your heart back into a normal rhythm. You are not awake for the procedure. Please see the instruction sheet given to you today.  Follow-Up: At [facility], you and your health needs are our priority.  As part of our continuing mission to provide you with exceptional heart care, we have created designated Provider Care Teams.  These Care Teams include your primary Cardiologist (physician) and Advanced Practice Providers (APPs -  Physician Assistants and Nurse Practitioners) who all work together to provide you with the care you need, when you need it.  We recommend signing up for the patient portal called "MyChart".  Sign up information is provided on this After Visit Summary.  MyChart is used to connect with patients for Virtual Visits (Telemedicine).  Patients are able to view lab/test results, encounter notes, upcoming appointments, etc.  Non-urgent messages can be sent to your provider as well.   To learn more about what you can do with MyChart, go to [email or web address].    Your next appointment:   6-8 week(s)  The  format for your next appointment:   In Person  Provider:   Lars Mage, MD    Other Instructions  You are scheduled for a Cardioversion on October 04, 2022 with Dr. Fletcher Anon Please arrive at the [place] [facility] at 6:30 a.m. on the day of your procedure.  DIET INSTRUCTIONS:  Nothing to eat or drink after midnight except your medications with sip of water.  Labs: Nov 1  [place] Entrance at [facility] 1st desk on the right to check in Lab hours: 7:30 am- 5:30 pm (walk in basis)  Medications:  YOU MAY TAKE ALL of your remaining medications with a small amount of water.  Must have a responsible person to drive you home.  Bring a current list of your medications and current insurance cards.    If you have any questions after you get home, please call the office at [place]

## 2022-10-01 NOTE — Anesthesia Preprocedure Evaluation (Signed)
Anesthesia Evaluation  Patient identified by MRN, date of birth, ID band Patient awake    Reviewed: Allergy & Precautions, NPO status , Patient's Chart, lab work & pertinent test results  Airway Mallampati: III  TM Distance: >3 FB Neck ROM: full    Dental  (+) Chipped   Pulmonary sleep apnea , former smoker   Pulmonary exam normal        Cardiovascular hypertension, + dysrhythmias Atrial Fibrillation   Abdominal aortic aneurysm (AAA) without rupture   Neuro/Psych negative neurological ROS  negative psych ROS   GI/Hepatic negative GI ROS, Neg liver ROS,,,  Endo/Other  negative endocrine ROS    Renal/GU negative Renal ROS  negative genitourinary   Musculoskeletal   Abdominal   Peds  Hematology negative hematology ROS (+)   Anesthesia Other Findings Past Medical History: No date: Allergy No date: Hypercholesterolemia No date: Hypertension No date: Personal history of osteomyelitis     Comment:  of the jaw- 2005-mandibular surgery No date: Polycythemia No date: Sleep apnea  Past Surgical History: 04/29/2022: ATRIAL FIBRILLATION ABLATION; N/A     Comment:  Procedure: ATRIAL FIBRILLATION ABLATION;  Surgeon:               Vickie Epley, MD;  Location: [place] [facility];                Service: Cardiovascular;  Laterality: N/A; 03/04/2021: CARDIOVERSION; N/A     Comment:  Procedure: CARDIOVERSION;  Surgeon: Kate Sable,               MD;  Location: [facility] ORS;  Service: Cardiovascular;                Laterality: N/A; 11/2013: CATARACT EXTRACTION; Right 2006: COLONOSCOPY 10/19/2017: COLONOSCOPY WITH PROPOFOL; N/A     Comment:  Procedure: COLONOSCOPY WITH PROPOFOL;  Surgeon: [name], MD;  Location: [facility] ENDOSCOPY;  Service:               Endoscopy;  Laterality: N/A; 1972: INGUINAL HERNIA REPAIR; Right 2005: MANDIBLE SURGERY     Comment:  [facility] 10/2021: PELVIC FRACTURE  SURGERY     Reproductive/Obstetrics negative OB ROS                             Anesthesia Physical Anesthesia Plan  ASA: 3  Anesthesia Plan: General   Post-op Pain Management:    Induction:   PONV Risk Score and Plan: Propofol infusion and TIVA  Airway Management Planned: Natural Airway  Additional Equipment:   Intra-op Plan:   Post-operative Plan:   Informed Consent:      Dental Advisory Given  Plan Discussed with: Anesthesiologist, CRNA and Surgeon  Anesthesia Plan Comments:        Anesthesia Quick Evaluation

## 2022-10-04 ENCOUNTER — Ambulatory Visit: Payer: BC Managed Care – PPO | Admitting: Anesthesiology

## 2022-10-04 ENCOUNTER — Encounter: Payer: Self-pay | Admitting: Cardiovascular Disease

## 2022-10-04 ENCOUNTER — Encounter
Admission: RE | Disposition: A | Discharge status: Home / Self Care | Payer: Self-pay | Source: Home / Self Care | Attending: Cardiovascular Disease

## 2022-10-04 ENCOUNTER — Ambulatory Visit
Admission: RE | Admit: 2022-10-04 | Discharge: 2022-10-04 | Disposition: A | Discharge status: Home / Self Care | Payer: BC Managed Care – PPO | Attending: Cardiovascular Disease | Admitting: Cardiovascular Disease

## 2022-10-04 DIAGNOSIS — Z87891 Personal history of nicotine dependence: Secondary | ICD-10-CM | POA: Diagnosis not present

## 2022-10-04 DIAGNOSIS — Z01810 Encounter for preprocedural cardiovascular examination: Secondary | ICD-10-CM | POA: Diagnosis not present

## 2022-10-04 DIAGNOSIS — E7800 Pure hypercholesterolemia, unspecified: Secondary | ICD-10-CM | POA: Diagnosis not present

## 2022-10-04 DIAGNOSIS — G4730 Sleep apnea, unspecified: Secondary | ICD-10-CM | POA: Diagnosis not present

## 2022-10-04 DIAGNOSIS — R0902 Hypoxemia: Secondary | ICD-10-CM | POA: Diagnosis not present

## 2022-10-04 DIAGNOSIS — Z79899 Other long term (current) drug therapy: Secondary | ICD-10-CM | POA: Insufficient documentation

## 2022-10-04 DIAGNOSIS — I4819 Other persistent atrial fibrillation: Secondary | ICD-10-CM | POA: Insufficient documentation

## 2022-10-04 DIAGNOSIS — I714 Abdominal aortic aneurysm, without rupture, unspecified: Secondary | ICD-10-CM | POA: Insufficient documentation

## 2022-10-04 DIAGNOSIS — I110 Hypertensive heart disease with heart failure: Secondary | ICD-10-CM | POA: Diagnosis not present

## 2022-10-04 DIAGNOSIS — I4891 Unspecified atrial fibrillation: Secondary | ICD-10-CM | POA: Diagnosis not present

## 2022-10-04 DIAGNOSIS — I5022 Chronic systolic (congestive) heart failure: Secondary | ICD-10-CM | POA: Insufficient documentation

## 2022-10-04 DIAGNOSIS — I340 Nonrheumatic mitral (valve) insufficiency: Secondary | ICD-10-CM | POA: Diagnosis not present

## 2022-10-04 DIAGNOSIS — I10 Essential (primary) hypertension: Secondary | ICD-10-CM | POA: Diagnosis not present

## 2022-10-04 DIAGNOSIS — Z01818 Encounter for other preprocedural examination: Secondary | ICD-10-CM

## 2022-10-04 HISTORY — PX: CARDIOVERSION: SHX1299

## 2022-10-04 HISTORY — DX: Cardiac arrhythmia, unspecified: I49.9

## 2022-10-04 SURGERY — CARDIOVERSION
Anesthesia: General

## 2022-10-04 MED ORDER — PROPOFOL 1000 MG/100ML IV EMUL
INTRAVENOUS | Status: AC
  Filled 2022-10-04: qty 100

## 2022-10-04 MED ORDER — SODIUM CHLORIDE 0.9 % IV SOLN: INTRAVENOUS | Status: DC

## 2022-10-04 MED ORDER — PROPOFOL 10 MG/ML IV BOLUS
INTRAVENOUS | Status: DC | PRN
  Administered 2022-10-04: 60 mg via INTRAVENOUS

## 2022-10-04 NOTE — Anesthesia Postprocedure Evaluation (Signed)
Anesthesia Post Note  Patient: Jason Bolton  Procedure(s) Performed: CARDIOVERSION  Patient location during evaluation: PACU Anesthesia Type: General Level of consciousness: awake and alert Pain management: pain level controlled Vital Signs Assessment: post-procedure vital signs reviewed and stable Respiratory status: spontaneous breathing, nonlabored ventilation and respiratory function stable Cardiovascular status: blood pressure returned to baseline and stable Postop Assessment: no apparent nausea or vomiting Anesthetic complications: no   No notable events documented.   Last Vitals:  Vitals:   10/04/22 0815 10/04/22 0830  BP: (!) 80/61 104/71  Pulse: 67 72  Resp: 20 12  Temp:    SpO2: 91% 91%    Last Pain:  Vitals:   10/04/22 0830  TempSrc:   PainSc: 0-No pain                 [place] Ouch

## 2022-10-04 NOTE — Interval H&P Note (Signed)
History and Physical Interval Note:  10/04/2022 8:35 AM  Jason Bolton  has presented today for surgery, with the diagnosis of Cardioversion   Afib.  The various methods of treatment have been discussed with the patient and family. After consideration of risks, benefits and other options for treatment, the patient has consented to  Procedure(s): CARDIOVERSION (N/A) as a surgical intervention.  The patient's history has been reviewed, patient examined, no change in status, stable for surgery.  I have reviewed the patient's chart and labs.  Questions were answered to the patient's satisfaction.     [name]

## 2022-10-04 NOTE — CV Procedure (Signed)
Cardioversion note: A standard informed consent was obtained. Timeout was performed. The pads were placed in the anterior posterior fashion. The patient was given propofol by the anesthesia team.  Cardioversion was performed twice with 200 J synchronized shock.  After each shock, the patient had 2 to 3-second pause and possibly few sinus beats but went quickly into atrial fibrillation. He had significant hypoxia that improved with supplemental oxygen and adjusting his airways. Pre-and post EKGs were reviewed. The patient tolerated the procedure with no immediate complications.  Recommendations: We will forward to Dr. Quentin Ore to decide whether an antiarrhythmic medication or repeat ablation is needed.

## 2022-10-04 NOTE — Transfer of Care (Signed)
Immediate Anesthesia Transfer of Care Note  Patient: Jason Bolton  Procedure(s) Performed: CARDIOVERSION  Patient Location: Short Stay  Anesthesia Type:General  Level of Consciousness: awake, alert , and oriented  Airway & Oxygen Therapy: Patient Spontanous Breathing and Patient connected to face mask oxygen  Post-op Assessment: Report given to RN and Post -op Vital signs reviewed and stable  Post vital signs: Reviewed and stable  Last Vitals:  Vitals Value Taken Time  BP 107/55 10/04/22 0750  Temp    Pulse 69 10/04/22 0752  Resp 29 10/04/22 0752  SpO2 98 % 10/04/22 0752    Last Pain:  Vitals:   10/04/22 0726  TempSrc: Oral  PainSc: 0-No pain         Complications: No notable events documented.

## 2022-10-05 ENCOUNTER — Encounter: Payer: Self-pay | Admitting: Cardiology

## 2022-10-05 ENCOUNTER — Other Ambulatory Visit: Payer: Self-pay

## 2022-10-05 ENCOUNTER — Telehealth: Payer: Self-pay | Admitting: Cardiology

## 2022-10-05 DIAGNOSIS — Z01818 Encounter for other preprocedural examination: Secondary | ICD-10-CM

## 2022-10-05 DIAGNOSIS — I4819 Other persistent atrial fibrillation: Secondary | ICD-10-CM

## 2022-10-05 NOTE — Telephone Encounter (Signed)
Jason Bolton,  His cardioversion did not work. Please schedule him for repeat ablation procedure.  Thanks,  Lars Mage

## 2022-10-05 NOTE — Telephone Encounter (Signed)
New Message:     Patient said he had a Cardioversion yesterday. He was supposed to let Dr Quentin Ore know how it went. He wants Dr Quentin Ore to know that the Cardioversion did not work yesterday(10-04-22). He wants to know what does Dr Quentin Ore want him to do next?t

## 2022-10-05 NOTE — Telephone Encounter (Signed)
The patient picked ablation date for February 16 and [facility]. The patient requested to get CT and Ablation instructions over mychart and mailed.

## 2022-10-11 DIAGNOSIS — D751 Secondary polycythemia: Secondary | ICD-10-CM | POA: Diagnosis not present

## 2022-11-10 ENCOUNTER — Ambulatory Visit: Payer: BC Managed Care – PPO | Admitting: Cardiology

## 2022-11-15 ENCOUNTER — Other Ambulatory Visit: Payer: Self-pay | Admitting: Medical

## 2022-11-19 DIAGNOSIS — D751 Secondary polycythemia: Secondary | ICD-10-CM | POA: Diagnosis not present

## 2022-12-20 DIAGNOSIS — Z0000 Encounter for general adult medical examination without abnormal findings: Secondary | ICD-10-CM | POA: Diagnosis not present

## 2022-12-20 DIAGNOSIS — D751 Secondary polycythemia: Secondary | ICD-10-CM | POA: Diagnosis not present

## 2022-12-20 DIAGNOSIS — I10 Essential (primary) hypertension: Secondary | ICD-10-CM | POA: Diagnosis not present

## 2022-12-21 NOTE — Progress Notes (Unsigned)
Electrophysiology Office Follow up Visit Note:    Date:  12/22/2022   ID:  Jason Bolton, DOB 11-Nov-1956, MRN [id number]  PCP:  Patient, No Pcp Per  [place] Cardiologist:  Kate Sable, MD  [facility] Electrophysiologist:  Vickie Epley, MD    Interval History:    Jason Bolton is a 67 y.o. male who presents for a follow up visit. They were last seen in clinic September 29, 2022 for persistent atrial fibrillation.  He had a prior A-fib ablation on April 29, 2022.  He felt significantly better after his ablation.  During the prior ablation the veins and posterior wall were targeted.  When I saw him last in November, repeat ablation was planned after an interim cardioversion.  Cardioversion was performed and he has since returned to atrial fibrillation.  He is symptomatic. His primary doc is planning to start a weight loss medication.     Past Medical History:  Diagnosis Date   Allergy    Dysrhythmia    Hypercholesterolemia    Hypertension    Personal history of osteomyelitis    of the jaw- 2005-mandibular surgery   Polycythemia    Sleep apnea     Past Surgical History:  Procedure Laterality Date   ATRIAL FIBRILLATION ABLATION N/A 04/29/2022   Procedure: ATRIAL FIBRILLATION ABLATION;  Surgeon: Vickie Epley, MD;  Location: [place] [facility];  Service: Cardiovascular;  Laterality: N/A;   CARDIOVERSION N/A 03/04/2021   Procedure: CARDIOVERSION;  Surgeon: Kate Sable, MD;  Location: [facility];  Service: Cardiovascular;  Laterality: N/A;   CARDIOVERSION N/A 10/04/2022   Procedure: CARDIOVERSION;  Surgeon: Wellington Hampshire, MD;  Location: [facility];  Service: Cardiovascular;  Laterality: N/A;   CATARACT EXTRACTION Right 11/2013   COLONOSCOPY  2006   COLONOSCOPY WITH PROPOFOL N/A 10/19/2017   Procedure: COLONOSCOPY WITH PROPOFOL;  Surgeon: [name], MD;  Location: [facility];  Service: Endoscopy;  Laterality: N/A;   INGUINAL HERNIA REPAIR  Right 1972   MANDIBLE SURGERY  2005   [facility]   PELVIC FRACTURE SURGERY  10/2021    Current Medications: Current Meds  Medication Sig   apixaban (ELIQUIS) 5 MG TABS tablet Take 1 tablet (5 mg total) by mouth 2 (two) times daily.   aspirin EC 81 MG tablet Take 81 mg by mouth daily. Swallow whole.   Coenzyme Q10 50 MG CAPS Take 50 mg by mouth every evening. CO ENZYME Q-10, 50MG (Oral Capsule)  1 po qd for 0 days  Quantity: 30.00;  Refills: 0   Ordered :31-Aug-2010  Margarita Rana MD;  Started 31-Mar-2009 Active Comments: DX: 272.0   lisinopril-hydrochlorothiazide (ZESTORETIC) 20-12.5 MG tablet Take 1 tablet by mouth 2 (two) times daily.   metoprolol tartrate (LOPRESSOR) 50 MG tablet Take 1 tablet (50 mg total) by mouth 2 (two) times daily.   montelukast (SINGULAIR) 10 MG tablet Take 10 mg by mouth as needed.   pantoprazole (PROTONIX) 40 MG tablet Take 1 tablet (40 mg total) by mouth daily.   Polyethyl Glycol-Propyl Glycol (LUBRICANT EYE DROPS) 0.4-0.3 % SOLN Place 1-2 drops into both eyes 3 (three) times daily as needed (dry/irritated eyes).   sildenafil (VIAGRA) 50 MG tablet TAKE 1 TABLET BY MOUTH AS NEEDED FOR ERECTILE DYSFUNCTION GENERIC EQUIVALENT FOR VIAGRA   simvastatin (ZOCOR) 20 MG tablet Take 1 tablet (20 mg total) by mouth at bedtime.   tamsulosin (FLOMAX) 0.4 MG CAPS capsule TAKE 2 CAPSULES BY MOUTH DAILY GENERIC EQUIVALENT FOR FLOMAX  VENTOLIN HFA 108 (90 Base) MCG/ACT inhaler Inhale 2 puffs into the lungs every 4 (four) hours as needed for wheezing.     Allergies:   Penicillins   Social History   Socioeconomic History   Marital status: Married    Spouse name: Not on file   Number of children: 2   Years of education: Not on file   Highest education level: Not on file  Occupational History   Occupation: supervisor  Tobacco Use   Smoking status: Former    Packs/day: 1.50    Years: 20.00    Total pack years: 30.00    Types: Cigarettes    Quit date: 11/29/2015    Years  since quitting: 7.0   Smokeless tobacco: Never   Tobacco comments:    quit June of 2008 after Chantix. he was exposed to secondary smoke  Vaping Use   Vaping Use: Never used  Substance and Sexual Activity   Alcohol use: Yes    Alcohol/week: 0.0 standard drinks of alcohol    Comment: occasional   Drug use: No   Sexual activity: Not on file  Other Topics Concern   Not on file  Social History Narrative   Not on file   Social Determinants of Health   Financial Resource Strain: Not on file  Food Insecurity: Not on file  Transportation Needs: Not on file  Physical Activity: Not on file  Stress: Not on file  Social Connections: Not on file     Family History: The patient's family history includes Congestive Heart Failure in his mother; Diabetes in his mother; Healthy in his brother, brother, brother, and sister; Pancreatic cancer in his father.  ROS:   Please see the history of present illness.    All other systems reviewed and are negative.  EKGs/Labs/Other Studies Reviewed:    The following studies were reviewed today:     Recent Labs: 02/16/2022: ALT 19 04/30/2022: Magnesium 1.9 09/29/2022: BUN 23; Creatinine, Ser 1.22; Hemoglobin 15.0; Platelets 162; Potassium 5.0; Sodium 138  Recent Lipid Panel    Component Value Date/Time   CHOL 122 01/14/2021 0913   TRIG 73 01/14/2021 0913   HDL 35 (L) 01/14/2021 0913   CHOLHDL 3.5 01/14/2021 0913   LDLCALC 72 01/14/2021 0913    Physical Exam:    VS:  BP 120/76   Pulse 72   Ht 6' 1"$  (1.854 m)   Wt 299 lb (135.6 kg)   SpO2 96%   BMI 39.45 kg/m     Wt Readings from Last 3 Encounters:  12/22/22 299 lb (135.6 kg)  10/04/22 295 lb (133.8 kg)  09/29/22 297 lb (134.7 kg)     GEN:  Well nourished, well developed in no acute distress CARDIAC: irregularly irregular, no murmurs, rubs, gallops       ASSESSMENT:    1. Persistent atrial fibrillation (HCC)   2. HFrEF (heart failure with reduced ejection fraction) ([place])    3. Primary hypertension    PLAN:    In order of problems listed above:   #Persistent atrial fibrillation Symptomatic.  Has had recurrence after his initial ablation. He is scheduled for redo catheter ablation January 14, 2023. Continue Eliquis for stroke prophylaxis  Discussed treatment options today for their AF including antiarrhythmic drug therapy and ablation. Discussed risks, recovery and likelihood of success. Discussed potential need for repeat ablation procedures and antiarrhythmic drugs after an initial ablation. They wish to proceed with scheduling.  Risk, benefits, and alternatives to EP study and  radiofrequency ablation for afib were also discussed in detail today. These risks include but are not limited to stroke, bleeding, vascular damage, tamponade, perforation, damage to the esophagus, lungs, and other structures, pulmonary vein stenosis, worsening renal function, and death. The patient understands these risk and wishes to proceed.  We will therefore proceed with catheter ablation at the next available time.  Carto, ICE, anesthesia are requested for the procedure.  Will also obtain CT PV protocol prior to the procedure to exclude LAA thrombus and further evaluate atrial anatomy.   #Chronic systolic heart failure NYHA class II-III.  Warm dry on exam.  Rhythm control important.  Continue GDMT.  #Hypertension At goal today.  Recommend checking blood pressures 1-2 times per week at home and recording the values.  Recommend bringing these recordings to the primary care physician.  #Obesity Primary care planning to start a weight loss drug which I think is a good idea.  We discussed the link between obesity and atrial fibrillation during today's clinic appointment.   Medication Adjustments/Labs and Tests Ordered: Current medicines are reviewed at length with the patient today.  Concerns regarding medicines are outlined above.  No orders of the defined types were placed in  this encounter.  No orders of the defined types were placed in this encounter.    Signed, Lars Mage, MD, [place], [facility] 12/22/2022 10:45 AM    Electrophysiology [facility]

## 2022-12-21 NOTE — H&P (View-Only) (Signed)
Electrophysiology Office Follow up Visit Note:    Date:  12/22/2022   ID:  Jason Bolton, DOB 11-Nov-1956, MRN [id number]  PCP:  Patient, No Pcp Per  [place] Cardiologist:  Kate Sable, MD  [facility] Electrophysiologist:  Vickie Epley, MD    Interval History:    Jason Bolton is a 67 y.o. male who presents for a follow up visit. They were last seen in clinic September 29, 2022 for persistent atrial fibrillation.  He had a prior A-fib ablation on April 29, 2022.  He felt significantly better after his ablation.  During the prior ablation the veins and posterior wall were targeted.  When I saw him last in November, repeat ablation was planned after an interim cardioversion.  Cardioversion was performed and he has since returned to atrial fibrillation.  He is symptomatic. His primary doc is planning to start a weight loss medication.     Past Medical History:  Diagnosis Date   Allergy    Dysrhythmia    Hypercholesterolemia    Hypertension    Personal history of osteomyelitis    of the jaw- 2005-mandibular surgery   Polycythemia    Sleep apnea     Past Surgical History:  Procedure Laterality Date   ATRIAL FIBRILLATION ABLATION N/A 04/29/2022   Procedure: ATRIAL FIBRILLATION ABLATION;  Surgeon: Vickie Epley, MD;  Location: [place] [facility];  Service: Cardiovascular;  Laterality: N/A;   CARDIOVERSION N/A 03/04/2021   Procedure: CARDIOVERSION;  Surgeon: Kate Sable, MD;  Location: [facility];  Service: Cardiovascular;  Laterality: N/A;   CARDIOVERSION N/A 10/04/2022   Procedure: CARDIOVERSION;  Surgeon: Wellington Hampshire, MD;  Location: [facility];  Service: Cardiovascular;  Laterality: N/A;   CATARACT EXTRACTION Right 11/2013   COLONOSCOPY  2006   COLONOSCOPY WITH PROPOFOL N/A 10/19/2017   Procedure: COLONOSCOPY WITH PROPOFOL;  Surgeon: [name], MD;  Location: [facility];  Service: Endoscopy;  Laterality: N/A;   INGUINAL HERNIA REPAIR  Right 1972   MANDIBLE SURGERY  2005   [facility]   PELVIC FRACTURE SURGERY  10/2021    Current Medications: Current Meds  Medication Sig   apixaban (ELIQUIS) 5 MG TABS tablet Take 1 tablet (5 mg total) by mouth 2 (two) times daily.   aspirin EC 81 MG tablet Take 81 mg by mouth daily. Swallow whole.   Coenzyme Q10 50 MG CAPS Take 50 mg by mouth every evening. CO ENZYME Q-10, 50MG (Oral Capsule)  1 po qd for 0 days  Quantity: 30.00;  Refills: 0   Ordered :31-Aug-2010  Margarita Rana MD;  Started 31-Mar-2009 Active Comments: DX: 272.0   lisinopril-hydrochlorothiazide (ZESTORETIC) 20-12.5 MG tablet Take 1 tablet by mouth 2 (two) times daily.   metoprolol tartrate (LOPRESSOR) 50 MG tablet Take 1 tablet (50 mg total) by mouth 2 (two) times daily.   montelukast (SINGULAIR) 10 MG tablet Take 10 mg by mouth as needed.   pantoprazole (PROTONIX) 40 MG tablet Take 1 tablet (40 mg total) by mouth daily.   Polyethyl Glycol-Propyl Glycol (LUBRICANT EYE DROPS) 0.4-0.3 % SOLN Place 1-2 drops into both eyes 3 (three) times daily as needed (dry/irritated eyes).   sildenafil (VIAGRA) 50 MG tablet TAKE 1 TABLET BY MOUTH AS NEEDED FOR ERECTILE DYSFUNCTION GENERIC EQUIVALENT FOR VIAGRA   simvastatin (ZOCOR) 20 MG tablet Take 1 tablet (20 mg total) by mouth at bedtime.   tamsulosin (FLOMAX) 0.4 MG CAPS capsule TAKE 2 CAPSULES BY MOUTH DAILY GENERIC EQUIVALENT FOR FLOMAX  VENTOLIN HFA 108 (90 Base) MCG/ACT inhaler Inhale 2 puffs into the lungs every 4 (four) hours as needed for wheezing.     Allergies:   Penicillins   Social History   Socioeconomic History   Marital status: Married    Spouse name: Not on file   Number of children: 2   Years of education: Not on file   Highest education level: Not on file  Occupational History   Occupation: supervisor  Tobacco Use   Smoking status: Former    Packs/day: 1.50    Years: 20.00    Total pack years: 30.00    Types: Cigarettes    Quit date: 11/29/2015    Years  since quitting: 7.0   Smokeless tobacco: Never   Tobacco comments:    quit June of 2008 after Chantix. he was exposed to secondary smoke  Vaping Use   Vaping Use: Never used  Substance and Sexual Activity   Alcohol use: Yes    Alcohol/week: 0.0 standard drinks of alcohol    Comment: occasional   Drug use: No   Sexual activity: Not on file  Other Topics Concern   Not on file  Social History Narrative   Not on file   Social Determinants of Health   Financial Resource Strain: Not on file  Food Insecurity: Not on file  Transportation Needs: Not on file  Physical Activity: Not on file  Stress: Not on file  Social Connections: Not on file     Family History: The patient's family history includes Congestive Heart Failure in his mother; Diabetes in his mother; Healthy in his brother, brother, brother, and sister; Pancreatic cancer in his father.  ROS:   Please see the history of present illness.    All other systems reviewed and are negative.  EKGs/Labs/Other Studies Reviewed:    The following studies were reviewed today:     Recent Labs: 02/16/2022: ALT 19 04/30/2022: Magnesium 1.9 09/29/2022: BUN 23; Creatinine, Ser 1.22; Hemoglobin 15.0; Platelets 162; Potassium 5.0; Sodium 138  Recent Lipid Panel    Component Value Date/Time   CHOL 122 01/14/2021 0913   TRIG 73 01/14/2021 0913   HDL 35 (L) 01/14/2021 0913   CHOLHDL 3.5 01/14/2021 0913   LDLCALC 72 01/14/2021 0913    Physical Exam:    VS:  BP 120/76   Pulse 72   Ht 6' 1"$  (1.854 m)   Wt 299 lb (135.6 kg)   SpO2 96%   BMI 39.45 kg/m     Wt Readings from Last 3 Encounters:  12/22/22 299 lb (135.6 kg)  10/04/22 295 lb (133.8 kg)  09/29/22 297 lb (134.7 kg)     GEN:  Well nourished, well developed in no acute distress CARDIAC: irregularly irregular, no murmurs, rubs, gallops       ASSESSMENT:    1. Persistent atrial fibrillation (HCC)   2. HFrEF (heart failure with reduced ejection fraction) ([place])    3. Primary hypertension    PLAN:    In order of problems listed above:   #Persistent atrial fibrillation Symptomatic.  Has had recurrence after his initial ablation. He is scheduled for redo catheter ablation January 14, 2023. Continue Eliquis for stroke prophylaxis  Discussed treatment options today for their AF including antiarrhythmic drug therapy and ablation. Discussed risks, recovery and likelihood of success. Discussed potential need for repeat ablation procedures and antiarrhythmic drugs after an initial ablation. They wish to proceed with scheduling.  Risk, benefits, and alternatives to EP study and  radiofrequency ablation for afib were also discussed in detail today. These risks include but are not limited to stroke, bleeding, vascular damage, tamponade, perforation, damage to the esophagus, lungs, and other structures, pulmonary vein stenosis, worsening renal function, and death. The patient understands these risk and wishes to proceed.  We will therefore proceed with catheter ablation at the next available time.  Carto, ICE, anesthesia are requested for the procedure.  Will also obtain CT PV protocol prior to the procedure to exclude LAA thrombus and further evaluate atrial anatomy.   #Chronic systolic heart failure NYHA class II-III.  Warm dry on exam.  Rhythm control important.  Continue GDMT.  #Hypertension At goal today.  Recommend checking blood pressures 1-2 times per week at home and recording the values.  Recommend bringing these recordings to the primary care physician.  #Obesity Primary care planning to start a weight loss drug which I think is a good idea.  We discussed the link between obesity and atrial fibrillation during today's clinic appointment.   Medication Adjustments/Labs and Tests Ordered: Current medicines are reviewed at length with the patient today.  Concerns regarding medicines are outlined above.  No orders of the defined types were placed in  this encounter.  No orders of the defined types were placed in this encounter.    Signed, Lars Mage, MD, [place], [facility] 12/22/2022 10:45 AM    Electrophysiology [facility]

## 2022-12-22 ENCOUNTER — Ambulatory Visit: Payer: BC Managed Care – PPO | Attending: Cardiology | Admitting: Cardiology

## 2022-12-22 ENCOUNTER — Other Ambulatory Visit
Admission: RE | Admit: 2022-12-22 | Discharge: 2022-12-22 | Disposition: A | Discharge status: Home / Self Care | Payer: BC Managed Care – PPO | Source: Ambulatory Visit | Attending: Cardiology | Admitting: Cardiology

## 2022-12-22 ENCOUNTER — Encounter: Payer: Self-pay | Admitting: Cardiology

## 2022-12-22 VITALS — BP 120/76 | HR 72 | Ht 73.0 in | Wt 299.0 lb

## 2022-12-22 DIAGNOSIS — I10 Essential (primary) hypertension: Secondary | ICD-10-CM

## 2022-12-22 DIAGNOSIS — Z01818 Encounter for other preprocedural examination: Secondary | ICD-10-CM | POA: Diagnosis not present

## 2022-12-22 DIAGNOSIS — I4819 Other persistent atrial fibrillation: Secondary | ICD-10-CM

## 2022-12-22 DIAGNOSIS — I5020 Unspecified systolic (congestive) heart failure: Secondary | ICD-10-CM | POA: Diagnosis not present

## 2022-12-22 LAB — CBC WITH DIFFERENTIAL/PLATELET
Abs Immature Granulocytes: 0.09 10*3/uL — ABNORMAL HIGH (ref 0.00–0.07)
Basophils Absolute: 0.1 10*3/uL (ref 0.0–0.1)
Basophils Relative: 1 %
Eosinophils Absolute: 0.2 10*3/uL (ref 0.0–0.5)
Eosinophils Relative: 2 %
HCT: 54.2 % — ABNORMAL HIGH (ref 39.0–52.0)
Hemoglobin: 15.6 g/dL (ref 13.0–17.0)
Immature Granulocytes: 1 %
Lymphocytes Relative: 18 %
Lymphs Abs: 1.8 10*3/uL (ref 0.7–4.0)
MCH: 23.6 pg — ABNORMAL LOW (ref 26.0–34.0)
MCHC: 28.8 g/dL — ABNORMAL LOW (ref 30.0–36.0)
MCV: 82.1 fL (ref 80.0–100.0)
Monocytes Absolute: 0.8 10*3/uL (ref 0.1–1.0)
Monocytes Relative: 8 %
Neutro Abs: 7.1 10*3/uL (ref 1.7–7.7)
Neutrophils Relative %: 70 %
Platelets: 135 10*3/uL — ABNORMAL LOW (ref 150–400)
RBC: 6.6 MIL/uL — ABNORMAL HIGH (ref 4.22–5.81)
RDW: 17.8 % — ABNORMAL HIGH (ref 11.5–15.5)
WBC: 10 10*3/uL (ref 4.0–10.5)
nRBC: 0 % (ref 0.0–0.2)

## 2022-12-22 LAB — BASIC METABOLIC PANEL
Anion gap: 9 (ref 5–15)
BUN: 22 mg/dL (ref 8–23)
CO2: 31 mmol/L (ref 22–32)
Calcium: 9.2 mg/dL (ref 8.9–10.3)
Chloride: 97 mmol/L — ABNORMAL LOW (ref 98–111)
Creatinine, Ser: 1.14 mg/dL (ref 0.61–1.24)
GFR, Estimated: >60 mL/min (ref >60)
Glucose, Bld: 114 mg/dL — ABNORMAL HIGH (ref 70–99)
Potassium: 4.5 mmol/L (ref 3.5–5.1)
Sodium: 137 mmol/L (ref 135–145)

## 2022-12-22 NOTE — Patient Instructions (Signed)
Medication Instructions:  Your physician recommends that you continue on your current medications as directed. Please refer to the Current Medication list given to you today.  *If you need a refill on your cardiac medications before your next appointment, please call your pharmacy*  Lab Work: TODAY: [facility] and [place] will get your lab work at [facility] hospital.  Your lab work will be done at the [facility] next to [employer].  These are walk in labs- you will not need an appointment and you do not need to be fasting.    Testing/Procedures: CT scan on 01/07/23 at 10:30am (see instruction letter) Ablation on 01/14/23 at 8:30am (see instruction letter)  Follow-Up: At [facility], you and your health needs are our priority.  As part of our continuing mission to provide you with exceptional heart care, we have created designated Provider Care Teams.  These Care Teams include your primary Cardiologist (physician) and Advanced Practice Providers (APPs -  Physician Assistants and Nurse Practitioners) who all work together to provide you with the care you need, when you need it.  Your next appointment:   As scheduled

## 2022-12-31 DIAGNOSIS — D751 Secondary polycythemia: Secondary | ICD-10-CM | POA: Diagnosis not present

## 2022-12-31 DIAGNOSIS — Z0000 Encounter for general adult medical examination without abnormal findings: Secondary | ICD-10-CM | POA: Diagnosis not present

## 2023-01-04 IMAGING — CT CT HEART MORPH/PULM VEIN W/ CM & W/O CA SCORE
1 of 8 series · 11 of 20 positions shown, 14 images · non-contrast
Comparison: None
COMPARISON: None

Addendum:
EXAM:
OVER-READ INTERPRETATION  CT CHEST

The following report is a limited chest CT over-read performed by
radiologist Dr. Sayem Ahmed Kopal [REDACTED] on 04/22/2022.
The coronary calcium score and coronary CTA interpretation by the
cardiologist is attached.
CLINICAL DATA: Atrial fibrillation scheduled for an ablation.
Cardiac CT/CTA
TECHNIQUE: The patient was scanned on a Siemens Somatom scanner.

[Series 25: multiphase % pulmonary vein 0.60 · axial · 0.42mm/px · z∈[-1209,-1095]mm · 11 of 2401 slices shown, 14 images]
[im 201/2401  vessel]
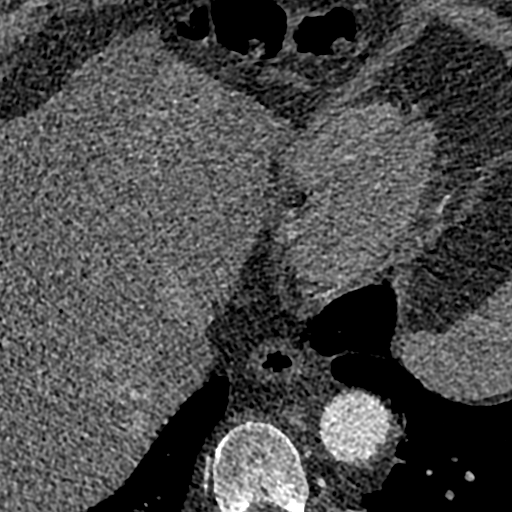
[im 201/2401  lung]
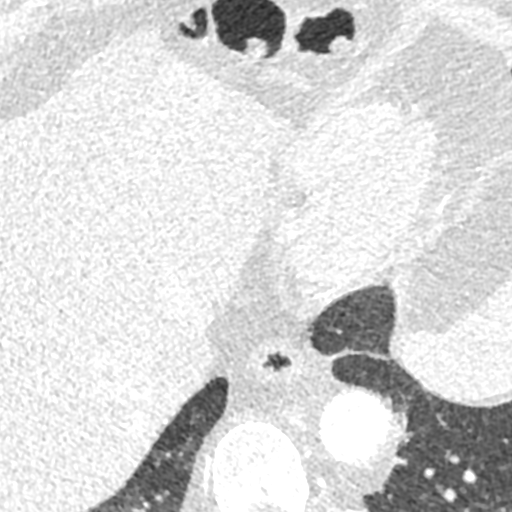
[im 401/2401  vessel]
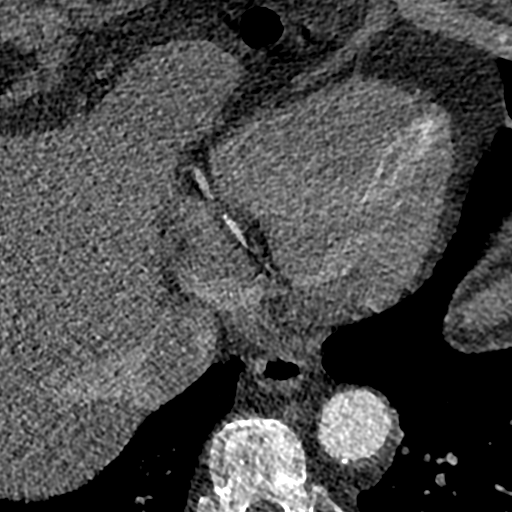
[im 601/2401  vessel]
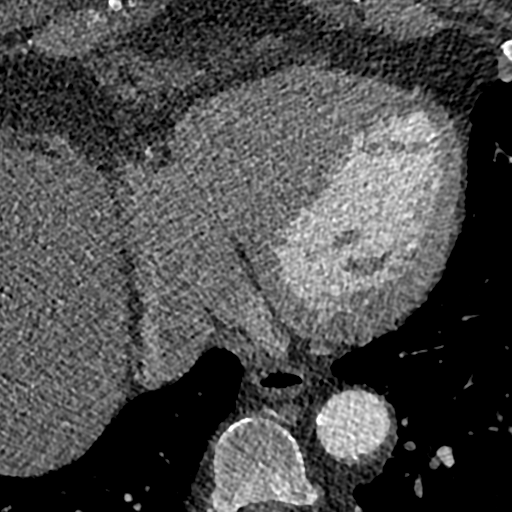
[im 801/2401  vessel]
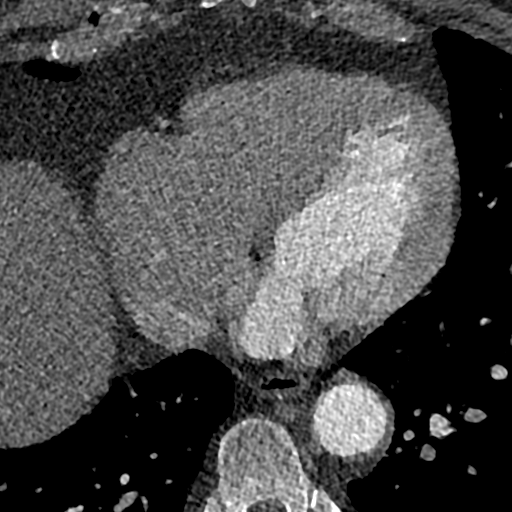
[im 1001/2401  vessel]
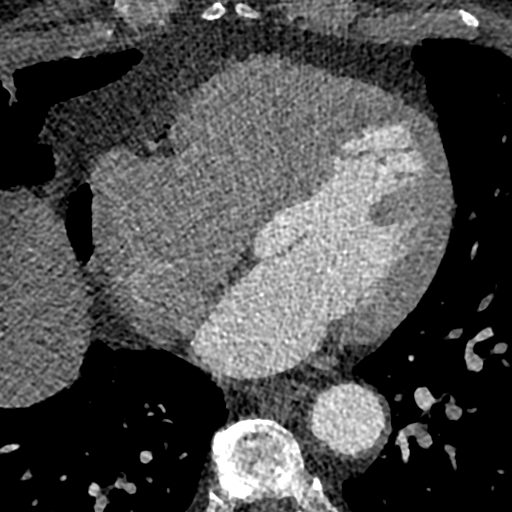
[im 1001/2401  lung]
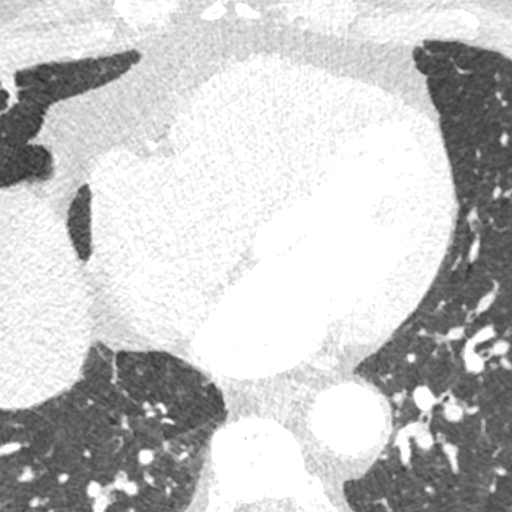
[im 1201/2401  vessel]
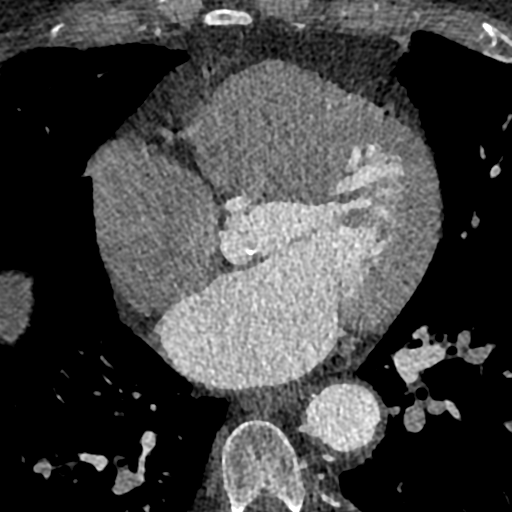
[im 1401/2401  vessel]
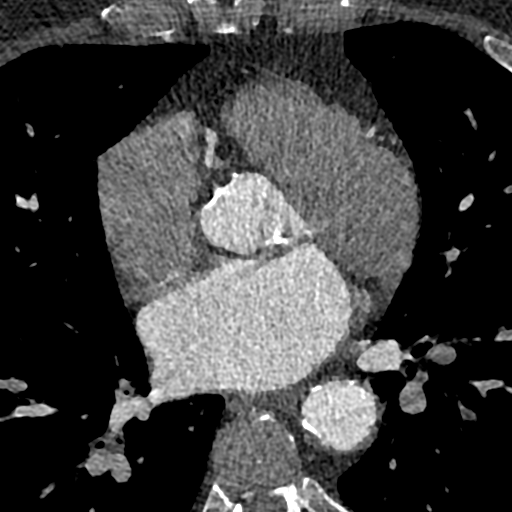
[im 1601/2401  vessel]
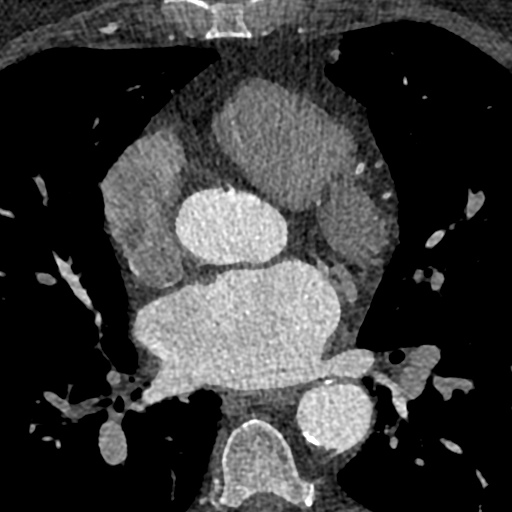
[im 1801/2401  vessel]
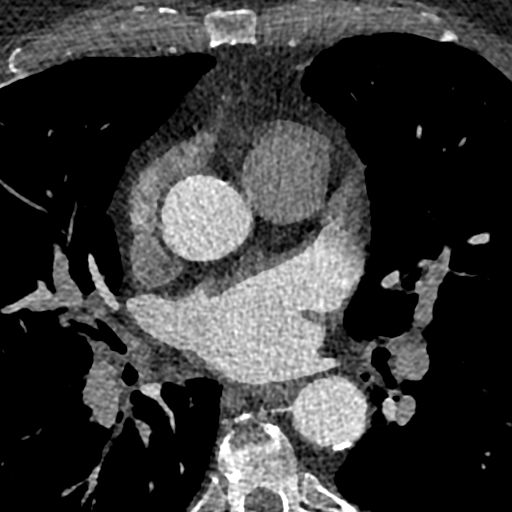
[im 1801/2401  lung]
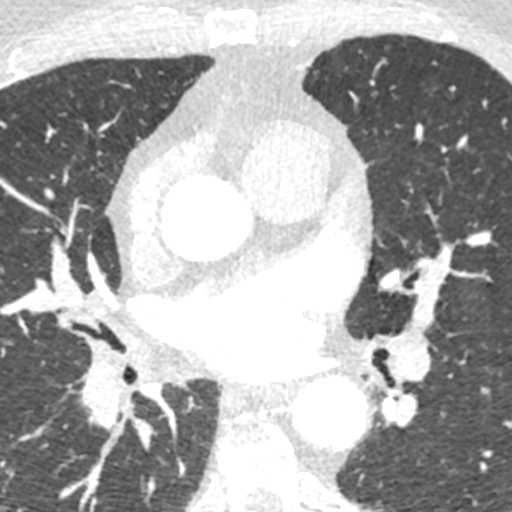
[im 2001/2401  vessel]
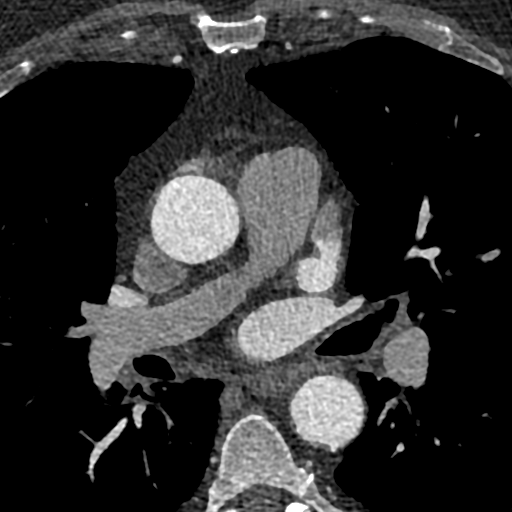
[im 2201/2401  vessel]
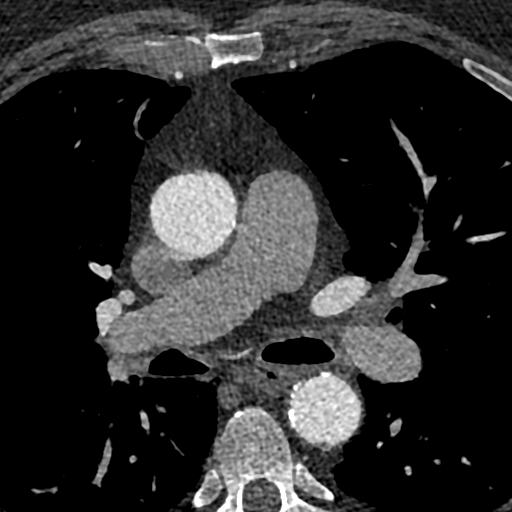

[11 of 20 positions shown; findings below may reference images not displayed]

FINDINGS: Vascular: Ascending thoracic aorta upper normal caliber.
Atherosclerotic calcifications thoracic aorta. No pericardial
effusion. Visualized pulmonary arteries grossly patent.

Mediastinum/Nodes: Esophagus unremarkable.  No adenopathy.

Lungs/Pleura: Subsegmental atelectasis in anterior RIGHT upper lobe.
Minimal linear subsegmental atelectasis RIGHT lower lobe. Diffuse
peribronchial thickening. No pulmonary infiltrate, pleural effusion,
or pneumothorax. No pulmonary mass.

Upper Abdomen: Unremarkable

Musculoskeletal: Unremarkable
IMPRESSION: Diffuse peribronchial thickening with subsegmental atelectasis in
the RIGHT lung.

Aortic Atherosclerosis (3U20A-KCD.D).
FINDINGS: A 120 kV prospective scan was triggered in the descending thoracic
aorta at 111 HU's. Gantry rotation speed was 280 msecs and
collimation was .9 mm. No beta blockade and no NTG was given. The 3D
data set was reconstructed in 5% intervals of the 60-80 % of the R-R
cycle. Diastolic phases were analyzed on a dedicated work station
using MPR, MIP and VRT modes. The patient received 80 cc of
contrast.

There is normal pulmonary vein drainage into the left atrium (2 on
the right and 2 on the left) with ostial measurements as follows:

RUPV: 26 x 23 mm, Area 45 mm2

RLPV: 23 x 19 mm, Area 34 mm2

LUPV: 19 x 17 mm, Area 24 mm2

LLPV: 20 x 12 mm, Area 16 mm2

The left atrial appendage is a Windsock type with ostial size 36 x
23 mm and length 34 mm, Area 61 mm2. There is no thrombus in the
left atrial appendage.

The esophagus runs in the left atrial midline and is not in the
proximity to any of the pulmonary veins.

Aorta: Normal caliber. No dissection. Mild aortic root and
descending aorta calcifications.

Aortic Valve:  Trileaflet.  No calcifications.

Coronary Arteries: Normal coronary origin. Right dominance. The
study was performed without use of NTG and insufficient for plaque
evaluation.
IMPRESSION: 1. There is normal pulmonary vein drainage into the left atrium. (2
on the right and 2 on the left).

2. The left atrial appendage is a Windsock type with ostial size 36
x 23 mm and length 34 mm, Area 61 mm2. There is no thrombus in the
left atrial appendage.

3. The esophagus runs in the left atrial midline and is not in the
proximity to any of the pulmonary veins.

*** End of Addendum ***
EXAM:
OVER-READ INTERPRETATION  CT CHEST

The following report is a limited chest CT over-read performed by
radiologist Dr. Sayem Ahmed Kopal [REDACTED] on 04/22/2022.
The coronary calcium score and coronary CTA interpretation by the
cardiologist is attached.
FINDINGS: Vascular: Ascending thoracic aorta upper normal caliber.
Atherosclerotic calcifications thoracic aorta. No pericardial
effusion. Visualized pulmonary arteries grossly patent.

Mediastinum/Nodes: Esophagus unremarkable.  No adenopathy.

Lungs/Pleura: Subsegmental atelectasis in anterior RIGHT upper lobe.
Minimal linear subsegmental atelectasis RIGHT lower lobe. Diffuse
peribronchial thickening. No pulmonary infiltrate, pleural effusion,
or pneumothorax. No pulmonary mass.

Upper Abdomen: Unremarkable

Musculoskeletal: Unremarkable
IMPRESSION: Diffuse peribronchial thickening with subsegmental atelectasis in
the RIGHT lung.

Aortic Atherosclerosis (3U20A-KCD.D).

## 2023-01-06 ENCOUNTER — Encounter (HOSPITAL_COMMUNITY): Payer: Self-pay | Admitting: *Deleted

## 2023-01-06 ENCOUNTER — Encounter: Payer: Self-pay | Admitting: Oncology

## 2023-01-06 ENCOUNTER — Telehealth (HOSPITAL_COMMUNITY): Payer: Self-pay | Admitting: *Deleted

## 2023-01-06 NOTE — Telephone Encounter (Signed)
Patient returning call regarding upcoming cardiac imaging study; pt verbalizes understanding of appt date/time, parking situation and where to check in, pre-test NPO status, and verified current allergies; name and call back number provided for further questions should they arise  Gordy Clement RN Navigator Cardiac Imaging [place] [facility] [phone] office [phone] cell

## 2023-01-06 NOTE — Telephone Encounter (Signed)
Attempted to call patient regarding upcoming cardiac CT appointment. °Left message on voicemail with name and callback number ° °Maricsa Sammons RN Navigator Cardiac Imaging °[facility] °[phone] Office °[phone] Cell ° °

## 2023-01-07 ENCOUNTER — Ambulatory Visit
Admission: RE | Admit: 2023-01-07 | Discharge: 2023-01-07 | Disposition: A | Discharge status: Home / Self Care | Payer: BC Managed Care – PPO | Source: Ambulatory Visit | Attending: Cardiology | Admitting: Cardiology

## 2023-01-07 DIAGNOSIS — I4819 Other persistent atrial fibrillation: Secondary | ICD-10-CM

## 2023-01-07 DIAGNOSIS — Z01818 Encounter for other preprocedural examination: Secondary | ICD-10-CM | POA: Diagnosis not present

## 2023-01-07 MED ORDER — IOHEXOL 350 MG/ML SOLN
100.0000 mL | Freq: Once | INTRAVENOUS | Status: AC | PRN
  Administered 2023-01-07: 100 mL via INTRAVENOUS

## 2023-01-13 NOTE — Pre-Procedure Instructions (Signed)
Attempted to call patient regarding procedure instructions for tomorrow.  Left voicemail on the following items: Arrival time 0830 Nothing to eat or drink after midnight No meds AM of procedure Responsible person to drive you home and stay with you for 24 hrs  Have you missed any doses of anti-coagulant Eliquis- if you have missed any doses please let the office know right away.  Don't take dose in the morning.

## 2023-01-14 ENCOUNTER — Other Ambulatory Visit (HOSPITAL_COMMUNITY): Payer: Self-pay

## 2023-01-14 ENCOUNTER — Encounter (HOSPITAL_COMMUNITY)
Admission: RE | Disposition: A | Discharge status: Home / Self Care | Payer: Self-pay | Source: Home / Self Care | Attending: Cardiology

## 2023-01-14 ENCOUNTER — Ambulatory Visit (HOSPITAL_COMMUNITY): Payer: BC Managed Care – PPO | Admitting: Anesthesiology

## 2023-01-14 ENCOUNTER — Encounter (HOSPITAL_COMMUNITY): Payer: Self-pay | Admitting: Cardiology

## 2023-01-14 ENCOUNTER — Other Ambulatory Visit: Payer: Self-pay

## 2023-01-14 ENCOUNTER — Ambulatory Visit (HOSPITAL_COMMUNITY)
Admission: RE | Admit: 2023-01-14 | Discharge: 2023-01-14 | Disposition: A | Discharge status: Home / Self Care | Payer: BC Managed Care – PPO | Attending: Cardiology | Admitting: Cardiology

## 2023-01-14 ENCOUNTER — Encounter: Payer: Self-pay | Admitting: Oncology

## 2023-01-14 DIAGNOSIS — Z6839 Body mass index (BMI) 39.0-39.9, adult: Secondary | ICD-10-CM | POA: Insufficient documentation

## 2023-01-14 DIAGNOSIS — E669 Obesity, unspecified: Secondary | ICD-10-CM | POA: Insufficient documentation

## 2023-01-14 DIAGNOSIS — I110 Hypertensive heart disease with heart failure: Secondary | ICD-10-CM | POA: Insufficient documentation

## 2023-01-14 DIAGNOSIS — G4730 Sleep apnea, unspecified: Secondary | ICD-10-CM | POA: Diagnosis not present

## 2023-01-14 DIAGNOSIS — I5022 Chronic systolic (congestive) heart failure: Secondary | ICD-10-CM | POA: Insufficient documentation

## 2023-01-14 DIAGNOSIS — Z7901 Long term (current) use of anticoagulants: Secondary | ICD-10-CM | POA: Insufficient documentation

## 2023-01-14 DIAGNOSIS — I4819 Other persistent atrial fibrillation: Secondary | ICD-10-CM | POA: Diagnosis not present

## 2023-01-14 DIAGNOSIS — I4891 Unspecified atrial fibrillation: Secondary | ICD-10-CM | POA: Diagnosis not present

## 2023-01-14 DIAGNOSIS — Z87891 Personal history of nicotine dependence: Secondary | ICD-10-CM | POA: Diagnosis not present

## 2023-01-14 DIAGNOSIS — I10 Essential (primary) hypertension: Secondary | ICD-10-CM | POA: Diagnosis not present

## 2023-01-14 DIAGNOSIS — G4733 Obstructive sleep apnea (adult) (pediatric): Secondary | ICD-10-CM | POA: Diagnosis not present

## 2023-01-14 HISTORY — PX: ATRIAL FIBRILLATION ABLATION: EP1191

## 2023-01-14 LAB — POCT ACTIVATED CLOTTING TIME: Activated Clotting Time: 293 seconds

## 2023-01-14 SURGERY — ATRIAL FIBRILLATION ABLATION
Anesthesia: General

## 2023-01-14 MED ORDER — ONDANSETRON HCL 4 MG/2ML IJ SOLN: 4.0000 mg | Freq: Four times a day (QID) | INTRAMUSCULAR | Status: DC | PRN

## 2023-01-14 MED ORDER — ACETAMINOPHEN 500 MG PO TABS: 1000.0000 mg | ORAL_TABLET | Freq: Once | ORAL | Status: AC

## 2023-01-14 MED ORDER — ONDANSETRON HCL 4 MG/2ML IJ SOLN
INTRAMUSCULAR | Status: DC | PRN
  Administered 2023-01-14: 4 mg via INTRAVENOUS

## 2023-01-14 MED ORDER — ROCURONIUM BROMIDE 10 MG/ML (PF) SYRINGE
PREFILLED_SYRINGE | INTRAVENOUS | Status: DC | PRN
  Administered 2023-01-14: 60 mg via INTRAVENOUS

## 2023-01-14 MED ORDER — PHENYLEPHRINE 80 MCG/ML (10ML) SYRINGE FOR IV PUSH (FOR BLOOD PRESSURE SUPPORT)
PREFILLED_SYRINGE | INTRAVENOUS | Status: DC | PRN
  Administered 2023-01-14 (×3): 160 ug via INTRAVENOUS
  Administered 2023-01-14: 240 ug via INTRAVENOUS
  Administered 2023-01-14: 160 ug via INTRAVENOUS
  Administered 2023-01-14: 240 ug via INTRAVENOUS
  Administered 2023-01-14 (×2): 160 ug via INTRAVENOUS

## 2023-01-14 MED ORDER — COLCHICINE 0.6 MG PO TABS
0.6000 mg | ORAL_TABLET | Freq: Two times a day (BID) | ORAL | Status: DC
  Administered 2023-01-14: 0.6 mg via ORAL
  Filled 2023-01-14: qty 1

## 2023-01-14 MED ORDER — DEXAMETHASONE SODIUM PHOSPHATE 10 MG/ML IJ SOLN
INTRAMUSCULAR | Status: DC | PRN
  Administered 2023-01-14: 10 mg via INTRAVENOUS

## 2023-01-14 MED ORDER — PANTOPRAZOLE SODIUM 40 MG PO TBEC
40.0000 mg | DELAYED_RELEASE_TABLET | Freq: Every day | ORAL | 0 refills | Status: DC
  Filled 2023-01-14: qty 45, 45d supply, fill #0

## 2023-01-14 MED ORDER — EPHEDRINE SULFATE-NACL 50-0.9 MG/10ML-% IV SOSY
PREFILLED_SYRINGE | INTRAVENOUS | Status: DC | PRN
  Administered 2023-01-14 (×2): 5 mg via INTRAVENOUS

## 2023-01-14 MED ORDER — HEPARIN SODIUM (PORCINE) 1000 UNIT/ML IJ SOLN
INTRAMUSCULAR | Status: DC | PRN
  Administered 2023-01-14: 1000 [IU] via INTRAVENOUS

## 2023-01-14 MED ORDER — APIXABAN 5 MG PO TABS
5.0000 mg | ORAL_TABLET | Freq: Two times a day (BID) | ORAL | Status: DC
  Administered 2023-01-14: 5 mg via ORAL
  Filled 2023-01-14: qty 1

## 2023-01-14 MED ORDER — SODIUM CHLORIDE 0.9 % IV SOLN: 250.0000 mL | INTRAVENOUS | Status: DC | PRN

## 2023-01-14 MED ORDER — FENTANYL CITRATE (PF) 250 MCG/5ML IJ SOLN
INTRAMUSCULAR | Status: DC | PRN
  Administered 2023-01-14: 75 ug via INTRAVENOUS

## 2023-01-14 MED ORDER — PHENYLEPHRINE HCL-NACL 20-0.9 MG/250ML-% IV SOLN
INTRAVENOUS | Status: DC | PRN
  Administered 2023-01-14: 25 ug/min via INTRAVENOUS

## 2023-01-14 MED ORDER — SODIUM CHLORIDE 0.9% FLUSH: 3.0000 mL | INTRAVENOUS | Status: DC | PRN

## 2023-01-14 MED ORDER — HEPARIN (PORCINE) IN NACL 1000-0.9 UT/500ML-% IV SOLN
INTRAVENOUS | Status: DC | PRN
  Administered 2023-01-14 (×3): 500 mL

## 2023-01-14 MED ORDER — COLCHICINE 0.6 MG PO TABS
0.6000 mg | ORAL_TABLET | Freq: Two times a day (BID) | ORAL | 0 refills | Status: DC
  Filled 2023-01-14: qty 10, 5d supply, fill #0

## 2023-01-14 MED ORDER — LIDOCAINE 2% (20 MG/ML) 5 ML SYRINGE
INTRAMUSCULAR | Status: DC | PRN
  Administered 2023-01-14: 80 mg via INTRAVENOUS

## 2023-01-14 MED ORDER — MIDAZOLAM HCL 2 MG/2ML IJ SOLN
INTRAMUSCULAR | Status: DC | PRN
  Administered 2023-01-14: 2 mg via INTRAVENOUS

## 2023-01-14 MED ORDER — ACETAMINOPHEN 500 MG PO TABS
ORAL_TABLET | ORAL | Status: AC
  Administered 2023-01-14: 1000 mg via ORAL
  Filled 2023-01-14: qty 2

## 2023-01-14 MED ORDER — SUGAMMADEX SODIUM 200 MG/2ML IV SOLN
INTRAVENOUS | Status: DC | PRN
  Administered 2023-01-14: 300 mg via INTRAVENOUS

## 2023-01-14 MED ORDER — HEPARIN SODIUM (PORCINE) 1000 UNIT/ML IJ SOLN
INTRAMUSCULAR | Status: AC
  Filled 2023-01-14: qty 10

## 2023-01-14 MED ORDER — SODIUM CHLORIDE 0.9 % IV SOLN: INTRAVENOUS | Status: DC

## 2023-01-14 MED ORDER — PROTAMINE SULFATE 10 MG/ML IV SOLN
INTRAVENOUS | Status: DC | PRN
  Administered 2023-01-14: 35 mg via INTRAVENOUS

## 2023-01-14 MED ORDER — PANTOPRAZOLE SODIUM 40 MG PO TBEC
40.0000 mg | DELAYED_RELEASE_TABLET | Freq: Every day | ORAL | Status: DC
  Administered 2023-01-14: 40 mg via ORAL
  Filled 2023-01-14: qty 1

## 2023-01-14 MED ORDER — ACETAMINOPHEN 325 MG PO TABS: 650.0000 mg | ORAL_TABLET | ORAL | Status: DC | PRN

## 2023-01-14 MED ORDER — HEPARIN SODIUM (PORCINE) 1000 UNIT/ML IJ SOLN
INTRAMUSCULAR | Status: DC | PRN
  Administered 2023-01-14: 19000 [IU] via INTRAVENOUS
  Administered 2023-01-14: 4000 [IU] via INTRAVENOUS

## 2023-01-14 MED ORDER — PROPOFOL 10 MG/ML IV BOLUS
INTRAVENOUS | Status: DC | PRN
  Administered 2023-01-14: 170 mg via INTRAVENOUS

## 2023-01-14 MED ORDER — SODIUM CHLORIDE 0.9% FLUSH: 3.0000 mL | Freq: Two times a day (BID) | INTRAVENOUS | Status: DC

## 2023-01-14 SURGICAL SUPPLY — 19 items
BAG SNAP BAND KOVER 36X36 (MISCELLANEOUS) IMPLANT
CATH ABLAT QDOT MICRO BI TC DF (CATHETERS) IMPLANT
CATH OCTARAY 2.0 F 3-3-3-3-3 (CATHETERS) IMPLANT
CATH S CIRCA THERM PROBE 10F (CATHETERS) IMPLANT
CATH SOUNDSTAR ECO 8FR (CATHETERS) IMPLANT
CATH WEBSTER BI DIR CS D-F CRV (CATHETERS) IMPLANT
CLOSURE PERCLOSE PROSTYLE (VASCULAR PRODUCTS) IMPLANT
COVER SWIFTLINK CONNECTOR (BAG) ×1 IMPLANT
MAT PREVALON FULL STRYKER (MISCELLANEOUS) IMPLANT
PACK EP LATEX FREE (CUSTOM PROCEDURE TRAY) ×1
PACK EP LF (CUSTOM PROCEDURE TRAY) ×1 IMPLANT
PAD DEFIB RADIO PHYSIO CONN (PAD) ×1 IMPLANT
PATCH CARTO3 (PAD) IMPLANT
SHEATH BAYLIS TRANSSEPTAL 98CM (NEEDLE) IMPLANT
SHEATH CARTO VIZIGO SM CVD (SHEATH) IMPLANT
SHEATH PINNACLE 8F 10CM (SHEATH) IMPLANT
SHEATH PINNACLE 9F 10CM (SHEATH) IMPLANT
SHEATH PROBE COVER 6X72 (BAG) IMPLANT
TUBING SMART ABLATE COOLFLOW (TUBING) IMPLANT

## 2023-01-14 NOTE — Discharge Instructions (Signed)

## 2023-01-14 NOTE — Progress Notes (Signed)
Pt ambulated to and from bathroom with slight oozing noted at Left groin site. Dressing removed showing no active bleeding then covered with new dressing

## 2023-01-14 NOTE — Anesthesia Procedure Notes (Signed)
Procedure Name: Intubation Date/Time: 01/14/2023 10:20 AM  Performed by: Carolan Clines, CRNAPre-anesthesia Checklist: Patient identified, Emergency Drugs available, Suction available and Patient being monitored Patient Re-evaluated:Patient Re-evaluated prior to induction Oxygen Delivery Method: Circle System Utilized Preoxygenation: Pre-oxygenation with 100% oxygen Induction Type: IV induction Ventilation: Mask ventilation with difficulty, Two handed mask ventilation required and Oral airway inserted - appropriate to patient size Laryngoscope Size: Mac and 4 Grade View: Grade I Tube type: Oral Tube size: 7.5 mm Number of attempts: 1 Airway Equipment and Method: Stylet and Oral airway Placement Confirmation: ETT inserted through vocal cords under direct vision, positive ETCO2 and breath sounds checked- equal and bilateral Secured at: 24 cm Tube secured with: Tape Dental Injury: Teeth and Oropharynx as per pre-operative assessment

## 2023-01-14 NOTE — Transfer of Care (Signed)
Immediate Anesthesia Transfer of Care Note  Patient: Jason Bolton  Procedure(s) Performed: ATRIAL FIBRILLATION ABLATION  Patient Location: Cath Lab  Anesthesia Type:General  Level of Consciousness: drowsy  Airway & Oxygen Therapy: Patient Spontanous Breathing and Patient connected to face mask oxygen  Post-op Assessment: Report given to RN and Post -op Vital signs reviewed and stable  Post vital signs: Reviewed and stable  Last Vitals:  Vitals Value Taken Time  BP 100/44 01/14/23 1218  Temp    Pulse 86 01/14/23 1220  Resp 26 01/14/23 1220  SpO2 92 % 01/14/23 1220  Vitals shown include unvalidated device data.  Last Pain:  Vitals:   01/14/23 0814  TempSrc: Oral         Complications: There were no known notable events for this encounter.

## 2023-01-14 NOTE — Interval H&P Note (Signed)
History and Physical Interval Note:  01/14/2023 9:49 AM  Jason Bolton  has presented today for surgery, with the diagnosis of afib.  The various methods of treatment have been discussed with the patient and family. After consideration of risks, benefits and other options for treatment, the patient has consented to  Procedure(s): ATRIAL FIBRILLATION ABLATION (N/A) as a surgical intervention.  The patient's history has been reviewed, patient examined, no change in status, stable for surgery.  I have reviewed the patient's chart and labs.  Questions were answered to the patient's satisfaction.     Camesha Farooq T Zari Cly

## 2023-01-14 NOTE — Anesthesia Postprocedure Evaluation (Signed)
Anesthesia Post Note  Patient: Jason Bolton  Procedure(s) Performed: ATRIAL FIBRILLATION ABLATION     Patient location during evaluation: PACU Anesthesia Type: General Level of consciousness: awake and alert Pain management: pain level controlled Vital Signs Assessment: post-procedure vital signs reviewed and stable Respiratory status: spontaneous breathing, nonlabored ventilation, respiratory function stable and patient connected to nasal cannula oxygen Cardiovascular status: blood pressure returned to baseline and stable Postop Assessment: no apparent nausea or vomiting Anesthetic complications: no   There were no known notable events for this encounter.  Last Vitals:  Vitals:   01/14/23 1346 01/14/23 1400  BP: 98/69 110/64  Pulse: 74 74  Resp: 13 14  Temp:    SpO2: 91% 91%    Last Pain:  Vitals:   01/14/23 1310  TempSrc:   PainSc: 0-No pain                 Santa Lighter

## 2023-01-14 NOTE — Anesthesia Preprocedure Evaluation (Signed)
Anesthesia Evaluation  Patient identified by MRN, date of birth, ID band Patient awake    Reviewed: Allergy & Precautions, NPO status , Patient's Chart, lab work & pertinent test results  Airway Mallampati: III  TM Distance: >3 FB Neck ROM: Full    Dental  (+) Dental Advisory Given, Upper Dentures, Poor Dentition   Pulmonary sleep apnea and Continuous Positive Airway Pressure Ventilation , former smoker   Pulmonary exam normal breath sounds clear to auscultation       Cardiovascular hypertension, Pt. on home beta blockers and Pt. on medications (-) angina (-) Past MI Normal cardiovascular exam+ dysrhythmias Atrial Fibrillation  Rhythm:Regular Rate:Normal     Neuro/Psych negative neurological ROS     GI/Hepatic negative GI ROS, Neg liver ROS,,,  Endo/Other  negative endocrine ROS  Obesity   Renal/GU negative Renal ROS     Musculoskeletal negative musculoskeletal ROS (+)    Abdominal   Peds  Hematology negative hematology ROS (+)   Anesthesia Other Findings Day of surgery medications reviewed with the patient.  Reproductive/Obstetrics                             Anesthesia Physical Anesthesia Plan  ASA: 3  Anesthesia Plan: General   Post-op Pain Management: Tylenol PO (pre-op)*   Induction: Intravenous  PONV Risk Score and Plan: 2 and Dexamethasone and Ondansetron  Airway Management Planned: Oral ETT  Additional Equipment:   Intra-op Plan:   Post-operative Plan: Extubation in OR  Informed Consent: I have reviewed the patients History and Physical, chart, labs and discussed the procedure including the risks, benefits and alternatives for the proposed anesthesia with the patient or authorized representative who has indicated his/her understanding and acceptance.     Dental advisory given  Plan Discussed with: CRNA  Anesthesia Plan Comments:        Anesthesia Quick  Evaluation

## 2023-01-17 ENCOUNTER — Encounter (HOSPITAL_COMMUNITY): Payer: Self-pay | Admitting: Cardiology

## 2023-02-11 ENCOUNTER — Ambulatory Visit (HOSPITAL_COMMUNITY)
Admission: RE | Admit: 2023-02-11 | Discharge: 2023-02-11 | Disposition: A | Discharge status: Home / Self Care | Payer: BC Managed Care – PPO | Source: Ambulatory Visit | Attending: Physician Assistant | Admitting: Physician Assistant

## 2023-02-11 VITALS — BP 152/80 | HR 60 | Ht 73.0 in | Wt 288.4 lb

## 2023-02-11 DIAGNOSIS — Z6838 Body mass index (BMI) 38.0-38.9, adult: Secondary | ICD-10-CM | POA: Insufficient documentation

## 2023-02-11 DIAGNOSIS — I4819 Other persistent atrial fibrillation: Secondary | ICD-10-CM | POA: Insufficient documentation

## 2023-02-11 DIAGNOSIS — D6869 Other thrombophilia: Secondary | ICD-10-CM | POA: Diagnosis not present

## 2023-02-11 DIAGNOSIS — E669 Obesity, unspecified: Secondary | ICD-10-CM | POA: Insufficient documentation

## 2023-02-11 DIAGNOSIS — I714 Abdominal aortic aneurysm, without rupture, unspecified: Secondary | ICD-10-CM | POA: Diagnosis not present

## 2023-02-11 DIAGNOSIS — I10 Essential (primary) hypertension: Secondary | ICD-10-CM | POA: Insufficient documentation

## 2023-02-11 DIAGNOSIS — D751 Secondary polycythemia: Secondary | ICD-10-CM | POA: Diagnosis not present

## 2023-02-11 DIAGNOSIS — E785 Hyperlipidemia, unspecified: Secondary | ICD-10-CM | POA: Diagnosis not present

## 2023-02-11 DIAGNOSIS — Z7901 Long term (current) use of anticoagulants: Secondary | ICD-10-CM | POA: Diagnosis not present

## 2023-02-11 NOTE — Progress Notes (Signed)
Primary Care Physician: Patient, No Pcp Per Referring Physician: Dr. Quentin Ore Primary EP: Dr Quentin Ore Primary Cardiologist: Dr Venia Minks KOL Jason Bolton is a 67 y.o. male with a h/o HTN, HLD, AAA, afib that who presents for follow up in the [facility]. Patient underwent afib ablation on 04/29/22 with Dr Quentin Ore but had recurrence of his afib and had a DCCV on 10/04/22. On follow up, he was back in afib and underwent repeat ablation on 01/14/23.  On follow up today, patient reports that he has done very well since his ablation. He states it is a "night and day difference". He has lost 15 lbs on Weight Watchers. He denies chest pain, swallowing pain, or groin issues.   Today, he denies symptoms of palpitations, chest pain, shortness of breath, orthopnea, PND, lower extremity edema, dizziness, presyncope, syncope, or neurologic sequela. The patient is tolerating medications without difficulties and is otherwise without complaint today.   Past Medical History:  Diagnosis Date   Allergy    Dysrhythmia    Hypercholesterolemia    Hypertension    Personal history of osteomyelitis    of the jaw- 2005-mandibular surgery   Polycythemia    Sleep apnea    Past Surgical History:  Procedure Laterality Date   ATRIAL FIBRILLATION ABLATION N/A 04/29/2022   Procedure: ATRIAL FIBRILLATION ABLATION;  Surgeon: Vickie Epley, MD;  Location: [place] [facility];  Service: Cardiovascular;  Laterality: N/A;   ATRIAL FIBRILLATION ABLATION N/A 01/14/2023   Procedure: ATRIAL FIBRILLATION ABLATION;  Surgeon: Vickie Epley, MD;  Location: [place] [facility];  Service: Cardiovascular;  Laterality: N/A;   CARDIOVERSION N/A 03/04/2021   Procedure: CARDIOVERSION;  Surgeon: Kate Sable, MD;  Location: [facility];  Service: Cardiovascular;  Laterality: N/A;   CARDIOVERSION N/A 10/04/2022   Procedure: CARDIOVERSION;  Surgeon: Wellington Hampshire, MD;  Location: [facility];  Service:  Cardiovascular;  Laterality: N/A;   CATARACT EXTRACTION Right 11/2013   COLONOSCOPY  2006   COLONOSCOPY WITH PROPOFOL N/A 10/19/2017   Procedure: COLONOSCOPY WITH PROPOFOL;  Surgeon: [name], MD;  Location: [facility];  Service: Endoscopy;  Laterality: N/A;   INGUINAL HERNIA REPAIR Right 1972   MANDIBLE SURGERY  2005   [facility]   PELVIC FRACTURE SURGERY  10/2021    Current Outpatient Medications  Medication Sig Dispense Refill   apixaban (ELIQUIS) 5 MG TABS tablet Take 1 tablet (5 mg total) by mouth 2 (two) times daily. 180 tablet 3   Coenzyme Q10 50 MG CAPS Take 50 mg by mouth every evening. CO ENZYME Q-10, 50MG  (Oral Capsule)  1 po qd for 0 days  Quantity: 30.00;  Refills: 0   Ordered :31-Aug-2010  Margarita Rana MD;  Started 31-Mar-2009 Active Comments: DX: 272.0     lisinopril-hydrochlorothiazide (ZESTORETIC) 20-12.5 MG tablet Take 1 tablet by mouth 2 (two) times daily. 60 tablet 6   metoprolol tartrate (LOPRESSOR) 50 MG tablet Take 1 tablet (50 mg total) by mouth 2 (two) times daily. 180 tablet 3   montelukast (SINGULAIR) 10 MG tablet Take 10 mg by mouth daily as needed (allergies).     pantoprazole (PROTONIX) 40 MG tablet Take 1 tablet (40 mg total) by mouth daily. 45 tablet 0   Polyethyl Glycol-Propyl Glycol (LUBRICANT EYE DROPS) 0.4-0.3 % SOLN Place 1-2 drops into both eyes 3 (three) times daily as needed (dry/irritated eyes).     sildenafil (VIAGRA) 50 MG tablet TAKE 1 TABLET BY MOUTH AS NEEDED  FOR ERECTILE DYSFUNCTION GENERIC EQUIVALENT FOR VIAGRA 8 tablet 3   simvastatin (ZOCOR) 20 MG tablet Take 1 tablet (20 mg total) by mouth at bedtime. 90 tablet 3   tamsulosin (FLOMAX) 0.4 MG CAPS capsule TAKE 2 CAPSULES BY MOUTH DAILY GENERIC EQUIVALENT FOR FLOMAX (Patient taking differently: Take 0.8 mg by mouth every evening. TAKE 2 CAPSULES BY MOUTH DAILY GENERIC EQUIVALENT FOR FLOMAX) 180 capsule 3   VENTOLIN HFA 108 (90 Base) MCG/ACT inhaler Inhale 2 puffs into the lungs every 4  (four) hours as needed for wheezing.     aspirin EC 81 MG tablet Take 81 mg by mouth daily. Swallow whole. (Patient not taking: Reported on 02/11/2023)     No current facility-administered medications for this encounter.    Allergies  Allergen Reactions   Penicillins Other (See Comments)    Childhood Allergy    Social History   Socioeconomic History   Marital status: Married    Spouse name: Not on file   Number of children: 2   Years of education: Not on file   Highest education level: Not on file  Occupational History   Occupation: supervisor  Tobacco Use   Smoking status: Former    Packs/day: 1.50    Years: 20.00    Additional pack years: 0.00    Total pack years: 30.00    Types: Cigarettes    Quit date: 11/29/2015    Years since quitting: 7.2   Smokeless tobacco: Never   Tobacco comments:    quit June of 2008 after [employer]. he was exposed to secondary smoke  Vaping Use   Vaping Use: Never used  Substance and Sexual Activity   Alcohol use: Yes    Alcohol/week: 0.0 standard drinks of alcohol    Comment: occasional   Drug use: No   Sexual activity: Not on file  Other Topics Concern   Not on file  Social History Narrative   Not on file   Social Determinants of Health   Financial Resource Strain: Not on file  Food Insecurity: Not on file  Transportation Needs: Not on file  Physical Activity: Not on file  Stress: Not on file  Social Connections: Not on file  Intimate Partner Violence: Not on file    Family History  Problem Relation Age of Onset   Diabetes Mother    Congestive Heart Failure Mother    Pancreatic cancer Father    Healthy Sister    Healthy Brother    Healthy Brother    Healthy Brother     ROS- All systems are reviewed and negative except as per the HPI above  Physical Exam: Vitals:   02/11/23 1028  BP: (!) 152/80  Pulse: 60  Weight: 130.8 kg  Height: 6\' 1"  (1.854 m)   Wt Readings from Last 3 Encounters:  02/11/23 130.8 kg   01/14/23 133.8 kg  12/22/22 135.6 kg    Labs: Lab Results  Component Value Date   NA 137 12/22/2022   K 4.5 12/22/2022   CL 97 (L) 12/22/2022   CO2 31 12/22/2022   GLUCOSE 114 (H) 12/22/2022   BUN 22 12/22/2022   CREATININE 1.14 12/22/2022   CALCIUM 9.2 12/22/2022   MG 1.9 04/30/2022   Lab Results  Component Value Date   INR 1.1 03/04/2021   Lab Results  Component Value Date   CHOL 122 01/14/2021   HDL 35 (L) 01/14/2021   LDLCALC 72 01/14/2021   TRIG 73 01/14/2021     GEN-  The patient is a well appearing male, alert and oriented x 3 today.   HEENT-head normocephalic, atraumatic, sclera clear, conjunctiva pink, hearing intact, trachea midline. Lungs- Clear to ausculation bilaterally, normal work of breathing Heart- Regular rate and rhythm, no murmurs, rubs or gallops  GI- soft, NT, ND, + BS Extremities- no clubbing, cyanosis, or edema MS- no significant deformity or atrophy Skin- no rash or lesion Psych- euthymic mood, full affect Neuro- strength and sensation are intact   EKG today demonstrates  SR, 1st degree AV block Vent. rate 60 BPM PR interval 210 ms QRS duration 86 ms QT/QTcB 386/386 ms   CHA2DS2-VASc Score = 2  The patient's score is based upon: CHF History: 0 HTN History: 1 Diabetes History: 0 Stroke History: 0 Vascular Disease History: 0 Age Score: 1 Gender Score: 0       ASSESSMENT AND PLAN: 1. Persistent Atrial Fibrillation (ICD10:  I48.19) The patient's CHA2DS2-VASc score is 2, indicating a 2.2% annual risk of stroke.   S/p afib ablation 04/29/22 with repeat ablation 01/24/23 Patient appears to be maintaining SR. Continue Eliquis 5 mg BID with no missed doses for 3 months post ablation.  Continue Lopressor 50 mg BID  2. Secondary Hypercoagulable State (ICD10:  D68.69) The patient is at significant risk for stroke/thromboembolism based upon his CHA2DS2-VASc Score of 2.  Continue Apixaban (Eliquis).   3. HTN Mildly elevated today, has  been better controlled at previous visits. No change today, continue to monitor.   4. Obesity Body mass index is 38.05 kg/m. Lifestyle modification was discussed and encouraged including regular physical activity and weight reduction. Patient has lost 15 lbs on Weight Watchers and is very motivated to continue. Congratulated.    Follow up with Dr Quentin Ore as scheduled.    [facility] [address] [place], [place] [address] [phone]

## 2023-02-17 ENCOUNTER — Other Ambulatory Visit: Payer: Self-pay | Admitting: Cardiology

## 2023-02-17 ENCOUNTER — Encounter: Payer: Self-pay | Admitting: Cardiology

## 2023-02-17 NOTE — Telephone Encounter (Signed)
Pt last saw Clint Fenton, PA on 02/11/23, last labs 12/22/22 Creat 1.14, age 67, weight 130.8kg, based on specified criteria pt is on appropriate dosage of Eliquis 5mg  BID for afib.  Will refill rx.

## 2023-02-17 NOTE — Telephone Encounter (Signed)
Refill request

## 2023-02-25 ENCOUNTER — Ambulatory Visit: Payer: BC Managed Care – PPO | Admitting: Cardiology

## 2023-03-07 DIAGNOSIS — Z87891 Personal history of nicotine dependence: Secondary | ICD-10-CM | POA: Diagnosis not present

## 2023-03-11 ENCOUNTER — Encounter: Payer: Self-pay | Admitting: Cardiology

## 2023-03-25 DIAGNOSIS — D751 Secondary polycythemia: Secondary | ICD-10-CM | POA: Diagnosis not present

## 2023-04-19 NOTE — Progress Notes (Unsigned)
  Electrophysiology Office Follow up Visit Note:    Date:  04/20/2023   ID:  Jason Bolton, DOB 1956/09/14, MRN [id number]  PCP:  Patient, No Pcp Per  [facility] Cardiologist:  Debbe Odea, MD  [facility] Electrophysiologist:  Lanier Prude, MD    Interval History:    Jason Bolton is a 67 y.o. male who presents for a follow up visit.   He had a PVI on 01/14/2023. During the procedure, the PV's and posterior wall were isolated. He saw [facility] 02/11/2023. At that appointment he was maintaining sinus rhythm. He takes eliqluis for stroke ppx.  He knows that he is back in atrial fibrillation today but he feels much better than prior to the ablation given improved rates while in atrial fibrillation.  He would like to be in normal rhythm long-term.  He is still commuting from [employer].      Past medical, surgical, social and family history were reviewed.  ROS:   Please see the history of present illness.    All other systems reviewed and are negative.  EKGs/Labs/Other Studies Reviewed:    The following studies were reviewed today:  EKG:  The ekg ordered today demonstrates atrial fibrillation with a ventricular rate of 81 bpm.  QTc is 384 ms.   Physical Exam:    VS:  BP 138/70 (BP Location: Left Arm, Patient Position: Sitting, Cuff Size: Large)   Pulse 81   Ht 6\' 1"  (1.854 m)   Wt 280 lb 2 oz (127.1 kg)   SpO2 96%   BMI 36.96 kg/m     Wt Readings from Last 3 Encounters:  04/20/23 280 lb 2 oz (127.1 kg)  02/11/23 288 lb 6.4 oz (130.8 kg)  01/14/23 295 lb (133.8 kg)     GEN:  Well nourished, well developed in no acute distress CARDIAC: Irregularly irregular, no murmurs, rubs, gallops RESPIRATORY:  Clear to auscultation without rales, wheezing or rhonchi       ASSESSMENT:    1. Persistent atrial fibrillation (HCC)   2. Primary hypertension   3. HFrEF (heart failure with reduced ejection fraction) (HCC)    PLAN:    In order of problems listed  above:   #Persistent AF Doing well after his 12/2022 ablation but has a persistent recurrence of atrial fibrillation despite 2 prior ablations.  We discussed treatment options including a third ablation versus antiarrhythmic drugs.  I would recommend we pursue antiarrhythmic drugs at this point.  I discussed amiodarone and Tikosyn in detail during today's appointment.  Given his young age, I would favor Tikosyn.  He is in agreement.  I discussed the risks and benefits of Tikosyn use and he wishes to proceed with loading. Continue Eliquis. Stop lisinopril-hydrochlorothiazide Start lisinopril 40 mg by mouth once daily Plan for Tikosyn admission   #Hypertension At goal today.  Recommend checking blood pressures 1-2 times per week at home and recording the values.  Recommend bringing these recordings to the primary care physician.  #HFrEF NYHA II. Warm and dry on exam. Last EF 01/2022.  Continue current medical therapy.      Signed, Steffanie Dunn, MD, [facility], [facility] 04/20/2023 1:56 PM    Electrophysiology [facility]

## 2023-04-20 ENCOUNTER — Encounter: Payer: Self-pay | Admitting: Cardiology

## 2023-04-20 ENCOUNTER — Ambulatory Visit: Payer: BC Managed Care – PPO | Attending: Cardiology | Admitting: Cardiology

## 2023-04-20 VITALS — BP 138/70 | HR 81 | Ht 73.0 in | Wt 280.1 lb

## 2023-04-20 DIAGNOSIS — I5020 Unspecified systolic (congestive) heart failure: Secondary | ICD-10-CM

## 2023-04-20 DIAGNOSIS — I10 Essential (primary) hypertension: Secondary | ICD-10-CM

## 2023-04-20 DIAGNOSIS — I4819 Other persistent atrial fibrillation: Secondary | ICD-10-CM | POA: Diagnosis not present

## 2023-04-20 MED ORDER — LISINOPRIL 40 MG PO TABS: 40.0000 mg | ORAL_TABLET | Freq: Every day | ORAL | 3 refills | Status: AC

## 2023-04-20 MED ORDER — METOPROLOL TARTRATE 50 MG PO TABS: 50.0000 mg | ORAL_TABLET | Freq: Two times a day (BID) | ORAL | 2 refills | Status: AC

## 2023-04-20 NOTE — Patient Instructions (Signed)
Medication Instructions:  Your physician has recommended you make the following change in your medication:  1) STOP taking lisinopril-HCTZ 2) START taking lisinopril 40 mg   *If you need a refill on your cardiac medications before your next appointment, please call your pharmacy*  Follow-Up: At [facility], you and your health needs are our priority.  As part of our continuing mission to provide you with exceptional heart care, we have created designated Provider Care Teams.  These Care Teams include your primary Cardiologist (physician) and Advanced Practice Providers (APPs -  Physician Assistants and Nurse Practitioners) who all work together to provide you with the care you need, when you need it.  Your next appointment:   You will be contacted by Ventura Sellers, RN at the [facility] to arrange admission for Tikosyn     Tikosyn (Dofetilide) Hospital Admission   Prior to day of admission:  Check with drug insurance company for cost of drug to ensure affordability --- Dofetilide 500 mcg twice a day.  GoodRx is an option if insurance copay is unaffordable.    No Benadryl is allowed 3 days prior to admission.   Please ensure no missed doses of your anticoagulation (blood thinner) for 3 weeks prior to admission. If a dose is missed please notify our office immediately.   A pharmacist will review all your medications for potential interactions with Tikosyn. If any medication changes are needed prior to admission we will be in touch with you.   If any new medications are started AFTER your admission date is set with Radio producer. Please notify our office immediately so your medication list can be updated and reviewed by our pharmacist again.  On day of admission:  Tikosyn initiation requires a 3 night/4 day hospital stay with constant telemetry monitoring. You will have an EKG after each dose of Tikosyn as well as daily lab draws.   If the drug does not convert you to  normal rhythm a cardioversion after the 4th dose of Tikosyn.   Afib Clinic office visit on the morning of admission is needed for preliminary labs/ekg.   Time of admission is dependent on bed availability in the hospital. In some instances, you will be sent home until bed is available. Rarely admission can be delayed to the following day if hospital census prevents available beds.   You may bring personal belongings/clothing with you to the hospital. Please leave your suitcase in the car until you arrive in admissions.   Questions please call our office at [phone]

## 2023-04-21 ENCOUNTER — Telehealth: Payer: Self-pay | Admitting: Pharmacist

## 2023-04-21 NOTE — Telephone Encounter (Signed)
Medication list reviewed in anticipation of upcoming Tikosyn initiation. Patient is not taking any contraindicated medications. He uses Ventolin inhaler prn which can prolong QTc but is ok to continue. His HCTZ was stopped at EP appt yesterday.  Patient is anticoagulated on Eliquis 5mg  BID on the appropriate dose. Please ensure that patient has not missed any anticoagulation doses in the 3 weeks prior to Tikosyn initiation.   Patient will need to be counseled to avoid use of Benadryl while on Tikosyn and in the 2-3 days prior to Tikosyn initiation.

## 2023-04-22 ENCOUNTER — Encounter (HOSPITAL_COMMUNITY): Payer: Self-pay

## 2023-04-22 NOTE — Telephone Encounter (Signed)
Pt confirmed HCTZ was stopped as of 5/23

## 2023-05-05 ENCOUNTER — Telehealth (HOSPITAL_COMMUNITY): Payer: Self-pay

## 2023-05-05 NOTE — Telephone Encounter (Signed)
Tikosyn admission is pending review. Date of service 05/24/2023.  Pending authorization # [id number] Fax # [phone]

## 2023-05-06 DIAGNOSIS — D751 Secondary polycythemia: Secondary | ICD-10-CM | POA: Diagnosis not present

## 2023-05-09 NOTE — Telephone Encounter (Signed)
Approved for hospital admission on 6/25. [id number]

## 2023-05-24 ENCOUNTER — Encounter (HOSPITAL_COMMUNITY): Payer: Self-pay | Admitting: Cardiology

## 2023-05-24 ENCOUNTER — Inpatient Hospital Stay (HOSPITAL_COMMUNITY)
Admission: RE | Admit: 2023-05-24 | Discharge: 2023-05-27 | DRG: 309 | Disposition: A | Discharge status: Home / Self Care | Payer: BC Managed Care – PPO | Attending: Cardiology | Admitting: Cardiology

## 2023-05-24 ENCOUNTER — Ambulatory Visit (HOSPITAL_COMMUNITY)
Admission: RE | Admit: 2023-05-24 | Discharge: 2023-05-24 | Disposition: A | Discharge status: Home / Self Care | Payer: BC Managed Care – PPO | Source: Ambulatory Visit | Attending: Physician Assistant | Admitting: Physician Assistant

## 2023-05-24 ENCOUNTER — Other Ambulatory Visit: Payer: Self-pay

## 2023-05-24 VITALS — BP 176/102 | HR 89 | Ht 73.0 in | Wt 283.4 lb

## 2023-05-24 DIAGNOSIS — Z6837 Body mass index (BMI) 37.0-37.9, adult: Secondary | ICD-10-CM | POA: Diagnosis not present

## 2023-05-24 DIAGNOSIS — G4730 Sleep apnea, unspecified: Secondary | ICD-10-CM | POA: Diagnosis present

## 2023-05-24 DIAGNOSIS — Z7901 Long term (current) use of anticoagulants: Secondary | ICD-10-CM | POA: Diagnosis not present

## 2023-05-24 DIAGNOSIS — Z9889 Other specified postprocedural states: Secondary | ICD-10-CM

## 2023-05-24 DIAGNOSIS — Z880 Allergy status to penicillin: Secondary | ICD-10-CM

## 2023-05-24 DIAGNOSIS — D6869 Other thrombophilia: Secondary | ICD-10-CM | POA: Diagnosis present

## 2023-05-24 DIAGNOSIS — I10 Essential (primary) hypertension: Secondary | ICD-10-CM | POA: Diagnosis present

## 2023-05-24 DIAGNOSIS — I714 Abdominal aortic aneurysm, without rupture, unspecified: Secondary | ICD-10-CM | POA: Diagnosis present

## 2023-05-24 DIAGNOSIS — I4819 Other persistent atrial fibrillation: Secondary | ICD-10-CM | POA: Diagnosis not present

## 2023-05-24 DIAGNOSIS — E7800 Pure hypercholesterolemia, unspecified: Secondary | ICD-10-CM | POA: Diagnosis present

## 2023-05-24 DIAGNOSIS — E669 Obesity, unspecified: Secondary | ICD-10-CM | POA: Diagnosis not present

## 2023-05-24 DIAGNOSIS — Z87891 Personal history of nicotine dependence: Secondary | ICD-10-CM

## 2023-05-24 DIAGNOSIS — Z79899 Other long term (current) drug therapy: Secondary | ICD-10-CM

## 2023-05-24 DIAGNOSIS — J449 Chronic obstructive pulmonary disease, unspecified: Secondary | ICD-10-CM | POA: Diagnosis present

## 2023-05-24 LAB — BASIC METABOLIC PANEL WITH GFR
Anion gap: 8 (ref 5–15)
BUN: 15 mg/dL (ref 8–23)
CO2: 29 mmol/L (ref 22–32)
Calcium: 8.9 mg/dL (ref 8.9–10.3)
Chloride: 99 mmol/L (ref 98–111)
Creatinine, Ser: 1.01 mg/dL (ref 0.61–1.24)
GFR, Estimated: >60 mL/min
Glucose, Bld: 99 mg/dL (ref 70–99)
Potassium: 4.3 mmol/L (ref 3.5–5.1)
Sodium: 136 mmol/L (ref 135–145)

## 2023-05-24 LAB — MAGNESIUM: Magnesium: 2 mg/dL (ref 1.7–2.4)

## 2023-05-24 LAB — HIV ANTIBODY (ROUTINE TESTING W REFLEX): HIV Screen 4th Generation wRfx: NONREACTIVE

## 2023-05-24 MED ORDER — SIMVASTATIN 20 MG PO TABS
20.0000 mg | ORAL_TABLET | Freq: Every day | ORAL | Status: DC
  Administered 2023-05-24 – 2023-05-26 (×3): 20 mg via ORAL
  Filled 2023-05-24 (×3): qty 1

## 2023-05-24 MED ORDER — TAMSULOSIN HCL 0.4 MG PO CAPS
0.8000 mg | ORAL_CAPSULE | Freq: Every evening | ORAL | Status: DC
  Administered 2023-05-24 – 2023-05-26 (×3): 0.8 mg via ORAL
  Filled 2023-05-24 (×3): qty 2

## 2023-05-24 MED ORDER — POLYETHYL GLYCOL-PROPYL GLYCOL 0.4-0.3 % OP SOLN: 1.0000 [drp] | OPHTHALMIC | Status: DC | PRN

## 2023-05-24 MED ORDER — DOFETILIDE 500 MCG PO CAPS
500.0000 ug | ORAL_CAPSULE | Freq: Two times a day (BID) | ORAL | Status: DC
  Administered 2023-05-24 – 2023-05-27 (×6): 500 ug via ORAL
  Filled 2023-05-24 (×6): qty 1

## 2023-05-24 MED ORDER — LISINOPRIL 20 MG PO TABS
40.0000 mg | ORAL_TABLET | Freq: Every day | ORAL | Status: DC
  Administered 2023-05-25 – 2023-05-27 (×3): 40 mg via ORAL
  Filled 2023-05-24 (×3): qty 2

## 2023-05-24 MED ORDER — AMLODIPINE BESYLATE 10 MG PO TABS
10.0000 mg | ORAL_TABLET | Freq: Every day | ORAL | Status: DC
  Administered 2023-05-24 – 2023-05-27 (×4): 10 mg via ORAL
  Filled 2023-05-24 (×4): qty 1

## 2023-05-24 MED ORDER — MONTELUKAST SODIUM 10 MG PO TABS: 10.0000 mg | ORAL_TABLET | ORAL | Status: DC | PRN

## 2023-05-24 MED ORDER — SODIUM CHLORIDE 0.9% FLUSH
3.0000 mL | Freq: Two times a day (BID) | INTRAVENOUS | Status: DC
  Administered 2023-05-24 – 2023-05-27 (×7): 3 mL via INTRAVENOUS

## 2023-05-24 MED ORDER — HYDRALAZINE HCL 10 MG PO TABS
20.0000 mg | ORAL_TABLET | Freq: Three times a day (TID) | ORAL | Status: DC | PRN
  Administered 2023-05-24 – 2023-05-25 (×2): 20 mg via ORAL
  Filled 2023-05-24 (×2): qty 2

## 2023-05-24 MED ORDER — SODIUM CHLORIDE 0.9% FLUSH: 3.0000 mL | INTRAVENOUS | Status: DC | PRN

## 2023-05-24 MED ORDER — METOPROLOL TARTRATE 50 MG PO TABS
50.0000 mg | ORAL_TABLET | Freq: Two times a day (BID) | ORAL | Status: DC
  Administered 2023-05-24 – 2023-05-27 (×6): 50 mg via ORAL
  Filled 2023-05-24 (×6): qty 1

## 2023-05-24 MED ORDER — MAGNESIUM SULFATE 2 GM/50ML IV SOLN
2.0000 g | Freq: Once | INTRAVENOUS | Status: AC
  Administered 2023-05-24: 2 g via INTRAVENOUS
  Filled 2023-05-24: qty 50

## 2023-05-24 MED ORDER — ALBUTEROL SULFATE (2.5 MG/3ML) 0.083% IN NEBU
3.0000 mL | INHALATION_SOLUTION | RESPIRATORY_TRACT | Status: DC | PRN
  Administered 2023-05-26: 3 mL via RESPIRATORY_TRACT
  Filled 2023-05-24: qty 3

## 2023-05-24 MED ORDER — APIXABAN 5 MG PO TABS
5.0000 mg | ORAL_TABLET | Freq: Two times a day (BID) | ORAL | Status: DC
  Administered 2023-05-24 – 2023-05-27 (×6): 5 mg via ORAL
  Filled 2023-05-24 (×6): qty 1

## 2023-05-24 MED ORDER — SODIUM CHLORIDE 0.9 % IV SOLN: 250.0000 mL | INTRAVENOUS | Status: DC | PRN

## 2023-05-24 NOTE — Progress Notes (Signed)
Pharmacy: Dofetilide (Tikosyn) - Initial Consult Assessment and Electrolyte Replacement  Pharmacy consulted to assist in monitoring and replacing electrolytes in this 66 y.o. male admitted on 05/24/2023 undergoing dofetilide initiation. First dofetilide dose: 05/24/23  Assessment:  Patient Exclusion Criteria: If any screening criteria checked as "Yes", then  patient  should NOT receive dofetilide until criteria item is corrected.  If "Yes" please indicate correction plan.  YES  NO Patient  Exclusion Criteria Correction Plan   []   [x]   Baseline QTc interval is greater than or equal to 440 msec. IF above YES box checked dofetilide contraindicated unless patient has ICD; then may proceed if QTc 500-550 msec or with known ventricular conduction abnormalities may proceed with QTc 550-600 msec. QTc = 386    []   [x]   Patient is known or suspected to have a digoxin level greater than 2 ng/ml: No results found for: "DIGOXIN"     []   [x]   Creatinine clearance less than 20 ml/min (calculated using Cockcroft-Gault, actual body weight and serum creatinine): Estimated Creatinine Clearance: 97 mL/min (by C-G formula based on SCr of 1.01 mg/dL).     []   [x]  Patient has received drugs known to prolong the QT intervals within the last 48 hours (phenothiazines, tricyclics or tetracyclic antidepressants, erythromycin, H-1 antihistamines, cisapride, fluoroquinolones, azithromycin, ondansetron).   Updated information on QT prolonging agents is available to be searched on the following database:QT prolonging agents     []   [x]   Patient received a dose of hydrochlorothiazide (Oretic) alone or in any combination including triamterene (Dyazide, Maxzide) in the last 48 hours.    []   [x]  Patient received a medication known to increase dofetilide plasma concentrations prior to initial dofetilide dose:  Trimethoprim (Primsol, Proloprim) in the last 36 hours Verapamil (Calan, Verelan) in the last 36 hours  or a sustained release dose in the last 72 hours Megestrol (Megace) in the last 5 days  Cimetidine (Tagamet) in the last 6 hours Ketoconazole (Nizoral) in the last 24 hours Itraconazole (Sporanox) in the last 48 hours  Prochlorperazine (Compazine) in the last 36 hours     []   [x]   Patient is known to have a history of torsades de pointes; congenital or acquired long QT syndromes.    []   [x]   Patient has received a Class 1 antiarrhythmic with less than 2 half-lives since last dose. (Disopyramide, Quinidine, Procainamide, Lidocaine, Mexiletine, Flecainide, Propafenone)    []   [x]   Patient has received amiodarone therapy in the past 3 months or amiodarone level is greater than 0.3 ng/ml.    Labs:    Component Value Date/Time   K 4.3 05/24/2023 0928   K 4.3 08/18/2012 1343   MG 2.0 05/24/2023 1610     Plan: Select One Calculated CrCl  Dose q12h  [x]  > 60 ml/min 500 mcg  []  40-60 ml/min 250 mcg  []  20-40 ml/min 125 mcg   [x]   Physician selected initial dose within range recommended for patients level of renal function - will monitor for response.  []   Physician selected initial dose outside of range recommended for patients level of renal function - will discuss if the dose should be altered at this time.   Patient has been appropriately anticoagulated with apixaban 5mg  BID.  Potassium: K >/= 4: Appropriate to initiate Tikosyn, no replacement needed    Magnesium: Mg 1.8-2: Give Mg 2 gm IV x1 to prevent Mg from dropping below 1.8 - do not need to recheck Mg. Appropriate to initiate  Tikosyn   Fredonia Highland, PharmD, BCPS, [facility] Clinical Pharmacist [phone] Please check AMION for all [facility] Pharmacy numbers 05/24/2023

## 2023-05-24 NOTE — H&P (Signed)
Primary Care Physician: Patient, No Pcp Per Referring Physician: Dr. Lalla Brothers Primary EP: Dr Lalla Brothers Primary Cardiologist: Dr Ellery Plunk Jason Bolton is a 67 y.o. male with a h/o HTN, HLD, AAA, afib that who presents for follow up in the [facility]. Patient underwent afib ablation on 04/29/22 with Dr Lalla Brothers but had recurrence of his afib and had a DCCV on 10/04/22. On follow up, he was back in afib and underwent repeat ablation on 01/14/23. Patient saw Dr Lalla Brothers in follow up 04/20/23 and was back in afib. Dofetilide was recommended.    On follow up today, patient remains in rate controlled afib. He denies any missed doses of anticoagulation in the last 3 weeks. Hydrochlorothiazide was discontinued. No recent benadryl use.    Today, he denies symptoms of palpitations, chest pain, shortness of breath, orthopnea, PND, lower extremity edema, dizziness, presyncope, syncope, or neurologic sequela. The patient is tolerating medications without difficulties and is otherwise without complaint today.        Past Medical History:  Diagnosis Date   Allergy     Dysrhythmia     Hypercholesterolemia     Hypertension     Personal history of osteomyelitis      of the jaw- 2005-mandibular surgery   Polycythemia     Sleep apnea        ROS- All systems are reviewed and negative except as per the HPI above   Physical Exam:    Vitals:    05/24/23 0927  BP: (!) 176/102  Pulse: 89  Weight: 128.5 kg  Height: 6\' 1"  (1.854 m)       Wt Readings from Last 3 Encounters:  05/24/23 128.5 kg  04/20/23 127.1 kg  02/11/23 130.8 kg      GEN: Well nourished, well developed in no acute distress NECK: No JVD; No carotid bruits CARDIAC: Irregularly irregular rate and rhythm, no murmurs, rubs, gallops RESPIRATORY:  Clear to auscultation without rales, wheezing or rhonchi  ABDOMEN: Soft, non-tender, non-distended EXTREMITIES:  No edema; No deformity      EKG today  demonstrates  Afib Vent. rate 89 BPM PR interval * ms QRS duration 84 ms QT/QTcB 364/442 ms     CHA2DS2-VASc Score = 2  The patient's score is based upon: CHF History: 0 HTN History: 1 Diabetes History: 0 Stroke History: 0 Vascular Disease History: 0 Age Score: 1 Gender Score: 0         ASSESSMENT AND PLAN: Persistent Atrial Fibrillation (ICD10:  I48.19) The patient's CHA2DS2-VASc score is 2, indicating a 2.2% annual risk of stroke.   S/p afib ablation 04/29/22 with repeat ablation 01/24/23 Patient presents for dofetilide admission. Continue Eliquis 5 mg BID, states no missed doses in the last 3 weeks. No recent benadryl use PharmD has screened medications, hydrochlorothiazide discontinued.  QTc in SR 386 ms Labs today show creatinine at 1.01, K+ 4.3 and mag 2.0, CrCl calculated at 125 mL/min Continue Lopressor 50 mg BID   Secondary Hypercoagulable State (ICD10:  D68.69) The patient is at significant risk for stroke/thromboembolism based upon his CHA2DS2-VASc Score of 2.  Continue Apixaban (Eliquis).    HTN Elevated today. Lisinopril-hydrochlorothiazide discontinued and lisinopril increased. May need to add alternate agent if BP remains persistently elevated.    Obesity Body mass index is 37.39 kg/m.  Encouraged lifestyle modification     To be admitted later today once a bed becomes available.      [employer]  [facility] PA-C Afib Clinic [facility] [address] [place], [place] [address] [phone]     -----------------------------------  I have seen, examined the patient, and reviewed the above assessment and plan.    Jason Bolton is a 68 year old man known to me from the outpatient setting who presents for dofetilide loading.  He has persistent atrial fibrillation with a prior ablation April 29, 2022 and January 24, 2023.  Despite multiple ablations he is continue to have symptomatic atrial fibrillation.  GEN: No acute distress.   Cardiac: Irregularly  irregular, no murmurs, rubs, or gallops.  Psych: Normal affect   EKG personally reviewed shows a QTc 440 ms.  Shorter in sinus rhythm.  Creatinine 1.01 today.  Potassium 4.3.  Magnesium 2.0.  #Persistent atrial fibrillation Multiple prior ablations with continued symptomatic persistent atrial fibrillation. Creatinine and QTc acceptable for initial loading dose today.  #Hypertension Above goal today.  I will add amlodipine 10 mg by mouth once daily and as needed hydralazine.  Prior to this hospitalization hydrochlorothiazide was stopped due to its interaction with dofetilide.   Lanier Prude, MD 05/24/2023 5:54 PM

## 2023-05-24 NOTE — Plan of Care (Signed)
°  Problem: Coping: °Goal: Level of anxiety will decrease °Outcome: Progressing °  °

## 2023-05-24 NOTE — Progress Notes (Signed)
   05/24/23 2140  BiPAP/CPAP/SIPAP  $ Non-Invasive Home Ventilator  Initial  $ Face Mask Large  Yes  BiPAP/CPAP/SIPAP Pt Type Adult  BiPAP/CPAP/SIPAP Resmed  Mask Type Nasal mask  Mask Size Large  EPAP 10 cmH2O  FiO2 (%) 21 %  BiPAP/CPAP /SiPAP Vitals  Pulse Rate 74  Resp 18  SpO2 95 %  MEWS Score/Color  MEWS Score 0  MEWS Score Color [place] Si

## 2023-05-24 NOTE — TOC CM/SW Note (Signed)
Transition of Care [facility]) - Inpatient Brief Assessment   Patient Details  Name: RAJAH TAGLIAFERRO MRN: [id number] Date of Birth: 03/12/1956  Transition of Care [facility]) CM/SW Contact:    Gala Lewandowsky, RN Phone Number: 05/24/2023, 12:14 PM   Clinical Narrative: Transition of Care Department [facility]) has reviewed the patient. Patient presented for Tikosyn Load. Benefits check submitted for cost. Case Manager will discuss cost and pharmacy of choice as the patient progresses.  Transition of Care Asessment: Insurance and Status: Insurance coverage has been reviewed Patient has primary care physician:  (Cardiology)  Prior/Current Home Services: No current home services Social Determinants of Health Reivew: SDOH reviewed no interventions necessary Readmission risk has been reviewed: Yes Transition of care needs: transition of care needs identified, TOC will continue to follow

## 2023-05-24 NOTE — Progress Notes (Signed)
   Primary Care Physician: Patient, No Pcp Per Referring Physician: Dr. Lalla Brothers Primary EP: Dr Lalla Brothers Primary Cardiologist: Dr Ellery Plunk LUE DUBUQUE is a 67 y.o. male with a h/o HTN, HLD, AAA, afib that who presents for follow up in the [facility]. Patient underwent afib ablation on 04/29/22 with Dr Lalla Brothers but had recurrence of his afib and had a DCCV on 10/04/22. On follow up, he was back in afib and underwent repeat ablation on 01/14/23. Patient saw Dr Lalla Brothers in follow up 04/20/23 and was back in afib. Dofetilide was recommended.   On follow up today, patient remains in rate controlled afib. He denies any missed doses of anticoagulation in the last 3 weeks. Hydrochlorothiazide was discontinued. No recent benadryl use.   Today, he denies symptoms of palpitations, chest pain, shortness of breath, orthopnea, PND, lower extremity edema, dizziness, presyncope, syncope, or neurologic sequela. The patient is tolerating medications without difficulties and is otherwise without complaint today.   Past Medical History:  Diagnosis Date   Allergy    Dysrhythmia    Hypercholesterolemia    Hypertension    Personal history of osteomyelitis    of the jaw- 2005-mandibular surgery   Polycythemia    Sleep apnea     ROS- All systems are reviewed and negative except as per the HPI above  Physical Exam: Vitals:   05/24/23 0927  BP: (!) 176/102  Pulse: 89  Weight: 128.5 kg  Height: 6\' 1"  (1.854 m)   Wt Readings from Last 3 Encounters:  05/24/23 128.5 kg  04/20/23 127.1 kg  02/11/23 130.8 kg    GEN: Well nourished, well developed in no acute distress NECK: No JVD; No carotid bruits CARDIAC: Irregularly irregular rate and rhythm, no murmurs, rubs, gallops RESPIRATORY:  Clear to auscultation without rales, wheezing or rhonchi  ABDOMEN: Soft, non-tender, non-distended EXTREMITIES:  No edema; No deformity    EKG today demonstrates  Afib Vent. rate 89  BPM PR interval * ms QRS duration 84 ms QT/QTcB 364/442 ms   CHA2DS2-VASc Score = 2  The patient's score is based upon: CHF History: 0 HTN History: 1 Diabetes History: 0 Stroke History: 0 Vascular Disease History: 0 Age Score: 1 Gender Score: 0       ASSESSMENT AND PLAN: Persistent Atrial Fibrillation (ICD10:  I48.19) The patient's CHA2DS2-VASc score is 2, indicating a 2.2% annual risk of stroke.   S/p afib ablation 04/29/22 with repeat ablation 01/24/23 Patient presents for dofetilide admission. Continue Eliquis 5 mg BID, states no missed doses in the last 3 weeks. No recent benadryl use PharmD has screened medications, hydrochlorothiazide discontinued.  QTc in SR 386 ms Labs today show creatinine at 1.01, K+ 4.3 and mag 2.0, CrCl calculated at 125 mL/min Continue Lopressor 50 mg BID  Secondary Hypercoagulable State (ICD10:  D68.69) The patient is at significant risk for stroke/thromboembolism based upon his CHA2DS2-VASc Score of 2.  Continue Apixaban (Eliquis).   HTN Elevated today. Lisinopril-hydrochlorothiazide discontinued and lisinopril increased. May need to add alternate agent if BP remains persistently elevated.   Obesity Body mass index is 37.39 kg/m.  Encouraged lifestyle modification   To be admitted later today once a bed becomes available.    Jorja Loa PA-C Afib Clinic [facility] [address] [place], [place] [address] [phone]

## 2023-05-25 ENCOUNTER — Other Ambulatory Visit (HOSPITAL_COMMUNITY): Payer: Self-pay

## 2023-05-25 ENCOUNTER — Encounter: Payer: Self-pay | Admitting: Oncology

## 2023-05-25 DIAGNOSIS — I4819 Other persistent atrial fibrillation: Secondary | ICD-10-CM | POA: Diagnosis not present

## 2023-05-25 LAB — BASIC METABOLIC PANEL
Anion gap: 7 (ref 5–15)
BUN: 12 mg/dL (ref 8–23)
CO2: 29 mmol/L (ref 22–32)
Calcium: 8.6 mg/dL — ABNORMAL LOW (ref 8.9–10.3)
Chloride: 99 mmol/L (ref 98–111)
Creatinine, Ser: 0.91 mg/dL (ref 0.61–1.24)
GFR, Estimated: >60 mL/min (ref >60)
Glucose, Bld: 100 mg/dL — ABNORMAL HIGH (ref 70–99)
Potassium: 3.9 mmol/L (ref 3.5–5.1)
Sodium: 135 mmol/L (ref 135–145)

## 2023-05-25 LAB — MAGNESIUM: Magnesium: 1.8 mg/dL (ref 1.7–2.4)

## 2023-05-25 MED ORDER — POTASSIUM CHLORIDE CRYS ER 20 MEQ PO TBCR
40.0000 meq | EXTENDED_RELEASE_TABLET | Freq: Once | ORAL | Status: AC
  Administered 2023-05-25: 40 meq via ORAL
  Filled 2023-05-25: qty 2

## 2023-05-25 MED ORDER — ACETAMINOPHEN 500 MG PO TABS
1000.0000 mg | ORAL_TABLET | Freq: Three times a day (TID) | ORAL | Status: DC | PRN
  Administered 2023-05-25 – 2023-05-27 (×3): 1000 mg via ORAL
  Filled 2023-05-25 (×3): qty 2

## 2023-05-25 MED ORDER — MAGNESIUM SULFATE 2 GM/50ML IV SOLN
2.0000 g | Freq: Once | INTRAVENOUS | Status: AC
  Administered 2023-05-25: 2 g via INTRAVENOUS
  Filled 2023-05-25: qty 50

## 2023-05-25 NOTE — Progress Notes (Signed)
Post dose EKG is reviewed QTc stable Continue tikosyn dose tonight  Onda Kattner, PA-C 

## 2023-05-25 NOTE — H&P (View-Only) (Signed)
Post dose EKG is reviewed QTc stable Continue tikosyn dose tonight  Francis Dowse, PA-C

## 2023-05-25 NOTE — Progress Notes (Signed)
Rounding Note    Patient Name: Jason Bolton Date of Encounter: 05/25/2023  [facility] Cardiologist: Debbe Odea, MD   Subjective   Doing well, wife at bedside  Inpatient Medications    Scheduled Meds:  amLODipine  10 mg Oral Daily   apixaban  5 mg Oral BID   dofetilide  500 mcg Oral BID   lisinopril  40 mg Oral Daily   metoprolol tartrate  50 mg Oral BID   simvastatin  20 mg Oral QHS   sodium chloride flush  3 mL Intravenous Q12H   tamsulosin  0.8 mg Oral QPM   Continuous Infusions:  sodium chloride     magnesium sulfate bolus IVPB 2 g (05/25/23 0823)   PRN Meds: sodium chloride, acetaminophen, albuterol, hydrALAZINE, montelukast, sodium chloride flush   Vital Signs    Vitals:   05/24/23 2010 05/24/23 2140 05/24/23 2318 05/25/23 0527  BP: 137/87  134/89 (!) 155/89  Pulse: 92 74 72 73  Resp: 18 18 18 18   Temp: 98.2 F (36.8 C)  98.7 F (37.1 C) 98.5 F (36.9 C)  TempSrc: Oral  Oral Oral  SpO2: 92% 95% 91% 91%  Weight:      Height:        Intake/Output Summary (Last 24 hours) at 05/25/2023 4098 Last data filed at 05/24/2023 1623 Gross per 24 hour  Intake 50 ml  Output --  Net 50 ml      05/24/2023   11:25 AM 05/24/2023    9:27 AM 04/20/2023    1:05 PM  Last 3 Weights  Weight (lbs) 283 lb 12.8 oz 283 lb 6.4 oz 280 lb 2 oz  Weight (kg) 128.731 kg 128.549 kg 127.064 kg      Telemetry    AFib  80's-90's, rare OVC, couplet - Personally Reviewed  ECG    AFib 76bpm, - Personally Reviewed  Physical Exam   GEN: No acute distress.   Neck: No JVD Cardiac: irreg-irreg, no murmurs, rubs, or gallops.  Respiratory: CTA b/l GI: Soft, nontender, non-distended  MS: No edema; No deformity. Neuro:  Nonfocal  Psych: Normal affect   Labs    High Sensitivity Troponin:  No results for input(s): "TROPONINIHS" in the last 720 hours.   Chemistry Recent Labs  Lab 05/24/23 0928 05/25/23 0227  NA 136 135  K 4.3 3.9  CL 99 99   CO2 29 29  GLUCOSE 99 100*  BUN 15 12  CREATININE 1.01 0.91  CALCIUM 8.9 8.6*  MG 2.0 1.8  GFRNONAA >60 >60  ANIONGAP 8 7    Lipids No results for input(s): "CHOL", "TRIG", "HDL", "LABVLDL", "LDLCALC", "CHOLHDL" in the last 168 hours.  HematologyNo results for input(s): "WBC", "RBC", "HGB", "HCT", "MCV", "MCH", "MCHC", "RDW", "PLT" in the last 168 hours. Thyroid No results for input(s): "TSH", "FREET4" in the last 168 hours.  BNPNo results for input(s): "BNP", "PROBNP" in the last 168 hours.  DDimer No results for input(s): "DDIMER" in the last 168 hours.   Radiology    No results found.  Cardiac Studies    02/10/22: TTE 1. Left ventricular ejection fraction, by estimation, is 55%. The left  ventricle has normal function. The left ventricle has no regional wall  motion abnormalities. There is mild left ventricular hypertrophy. Left  ventricular diastolic parameters are  indeterminate.   2. Right ventricular systolic function is normal. The right ventricular  size is normal.   3. Left atrial size was mildly dilated.  4. The mitral valve is normal in structure. Mild mitral valve  regurgitation.   5. The aortic valve was not well visualized. Aortic valve regurgitation  is mild. No aortic stenosis is present.   Patient Profile     67 y.o. male w/PMHx of HTN, HLD, AAA, AFib admitted for Tikosyn initiation  Hx of PVI ablation 04/29/22, 01/14/23  Assessment & Plan    Persistent AFib CHA2DS2Vasc is 2, on Eliquis Tikosyn load is in progress K+ 3.9 Mag 1.8 Creat 0.91 stable QTc is stable  DCCV tomorrow if not in SR, pt aware, discussed risks/benefits, he is agreeable   HTN Home med  For questions or updates, please contact [facility] Please consult [email or web address] for contact info under        Signed, Sheilah Pigeon, PA-C  05/25/2023, 9:22 AM

## 2023-05-25 NOTE — TOC Benefit Eligibility Note (Signed)
Pharmacy Patient Advocate Encounter  Insurance verification completed.    The patient is insured through  [employer] [place] for dofetilide (Tikosyn) 500 mcg capsulesand the current 30 day co-pay is $20.00.   This test claim was processed through [facility]- copay amounts may vary at other pharmacies due to pharmacy/plan contracts, or as the patient moves through the different stages of their insurance plan.    Roland Earl, CPHT Pharmacy Patient Advocate Specialist [facility] Patient Advocate Team Direct Number: [phone]  Fax: [phone]

## 2023-05-25 NOTE — Progress Notes (Signed)
Pharmacy: Dofetilide (Tikosyn) - Follow Up Assessment and Electrolyte Replacement  Pharmacy consulted to assist in monitoring and replacing electrolytes in this 67 y.o. male admitted on 05/24/2023 undergoing dofetilide initiation. First dofetilide dose: 05/24/23  Labs:    Component Value Date/Time   K 3.9 05/25/2023 0227   K 4.3 08/18/2012 1343   MG 1.8 05/25/2023 0227     Plan: Potassium: K 3.8-3.9:  Give KCl 40 mEq po x1   Magnesium: Mg 1.8-2: Give Mg 2 gm IV x1    Fredonia Highland, PharmD, [place], [facility] Clinical Pharmacist [phone] Please check AMION for all [facility] Pharmacy numbers 05/25/2023

## 2023-05-25 NOTE — Plan of Care (Signed)

## 2023-05-25 NOTE — Progress Notes (Signed)
   05/25/23 2333  BiPAP/CPAP/SIPAP  $ Face Mask Large  Yes  BiPAP/CPAP/SIPAP Pt Type Adult  BiPAP/CPAP/SIPAP Resmed  Mask Type Nasal mask  EPAP 10 cmH2O  FiO2 (%) 21 %  Patient Home Equipment No  Safety Check Completed by RT for Home Unit Yes, no issues noted

## 2023-05-26 ENCOUNTER — Inpatient Hospital Stay (HOSPITAL_COMMUNITY): Payer: BC Managed Care – PPO | Admitting: Anesthesiology

## 2023-05-26 ENCOUNTER — Encounter (HOSPITAL_COMMUNITY): Payer: Self-pay | Admitting: Cardiology

## 2023-05-26 ENCOUNTER — Encounter (HOSPITAL_COMMUNITY)
Admission: RE | Disposition: A | Discharge status: Home / Self Care | Payer: Self-pay | Source: Home / Self Care | Attending: Cardiology

## 2023-05-26 DIAGNOSIS — I10 Essential (primary) hypertension: Secondary | ICD-10-CM | POA: Diagnosis not present

## 2023-05-26 DIAGNOSIS — I4819 Other persistent atrial fibrillation: Secondary | ICD-10-CM | POA: Diagnosis not present

## 2023-05-26 HISTORY — PX: CARDIOVERSION: SHX1299

## 2023-05-26 LAB — BASIC METABOLIC PANEL
Anion gap: 8 (ref 5–15)
BUN: 9 mg/dL (ref 8–23)
CO2: 29 mmol/L (ref 22–32)
Calcium: 9.1 mg/dL (ref 8.9–10.3)
Chloride: 100 mmol/L (ref 98–111)
Creatinine, Ser: 1.01 mg/dL (ref 0.61–1.24)
GFR, Estimated: >60 mL/min (ref >60)
Glucose, Bld: 105 mg/dL — ABNORMAL HIGH (ref 70–99)
Potassium: 4.3 mmol/L (ref 3.5–5.1)
Sodium: 137 mmol/L (ref 135–145)

## 2023-05-26 LAB — MAGNESIUM: Magnesium: 1.8 mg/dL (ref 1.7–2.4)

## 2023-05-26 SURGERY — CARDIOVERSION
Anesthesia: General

## 2023-05-26 MED ORDER — LIDOCAINE 2% (20 MG/ML) 5 ML SYRINGE
INTRAMUSCULAR | Status: DC | PRN
  Administered 2023-05-26: 20 mg via INTRAVENOUS

## 2023-05-26 MED ORDER — MAGNESIUM SULFATE 2 GM/50ML IV SOLN
2.0000 g | Freq: Once | INTRAVENOUS | Status: AC
  Administered 2023-05-26: 2 g via INTRAVENOUS
  Filled 2023-05-26: qty 50

## 2023-05-26 MED ORDER — PROPOFOL 10 MG/ML IV BOLUS
INTRAVENOUS | Status: DC | PRN
  Administered 2023-05-26: 100 mg via INTRAVENOUS

## 2023-05-26 MED ORDER — SODIUM CHLORIDE 0.9 % IV SOLN: INTRAVENOUS | Status: DC

## 2023-05-26 SURGICAL SUPPLY — 1 items: ELECT DEFIB PAD ADLT CADENCE (PAD) ×1 IMPLANT

## 2023-05-26 NOTE — CV Procedure (Signed)
   Electrical Cardioversion Procedure Note Jason Bolton [id number] [birth year]  Procedure: Electrical Cardioversion Indications:  Atrial Fibrillation  Time Out: Verified patient identification, verified procedure,medications/allergies/relevent history reviewed, required imaging and test results available.  Performed  Procedure Details  The patient signed informed consent.   The patient was NPO past midnight. Has had therapeutic anticoagulation with Eliquis greater than 3 weeks. The patient denies any interruption of anticoagulation.  Anesthesia was administered by Dr. Jean Rosenthal.  Adequate airway was maintained throughout and vital followed per protocol.  He was cardioverted x 1 with 200J of biphasic synchronized energy.  He converted to NSR.  There were no apparent complications.  The patient tolerated the procedure well and had normal neuro status and respiratory status post procedure with vitals stable as recorded elsewhere.     IMPRESSION:  Successful cardioversion of atrial fibrillation   Follow up: Will be transferred to the medical floors.  Karla Pavone 05/26/2023, 11:43 AM

## 2023-05-26 NOTE — Anesthesia Postprocedure Evaluation (Signed)
Anesthesia Post Note  Patient: Jason Bolton  Procedure(s) Performed: CARDIOVERSION     Patient location during evaluation: Cath Lab Anesthesia Type: General Level of consciousness: awake and alert, patient cooperative and oriented Pain management: pain level controlled Vital Signs Assessment: post-procedure vital signs reviewed and stable Respiratory status: spontaneous breathing, nonlabored ventilation and respiratory function stable Cardiovascular status: blood pressure returned to baseline and stable Postop Assessment: no apparent nausea or vomiting Anesthetic complications: no   No notable events documented.  Last Vitals:  Vitals:   05/26/23 1205 05/26/23 1215  BP: 127/80 135/86  Pulse: (!) 56 (!) 55  Resp: (!) 23 15  Temp: 36.6 C   SpO2: 92% 93%    Last Pain:  Vitals:   05/26/23 1205  TempSrc: Temporal  PainSc: 0-No pain                 Jaquane Boughner,E. Safire Gordin

## 2023-05-26 NOTE — Anesthesia Procedure Notes (Signed)
Procedure Name: MAC Date/Time: 05/26/2023 11:38 AM  Performed by: Aundria Rud, CRNAPre-anesthesia Checklist: Patient identified, Emergency Drugs available, Suction available, Patient being monitored and Timeout performed Patient Re-evaluated:Patient Re-evaluated prior to induction Oxygen Delivery Method: Nasal cannula Preoxygenation: Pre-oxygenation with 100% oxygen Induction Type: IV induction Placement Confirmation: positive ETCO2 and CO2 detector Dental Injury: Teeth and Oropharynx as per pre-operative assessment

## 2023-05-26 NOTE — Progress Notes (Signed)
Rounding Note    Patient Name: Jason Bolton Date of Encounter: 05/26/2023   [facility] Cardiologist: Debbe Odea, MD   Subjective   Doing well, tolerating drug  Inpatient Medications    Scheduled Meds:  amLODipine  10 mg Oral Daily   apixaban  5 mg Oral BID   dofetilide  500 mcg Oral BID   lisinopril  40 mg Oral Daily   metoprolol tartrate  50 mg Oral BID   simvastatin  20 mg Oral QHS   sodium chloride flush  3 mL Intravenous Q12H   tamsulosin  0.8 mg Oral QPM   Continuous Infusions:  sodium chloride     sodium chloride     magnesium sulfate bolus IVPB 2 g (05/26/23 0824)   PRN Meds: sodium chloride, acetaminophen, albuterol, hydrALAZINE, montelukast, sodium chloride flush   Vital Signs    Vitals:   05/25/23 1728 05/25/23 1959 05/26/23 0347 05/26/23 0729  BP: (!) 151/93 (!) 147/83 (!) 145/83 (!) 168/97  Pulse:    77  Resp:  20 (!) 24 18  Temp:  97.6 F (36.4 C)  98.6 F (37 C)  TempSrc:  Oral  Oral  SpO2:  92% 90% 93%  Weight:      Height:        Intake/Output Summary (Last 24 hours) at 05/26/2023 0852 Last data filed at 05/26/2023 0734 Gross per 24 hour  Intake --  Output 350 ml  Net -350 ml      05/24/2023   11:25 AM 05/24/2023    9:27 AM 04/20/2023    1:05 PM  Last 3 Weights  Weight (lbs) 283 lb 12.8 oz 283 lb 6.4 oz 280 lb 2 oz  Weight (kg) 128.731 kg 128.549 kg 127.064 kg      Telemetry    AFib  80's-90's- Personally Reviewed  ECG    AFib 67bpm, - Personally Reviewed, as well as by Dr. Lalla Brothers  Physical Exam   Exam today is unchanged GEN: No acute distress.   Neck: No JVD Cardiac: irreg-irreg, no murmurs, rubs, or gallops.  Respiratory: CTA b/l GI: Soft, nontender, non-distended  MS: No edema; No deformity. Neuro:  Nonfocal  Psych: Normal affect   Labs    High Sensitivity Troponin:  No results for input(s): "TROPONINIHS" in the last 720 hours.   Chemistry Recent Labs  Lab 05/24/23 0928  05/25/23 0227 05/26/23 0245  NA 136 135 137  K 4.3 3.9 4.3  CL 99 99 100  CO2 29 29 29   GLUCOSE 99 100* 105*  BUN 15 12 9   CREATININE 1.01 0.91 1.01  CALCIUM 8.9 8.6* 9.1  MG 2.0 1.8 1.8  GFRNONAA >60 >60 >60  ANIONGAP 8 7 8     Lipids No results for input(s): "CHOL", "TRIG", "HDL", "LABVLDL", "LDLCALC", "CHOLHDL" in the last 168 hours.  HematologyNo results for input(s): "WBC", "RBC", "HGB", "HCT", "MCV", "MCH", "MCHC", "RDW", "PLT" in the last 168 hours. Thyroid No results for input(s): "TSH", "FREET4" in the last 168 hours.  BNPNo results for input(s): "BNP", "PROBNP" in the last 168 hours.  DDimer No results for input(s): "DDIMER" in the last 168 hours.   Radiology    No results found.  Cardiac Studies    02/10/22: TTE 1. Left ventricular ejection fraction, by estimation, is 55%. The left  ventricle has normal function. The left ventricle has no regional wall  motion abnormalities. There is mild left ventricular hypertrophy. Left  ventricular diastolic parameters are  indeterminate.  2. Right ventricular systolic function is normal. The right ventricular  size is normal.   3. Left atrial size was mildly dilated.   4. The mitral valve is normal in structure. Mild mitral valve  regurgitation.   5. The aortic valve was not well visualized. Aortic valve regurgitation  is mild. No aortic stenosis is present.   Patient Profile     67 y.o. male w/PMHx of HTN, HLD, AAA, AFib admitted for Tikosyn initiation  Hx of PVI ablation 04/29/22, 01/14/23  Assessment & Plan    Persistent AFib CHA2DS2Vasc is 2, on Eliquis Tikosyn load is in progress K+ 4.3 Mag 1.8 Creat 1.01, dose remains appropriate QTc is stable  DCCV this afternoon, he remains agreeable Nursing is working on tele box, though have made his tethered telemetry mobile so he can ambulate   HTN Home meds  For questions or updates, please contact [facility] Please consult [email or web address] for contact  info under        Signed, Sheilah Pigeon, PA-C  05/26/2023, 8:52 AM

## 2023-05-26 NOTE — Transfer of Care (Signed)
Immediate Anesthesia Transfer of Care Note  Patient: Jason Bolton  Procedure(s) Performed: CARDIOVERSION  Patient Location: PACU  Anesthesia Type:General  Level of Consciousness: awake, drowsy, and patient cooperative  Airway & Oxygen Therapy: Patient Spontanous Breathing and Patient connected to nasal cannula oxygen  Post-op Assessment: Report given to RN, Post -op Vital signs reviewed and stable, and Patient moving all extremities X 4  Post vital signs: Reviewed and stable  Last Vitals:  Vitals Value Taken Time  BP    Temp    Pulse 56 05/26/23 1149  Resp 39 05/26/23 1149  SpO2 93 % 05/26/23 1149  Vitals shown include unvalidated device data.  Last Pain:  Vitals:   05/26/23 1115  TempSrc:   PainSc: 0-No pain      Patients Stated Pain Goal: 0 (05/25/23 2015)  Complications: No notable events documented.

## 2023-05-26 NOTE — Anesthesia Preprocedure Evaluation (Addendum)
Anesthesia Evaluation  Patient identified by MRN, date of birth, ID band Patient awake    Reviewed: Allergy & Precautions, NPO status , Patient's Chart, lab work & pertinent test results, reviewed documented beta blocker date and time   History of Anesthesia Complications Negative for: history of anesthetic complications  Airway Mallampati: II  TM Distance: >3 FB Neck ROM: Full    Dental  (+) Dental Advisory Given   Pulmonary sleep apnea and Continuous Positive Airway Pressure Ventilation , COPD,  COPD inhaler, former smoker   breath sounds clear to auscultation       Cardiovascular hypertension, Pt. on medications and Pt. on home beta blockers (-) angina + dysrhythmias Atrial Fibrillation  Rhythm:Irregular Rate:Normal  '23 ECHO: EF 55%.  1. The LV has normal function, no regional wall motion abnormalities. There is mild LVH.   2. RV systolic function is normal. The right ventricular size is normal.   3. Left atrial size was mildly dilated.   4. The mitral valve is normal in structure. Mild MR.   5. The aortic valve was not well visualized. AI is mild. No aortic stenosis is present.     Neuro/Psych negative neurological ROS     GI/Hepatic negative GI ROS, Neg liver ROS,,,  Endo/Other  BMI 37  Renal/GU negative Renal ROS     Musculoskeletal   Abdominal  (+) + obese  Peds  Hematology eliquis   Anesthesia Other Findings   Reproductive/Obstetrics                              Anesthesia Physical Anesthesia Plan  ASA: 3  Anesthesia Plan: General   Post-op Pain Management: Minimal or no pain anticipated   Induction: Intravenous  PONV Risk Score and Plan: 2 and Treatment may vary due to age or medical condition  Airway Management Planned: Natural Airway and Mask  Additional Equipment: None  Intra-op Plan:   Post-operative Plan:   Informed Consent: I have reviewed the patients  History and Physical, chart, labs and discussed the procedure including the risks, benefits and alternatives for the proposed anesthesia with the patient or authorized representative who has indicated his/her understanding and acceptance.     Dental advisory given  Plan Discussed with: CRNA and Surgeon  Anesthesia Plan Comments:          Anesthesia Quick Evaluation

## 2023-05-26 NOTE — Progress Notes (Signed)
Pharmacy: Dofetilide (Tikosyn) - Follow Up Assessment and Electrolyte Replacement  Pharmacy consulted to assist in monitoring and replacing electrolytes in this 67 y.o. male admitted on 05/24/2023 undergoing dofetilide initiation. First dofetilide dose: 05/24/23  Labs:    Component Value Date/Time   K 4.3 05/26/2023 0245   K 4.3 08/18/2012 1343   MG 1.8 05/26/2023 0245     Plan: Potassium: K >/= 4: No additional supplementation needed  Magnesium: Mg 1.8-2: Give Mg 2 gm IV x1   Thank you for allowing pharmacy to participate in this patient's care,  Sherron Monday, PharmD, BCCCP Clinical Pharmacist  Phone: [phone] 05/26/2023 7:24 AM  Please check AMION for all [facility] Pharmacy phone numbers After 10:00 PM, call Main Pharmacy [phone]

## 2023-05-26 NOTE — Progress Notes (Signed)
Post dose EKG reviewed Stable QTc Continue tikosyn load  Francis Dowse, PA-C

## 2023-05-26 NOTE — Interval H&P Note (Signed)
History and Physical Interval Note:  05/26/2023 11:29 AM  Jason Bolton  has presented today for surgery, with the diagnosis of afib.  The various methods of treatment have been discussed with the patient and family. After consideration of risks, benefits and other options for treatment, the patient has consented to  Procedure(s): CARDIOVERSION (N/A) as a surgical intervention.  The patient's history has been reviewed, patient examined, no change in status, stable for surgery.  I have reviewed the patient's chart and labs.  Questions were answered to the patient's satisfaction.     [name]

## 2023-05-26 NOTE — Care Management (Signed)
05-26-23 1440 Case Manager spoke with the patient regarding Tikosyn co pay cost. Patient is agreeable to cost and would like to have the initial Rx filled via [facility] and the Rx refills 90 day supply escribed to [facility] [address] [place], [place] [address]. No further needs identified at this time.

## 2023-05-27 ENCOUNTER — Other Ambulatory Visit (HOSPITAL_COMMUNITY): Payer: Self-pay

## 2023-05-27 ENCOUNTER — Encounter (HOSPITAL_COMMUNITY): Payer: Self-pay | Admitting: Cardiology

## 2023-05-27 DIAGNOSIS — I4819 Other persistent atrial fibrillation: Secondary | ICD-10-CM | POA: Diagnosis not present

## 2023-05-27 LAB — BASIC METABOLIC PANEL
Anion gap: 10 (ref 5–15)
BUN: 13 mg/dL (ref 8–23)
CO2: 27 mmol/L (ref 22–32)
Calcium: 9 mg/dL (ref 8.9–10.3)
Chloride: 99 mmol/L (ref 98–111)
Creatinine, Ser: 0.9 mg/dL (ref 0.61–1.24)
GFR, Estimated: >60 mL/min (ref >60)
Glucose, Bld: 97 mg/dL (ref 70–99)
Potassium: 4 mmol/L (ref 3.5–5.1)
Sodium: 136 mmol/L (ref 135–145)

## 2023-05-27 LAB — MAGNESIUM: Magnesium: 1.8 mg/dL (ref 1.7–2.4)

## 2023-05-27 MED ORDER — DOFETILIDE 500 MCG PO CAPS
500.0000 ug | ORAL_CAPSULE | Freq: Two times a day (BID) | ORAL | 6 refills | Status: DC
  Filled 2023-05-27: qty 60, 30d supply, fill #0

## 2023-05-27 MED ORDER — MAGNESIUM SULFATE 2 GM/50ML IV SOLN
2.0000 g | Freq: Once | INTRAVENOUS | Status: AC
  Administered 2023-05-27: 2 g via INTRAVENOUS
  Filled 2023-05-27: qty 50

## 2023-05-27 MED ORDER — AMLODIPINE BESYLATE 10 MG PO TABS
10.0000 mg | ORAL_TABLET | Freq: Every day | ORAL | 6 refills | Status: DC
  Filled 2023-05-27: qty 30, 30d supply, fill #0

## 2023-05-27 MED ORDER — DOFETILIDE 500 MCG PO CAPS: 500.0000 ug | ORAL_CAPSULE | Freq: Two times a day (BID) | ORAL | 5 refills | Status: DC

## 2023-05-27 NOTE — Progress Notes (Signed)
Mobility Specialist Progress Note:   05/27/23 1035  Mobility  Activity Ambulated independently in hallway  Level of Assistance Independent  Assistive Device  (iv pole)  Distance Ambulated (ft) 380 ft  Activity Response Tolerated well  Mobility Referral Yes  $Mobility charge 1 Mobility  Mobility Specialist Start Time (ACUTE ONLY) 0950  Mobility Specialist Stop Time (ACUTE ONLY) 1015  Mobility Specialist Time Calculation (min) (ACUTE ONLY) 25 min    Pre Mobility: 77 HR , 153/81 BP  During Mobility: 66-69 HR  Post Mobility: 74 HR   Pt received in chair, agreeable to mobility. No complaints during ambulation. VSS throughout. Pt returned to chair with all needs met. Family present.   Leory Plowman  Mobility Specialist Please contact via [employer] office at [phone]

## 2023-05-27 NOTE — Discharge Summary (Signed)
ELECTROPHYSIOLOGY PROCEDURE DISCHARGE SUMMARY    Patient ID: Jason Bolton,  MRN: [id number], DOB/AGE: Apr 08, 1956 67 y.o.  Admit date: 05/24/2023 Discharge date: 05/27/2023  Primary Care Physician: Patient, No Pcp Per  Primary Cardiologist: Debbe Odea, MD  Electrophysiologist: Dr. Lalla Brothers  Primary Discharge Diagnosis:  1.  persistent atrial fibrillation status post Tikosyn loading this admission      CHA2DS2Vasc is 2, on Eliquis  Secondary Discharge Diagnosis:  HTN  Allergies  Allergen Reactions   Penicillins Other (See Comments)    Childhood Allergy     Procedures This Admission:  1.  Tikosyn loading 2.  Direct current cardioversion on 05/26/23 by Dr Servando Salina which successfully restored SR.  There were no early apparent complications.   Brief HPI: Jason Bolton is a 67 y.o. male with a past medical history as noted above.  They were referred to EP in the outpatient setting for treatment options of atrial fibrillation.  Risks, benefits, and alternatives to Tikosyn were reviewed with the patient who wished to proceed.    Hospital Course:  The patient was admitted and Tikosyn was initiated.  Renal function and electrolytes were followed during the hospitalization.  The patient's QTc remained stable.  On 05/26/23 the patient underwent direct current cardioversion which restored sinus rhythm.  He was monitored until discharge on telemetry which demonstrated AFib > SR/SB 50's-70's.  On the day of discharge, he feels well, was examined by Dr Lalla Brothers who considered the patient stable for discharge to home.  Follow-up has been arranged with the AFib clinic in 1 week and the EP team in 4 weeks.   Tikosyn teaching was completed No new or additional electrolyte replacement for home Adding amlodipine for BP  Physical Exam: Vitals:   05/26/23 2037 05/26/23 2048 05/27/23 0614 05/27/23 1005  BP:   (!) 153/84 (!) 153/81  Pulse:  75 68 70  Resp:  18 18 16   Temp:   98.2 F  (36.8 C) 98.3 F (36.8 C)  TempSrc:   Oral Oral  SpO2: 97% 97% 99% 99%  Weight:      Height:         GEN- The patient is well appearing, alert and oriented x 3 today.   HEENT: normocephalic, atraumatic; sclera clear, conjunctiva pink; hearing intact; oropharynx clear; neck supple, no JVP Lymph- no cervical lymphadenopathy Lungs- CTA b/l, normal work of breathing.  No wheezes, rales, rhonchi Heart- RRR, no murmurs, rubs or gallops, PMI not laterally displaced GI- soft, non-tender, non-distended Extremities- no clubbing, cyanosis, or edema MS- no significant deformity or atrophy Skin- warm and dry, no rash or lesion Psych- euthymic mood, full affect Neuro- strength and sensation are intact   Labs:   Lab Results  Component Value Date   WBC 10.0 12/22/2022   HGB 15.6 12/22/2022   HCT 54.2 (H) 12/22/2022   MCV 82.1 12/22/2022   PLT 135 (L) 12/22/2022    Recent Labs  Lab 05/27/23 0210  NA 136  K 4.0  CL 99  CO2 27  BUN 13  CREATININE 0.90  CALCIUM 9.0  GLUCOSE 97     Discharge Medications:  Allergies as of 05/27/2023       Reactions   Penicillins Other (See Comments)   Childhood Allergy        Medication List     TAKE these medications    amLODipine 10 MG tablet Commonly known as: NORVASC Take 1 tablet (10 mg total) by mouth daily. Start  taking on: May 28, 2023   dofetilide 500 MCG capsule Commonly known as: TIKOSYN Take 1 capsule (500 mcg total) by mouth 2 (two) times daily.   Eliquis 5 MG Tabs tablet Generic drug: apixaban TAKE 1 TABLET(5 MG) BY MOUTH TWICE DAILY What changed: See the new instructions.   lisinopril 40 MG tablet Commonly known as: ZESTRIL Take 1 tablet (40 mg total) by mouth daily.   Lubricant Eye Drops 0.4-0.3 % Soln Generic drug: Polyethyl Glycol-Propyl Glycol Place 1-2 drops into both eyes daily as needed (dry/irritated eyes).   metoprolol tartrate 50 MG tablet Commonly known as: LOPRESSOR Take 1 tablet (50 mg total)  by mouth 2 (two) times daily.   montelukast 10 MG tablet Commonly known as: SINGULAIR Take 10 mg by mouth daily as needed (allergies).   sildenafil 50 MG tablet Commonly known as: VIAGRA TAKE 1 TABLET BY MOUTH AS NEEDED FOR ERECTILE DYSFUNCTION GENERIC EQUIVALENT FOR VIAGRA What changed:  how much to take how to take this when to take this reasons to take this additional instructions   simvastatin 20 MG tablet Commonly known as: ZOCOR Take 1 tablet (20 mg total) by mouth at bedtime.   tamsulosin 0.4 MG Caps capsule Commonly known as: FLOMAX TAKE 2 CAPSULES BY MOUTH DAILY GENERIC EQUIVALENT FOR FLOMAX What changed:  how much to take how to take this when to take this additional instructions   Ventolin HFA 108 (90 Base) MCG/ACT inhaler Generic drug: albuterol Inhale 2 puffs into the lungs every 4 (four) hours as needed for wheezing.        Disposition: home Discharge Instructions     Diet - low sodium heart healthy   Complete by: As directed    Increase activity slowly   Complete by: As directed         Duration of Discharge Encounter: Greater than 30 minutes including physician time.  Norma Fredrickson, PA-C 05/27/2023 12:21 PM

## 2023-05-27 NOTE — Progress Notes (Signed)
Pharmacy: Dofetilide (Tikosyn) - Follow Up Assessment and Electrolyte Replacement  Pharmacy consulted to assist in monitoring and replacing electrolytes in this 67 y.o. male admitted on 05/24/2023 undergoing dofetilide initiation. First dofetilide dose: 05/24/23@2009 .   Labs:    Component Value Date/Time   K 4.0 05/27/2023 0210   K 4.3 08/18/2012 1343   MG 1.8 05/27/2023 0210     Plan: Potassium: K >/= 4: No additional supplementation needed  Magnesium: Mg 1.8-2: Give Mg 2 gm IV x1   As patient has required on average 10 mEq of potassium replacement every day, recommend discharging patient with prescription for:  Potassium chloride 10 mEq  daily - also has required 8g IV magnesium this admission so would consider discharging on Mag-ox 400 mg twice daily.  Thank you for allowing pharmacy to participate in this patient's care,  Sherron Monday, PharmD, BCCCP Clinical Pharmacist  Phone: [phone] 05/27/2023 7:28 AM  Please check AMION for all [facility] Pharmacy phone numbers After 10:00 PM, call Main Pharmacy [phone]

## 2023-05-27 NOTE — Care Management Important Message (Signed)
Important Message  Patient Details  Name: Jason Bolton MRN: [id number] Date of Birth: 10-09-1956   Medicare Important Message Given:  Yes     Renie Ora 05/27/2023, 9:19 AM

## 2023-06-06 ENCOUNTER — Ambulatory Visit (HOSPITAL_COMMUNITY)
Admission: RE | Admit: 2023-06-06 | Discharge: 2023-06-06 | Disposition: A | Discharge status: Home / Self Care | Payer: BC Managed Care – PPO | Source: Ambulatory Visit | Attending: Physician Assistant | Admitting: Physician Assistant

## 2023-06-06 VITALS — BP 136/72 | HR 57 | Ht 73.0 in | Wt 279.8 lb

## 2023-06-06 DIAGNOSIS — Z7901 Long term (current) use of anticoagulants: Secondary | ICD-10-CM | POA: Diagnosis not present

## 2023-06-06 DIAGNOSIS — E785 Hyperlipidemia, unspecified: Secondary | ICD-10-CM | POA: Insufficient documentation

## 2023-06-06 DIAGNOSIS — D6869 Other thrombophilia: Secondary | ICD-10-CM | POA: Diagnosis not present

## 2023-06-06 DIAGNOSIS — I4819 Other persistent atrial fibrillation: Secondary | ICD-10-CM | POA: Diagnosis not present

## 2023-06-06 DIAGNOSIS — I10 Essential (primary) hypertension: Secondary | ICD-10-CM | POA: Insufficient documentation

## 2023-06-06 DIAGNOSIS — Z79899 Other long term (current) drug therapy: Secondary | ICD-10-CM | POA: Diagnosis not present

## 2023-06-06 DIAGNOSIS — Z5181 Encounter for therapeutic drug level monitoring: Secondary | ICD-10-CM

## 2023-06-06 DIAGNOSIS — Z6836 Body mass index (BMI) 36.0-36.9, adult: Secondary | ICD-10-CM | POA: Diagnosis not present

## 2023-06-06 DIAGNOSIS — E669 Obesity, unspecified: Secondary | ICD-10-CM | POA: Insufficient documentation

## 2023-06-06 LAB — BASIC METABOLIC PANEL
Anion gap: 7 (ref 5–15)
BUN: 13 mg/dL (ref 8–23)
CO2: 31 mmol/L (ref 22–32)
Calcium: 9.4 mg/dL (ref 8.9–10.3)
Chloride: 100 mmol/L (ref 98–111)
Creatinine, Ser: 0.9 mg/dL (ref 0.61–1.24)
GFR, Estimated: >60 mL/min (ref >60)
Glucose, Bld: 111 mg/dL — ABNORMAL HIGH (ref 70–99)
Potassium: 4.6 mmol/L (ref 3.5–5.1)
Sodium: 138 mmol/L (ref 135–145)

## 2023-06-06 LAB — MAGNESIUM: Magnesium: 2 mg/dL (ref 1.7–2.4)

## 2023-06-06 MED ORDER — AMLODIPINE BESYLATE 10 MG PO TABS: 10.0000 mg | ORAL_TABLET | Freq: Every day | ORAL | 1 refills | Status: AC

## 2023-06-06 MED ORDER — DOFETILIDE 500 MCG PO CAPS: 500.0000 ug | ORAL_CAPSULE | Freq: Two times a day (BID) | ORAL | 1 refills | Status: AC

## 2023-06-06 MED ORDER — DOFETILIDE 500 MCG PO CAPS: 500.0000 ug | ORAL_CAPSULE | Freq: Two times a day (BID) | ORAL | 6 refills | Status: DC

## 2023-06-06 NOTE — Addendum Note (Signed)
Encounter addended by: Learta Codding, CMA on: 06/06/2023 4:27 PM  Actions taken: Pharmacy for encounter modified, Order list changed

## 2023-06-06 NOTE — Progress Notes (Signed)
   Primary Care Physician: Patient, No Pcp Per Referring Physician: Dr. Lalla Brothers Primary EP: Dr Lalla Brothers Primary Cardiologist: Dr Ellery Plunk Jason Bolton is a 67 y.o. male with a h/o HTN, HLD, AAA, afib that who presents for follow up in the [facility]. Patient underwent afib ablation on 04/29/22 with Dr Lalla Brothers but had recurrence of his afib and had a DCCV on 10/04/22. On follow up, he was back in afib and underwent repeat ablation on 01/14/23. Patient saw Dr Lalla Brothers in follow up 04/20/23 and was back in afib. Dofetilide was recommended.   On follow up today, patient is s/p dofetilide admission 6/25-6/28/24 with DCCV on 05/26/23. He reports today that he feels "great". He remains in SR. Amlodipine was also added during his admission for HTN. No bleeding issues on anticoagulation.   Today, he denies symptoms of palpitations, chest pain, shortness of breath, orthopnea, PND, lower extremity edema, dizziness, presyncope, syncope, or neurologic sequela. The patient is tolerating medications without difficulties and is otherwise without complaint today.   Past Medical History:  Diagnosis Date   Allergy    Dysrhythmia    Hypercholesterolemia    Hypertension    Personal history of osteomyelitis    of the jaw- 2005-mandibular surgery   Polycythemia    Sleep apnea     ROS- All systems are reviewed and negative except as per the HPI above  Physical Exam: Vitals:   06/06/23 0907  BP: 136/72  Pulse: (!) 57  Weight: 126.9 kg  Height: 6\' 1"  (1.854 m)    Wt Readings from Last 3 Encounters:  06/06/23 126.9 kg  05/24/23 128.7 kg  05/24/23 128.5 kg    GEN: Well nourished, well developed in no acute distress NECK: No JVD; No carotid bruits CARDIAC: Regular rate and rhythm, no murmurs, rubs, gallops RESPIRATORY:  Clear to auscultation without rales, wheezing or rhonchi  ABDOMEN: Soft, non-tender, non-distended EXTREMITIES:  No edema; No deformity    EKG today  demonstrates  SB, 1st degree AV block Vent. rate 57 BPM PR interval 202 ms QRS duration 84 ms QT/QTcB 448/436 ms   CHA2DS2-VASc Score = 2  The patient's score is based upon: CHF History: 0 HTN History: 1 Diabetes History: 0 Stroke History: 0 Vascular Disease History: 0 Age Score: 1 Gender Score: 0       ASSESSMENT AND PLAN: Persistent Atrial Fibrillation (ICD10:  I48.19) The patient's CHA2DS2-VASc score is 2, indicating a 2.2% annual risk of stroke.   S/p afib ablation 04/29/22 with repeat ablation 01/24/23 S/p dofetilide loading 6/25-6/28/24 with DCCV 05/26/23 Patient appears to be maintaining SR. Continue dofetilide 500 mg BID. QT stable Check bmet/mag today Continue Eliquis 5 mg BID Continue Lopressor 50 mg BID  Secondary Hypercoagulable State (ICD10:  D68.69) The patient is at significant risk for stroke/thromboembolism based upon his CHA2DS2-VASc Score of 2.  Continue Apixaban (Eliquis).   HTN Stable on current regimen  Obesity Body mass index is 36.92 kg/m.  Encouraged lifestyle modification   Follow up in the [place] office in one month per patient preference. Long term follow up with cardiology in [facility], referral sent.    Jorja Loa PA-C Afib Clinic [facility] [address] [place], [place] [address] [phone]

## 2023-07-01 DIAGNOSIS — D751 Secondary polycythemia: Secondary | ICD-10-CM | POA: Diagnosis not present

## 2023-07-04 ENCOUNTER — Ambulatory Visit: Payer: BC Managed Care – PPO | Admitting: Cardiology

## 2023-07-13 NOTE — Progress Notes (Unsigned)
  Electrophysiology Office Follow up Visit Note:    Date:  07/14/2023   ID:  Jason Bolton, DOB 10/05/1956, MRN [id number]  PCP:  Johnny Bridge, DO  [facility] Cardiologist:  Debbe Odea, MD  [facility] Electrophysiologist:  Lanier Prude, MD    Interval History:    Jason Bolton is a 67 y.o. male who presents for a follow up visit.   By Clide Cliff on June 06, 2023.  The patient has had 2 prior ablations (April 29, 2022 and January 14, 2023).  He continues to have symptomatic atrial fibrillation.  I started him on dofetilide in June.  When he saw Clide Cliff on July 8 he was maintaining sinus rhythm and felt well.  He takes Eliquis for stroke prophylaxis. He has been doing well.  No recurrence of arrhythmia.  Feeling much better.      Past medical, surgical, social and family history were reviewed.  ROS:   Please see the history of present illness.    All other systems reviewed and are negative.  EKGs/Labs/Other Studies Reviewed:    The following studies were reviewed today:         Physical Exam:    VS:  BP 114/64 (BP Location: Left Arm, Patient Position: Sitting, Cuff Size: Large)   Pulse (!) 51   Ht 6\' 1"  (1.854 m)   Wt 284 lb 9.6 oz (129.1 kg)   SpO2 92%   BMI 37.55 kg/m     Wt Readings from Last 3 Encounters:  07/14/23 284 lb 9.6 oz (129.1 kg)  06/06/23 279 lb 12.8 oz (126.9 kg)  05/24/23 283 lb 12.8 oz (128.7 kg)     GEN:  Well nourished, well developed in no acute distress CARDIAC: RRR, no murmurs, rubs, gallops RESPIRATORY:  Clear to auscultation without rales, wheezing or rhonchi       ASSESSMENT:    1. Persistent atrial fibrillation (HCC)   2. Primary hypertension   3. Encounter for long-term current use of high risk medication    PLAN:    In order of problems listed above:  #Persistent atrial fibrillation #High risk med monitoring-dofetilide Maintaining sinus rhythm after 2 ablations and on Tikosyn. Continue  Eliquis Continue Tikosyn 500 mcg by mouth twice daily QTc stable on today's EKG  He is establishing with Nestor Ramp, MD in [place] next month.  #Hypertension At goal today.  Recommend checking blood pressures 1-2 times per week at home and recording the values.  Recommend bringing these recordings to the primary care physician.  Follow-up arranged next month in [place].  Follow-up with me as needed.  Signed, Steffanie Dunn, MD, [facility], [facility] 07/14/2023 10:04 AM    Electrophysiology [facility]

## 2023-07-14 ENCOUNTER — Ambulatory Visit: Payer: BC Managed Care – PPO | Attending: Cardiology | Admitting: Cardiology

## 2023-07-14 ENCOUNTER — Encounter: Payer: Self-pay | Admitting: Cardiology

## 2023-07-14 VITALS — BP 114/64 | HR 51 | Ht 73.0 in | Wt 284.6 lb

## 2023-07-14 DIAGNOSIS — I10 Essential (primary) hypertension: Secondary | ICD-10-CM | POA: Diagnosis not present

## 2023-07-14 DIAGNOSIS — Z79899 Other long term (current) drug therapy: Secondary | ICD-10-CM | POA: Diagnosis not present

## 2023-07-14 DIAGNOSIS — I4819 Other persistent atrial fibrillation: Secondary | ICD-10-CM | POA: Diagnosis not present

## 2023-07-14 NOTE — Patient Instructions (Signed)
Medication Instructions:  Your physician recommends that you continue on your current medications as directed. Please refer to the Current Medication list given to you today.  *If you need a refill on your cardiac medications before your next appointment, please call your pharmacy*  Follow-Up: At [facility], you and your health needs are our priority.  As part of our continuing mission to provide you with exceptional heart care, we have created designated Provider Care Teams.  These Care Teams include your primary Cardiologist (physician) and Advanced Practice Providers (APPs -  Physician Assistants and Nurse Practitioners) who all work together to provide you with the care you need, when you need it.  Your next appointment:   As needed with Dr. Lambert 

## 2023-08-26 DIAGNOSIS — D751 Secondary polycythemia: Secondary | ICD-10-CM | POA: Diagnosis not present

## 2023-09-12 DIAGNOSIS — I10 Essential (primary) hypertension: Secondary | ICD-10-CM | POA: Diagnosis not present

## 2023-09-12 DIAGNOSIS — L259 Unspecified contact dermatitis, unspecified cause: Secondary | ICD-10-CM | POA: Diagnosis not present

## 2023-09-12 DIAGNOSIS — E785 Hyperlipidemia, unspecified: Secondary | ICD-10-CM | POA: Diagnosis not present

## 2023-09-12 DIAGNOSIS — D751 Secondary polycythemia: Secondary | ICD-10-CM | POA: Diagnosis not present

## 2023-09-30 DIAGNOSIS — H16143 Punctate keratitis, bilateral: Secondary | ICD-10-CM | POA: Diagnosis not present

## 2023-10-05 DIAGNOSIS — Z9889 Other specified postprocedural states: Secondary | ICD-10-CM | POA: Diagnosis not present

## 2023-10-05 DIAGNOSIS — I10 Essential (primary) hypertension: Secondary | ICD-10-CM | POA: Diagnosis not present

## 2023-10-05 DIAGNOSIS — I4819 Other persistent atrial fibrillation: Secondary | ICD-10-CM | POA: Diagnosis not present

## 2023-10-05 DIAGNOSIS — Z7901 Long term (current) use of anticoagulants: Secondary | ICD-10-CM | POA: Diagnosis not present

## 2023-10-10 DIAGNOSIS — E7800 Pure hypercholesterolemia, unspecified: Secondary | ICD-10-CM | POA: Diagnosis not present

## 2023-10-10 DIAGNOSIS — I480 Paroxysmal atrial fibrillation: Secondary | ICD-10-CM | POA: Diagnosis not present

## 2023-10-10 DIAGNOSIS — R001 Bradycardia, unspecified: Secondary | ICD-10-CM | POA: Diagnosis not present

## 2023-10-10 DIAGNOSIS — I4819 Other persistent atrial fibrillation: Secondary | ICD-10-CM | POA: Diagnosis not present

## 2023-10-10 DIAGNOSIS — Z87891 Personal history of nicotine dependence: Secondary | ICD-10-CM | POA: Diagnosis not present

## 2023-10-10 DIAGNOSIS — I10 Essential (primary) hypertension: Secondary | ICD-10-CM | POA: Diagnosis not present

## 2023-10-20 DIAGNOSIS — I480 Paroxysmal atrial fibrillation: Secondary | ICD-10-CM | POA: Diagnosis not present

## 2023-10-21 DIAGNOSIS — D751 Secondary polycythemia: Secondary | ICD-10-CM | POA: Diagnosis not present

## 2023-11-01 ENCOUNTER — Other Ambulatory Visit: Payer: Self-pay | Admitting: Family Medicine

## 2023-11-01 DIAGNOSIS — N400 Enlarged prostate without lower urinary tract symptoms: Secondary | ICD-10-CM

## 2023-11-01 NOTE — Telephone Encounter (Signed)
Please advise 

## 2024-01-28 ENCOUNTER — Other Ambulatory Visit: Payer: Self-pay | Admitting: Cardiology

## 2024-02-26 ENCOUNTER — Other Ambulatory Visit: Payer: Self-pay | Admitting: Cardiology

## 2024-02-27 NOTE — Telephone Encounter (Deleted)
 Prescription refill request for Eliquis received. Indication:afib Last office visit: Scr: Age:  Weight:

## 2024-04-02 ENCOUNTER — Encounter: Payer: Self-pay | Admitting: Oncology
